# Patient Record
Sex: Male | Born: 1965 | Race: White | Hispanic: No | Marital: Single | Smoking: Former smoker
Health system: Southern US, Community
[De-identification: ages and names within clinical notes are randomized; demographics above are authoritative.]

## PROBLEM LIST (undated history)

## (undated) DIAGNOSIS — R918 Other nonspecific abnormal finding of lung field: Secondary | ICD-10-CM

## (undated) DIAGNOSIS — N529 Male erectile dysfunction, unspecified: Secondary | ICD-10-CM

## (undated) DIAGNOSIS — I712 Thoracic aortic aneurysm, without rupture: Secondary | ICD-10-CM

## (undated) DIAGNOSIS — Z720 Tobacco use: Secondary | ICD-10-CM

## (undated) DIAGNOSIS — R0789 Other chest pain: Secondary | ICD-10-CM

## (undated) DIAGNOSIS — Z973 Presence of spectacles and contact lenses: Secondary | ICD-10-CM

## (undated) DIAGNOSIS — R739 Hyperglycemia, unspecified: Secondary | ICD-10-CM

## (undated) DIAGNOSIS — I7121 Aneurysm of the ascending aorta, without rupture: Secondary | ICD-10-CM

## (undated) DIAGNOSIS — J45909 Unspecified asthma, uncomplicated: Secondary | ICD-10-CM

## (undated) DIAGNOSIS — E7800 Pure hypercholesterolemia, unspecified: Secondary | ICD-10-CM

## (undated) DIAGNOSIS — F419 Anxiety disorder, unspecified: Secondary | ICD-10-CM

## (undated) DIAGNOSIS — Z2821 Immunization not carried out because of patient refusal: Secondary | ICD-10-CM

## (undated) DIAGNOSIS — I10 Essential (primary) hypertension: Secondary | ICD-10-CM

## (undated) DIAGNOSIS — K219 Gastro-esophageal reflux disease without esophagitis: Secondary | ICD-10-CM

## (undated) DIAGNOSIS — E669 Obesity, unspecified: Secondary | ICD-10-CM

## (undated) HISTORY — DX: Hyperglycemia, unspecified: R73.9

## (undated) HISTORY — PX: EYE SURGERY: SHX253

## (undated) HISTORY — DX: Pure hypercholesterolemia, unspecified: E78.00

## (undated) HISTORY — PX: WISDOM TOOTH EXTRACTION: SHX21

## (undated) HISTORY — DX: Thoracic aortic aneurysm, without rupture: I71.2

## (undated) HISTORY — DX: Aneurysm of the ascending aorta, without rupture: I71.21

## (undated) HISTORY — DX: Male erectile dysfunction, unspecified: N52.9

## (undated) HISTORY — DX: Essential (primary) hypertension: I10

## (undated) HISTORY — DX: Unspecified asthma, uncomplicated: J45.909

## (undated) HISTORY — DX: Immunization not carried out because of patient refusal: Z28.21

## (undated) HISTORY — DX: Other chest pain: R07.89

## (undated) HISTORY — DX: Presence of spectacles and contact lenses: Z97.3

## (undated) HISTORY — DX: Obesity, unspecified: E66.9

## (undated) HISTORY — DX: Anxiety disorder, unspecified: F41.9

## (undated) HISTORY — DX: Tobacco use: Z72.0

## (undated) HISTORY — DX: Other nonspecific abnormal finding of lung field: R91.8

---

## 2006-02-09 LAB — HM COLONOSCOPY: HM Colonoscopy: 2

## 2007-11-24 HISTORY — PX: COLONOSCOPY: SHX174

## 2007-11-24 HISTORY — PX: ESOPHAGOGASTRODUODENOSCOPY: SHX1529

## 2008-08-08 ENCOUNTER — Encounter: Admission: RE | Admit: 2008-08-08 | Discharge: 2008-08-08 | Payer: Self-pay | Admitting: Internal Medicine

## 2008-08-08 IMAGING — US US ABDOMEN COMPLETE
1 series · 14 of 25 positions shown · non-contrast
Comparison: none

CLINICAL DATA: LUQ PAIN

ABDOMEN ULTRASOUND
TECHNIQUE: Complete abdominal ultrasound examination was performed
including evaluation of the liver, gallbladder, bile ducts,
pancreas, kidneys, spleen, IVC, and abdominal aorta.

[Series 1: us abdomen complete · 0.29mm/px · 89 acquisitions, 14 frames shown]
[im 1/89]
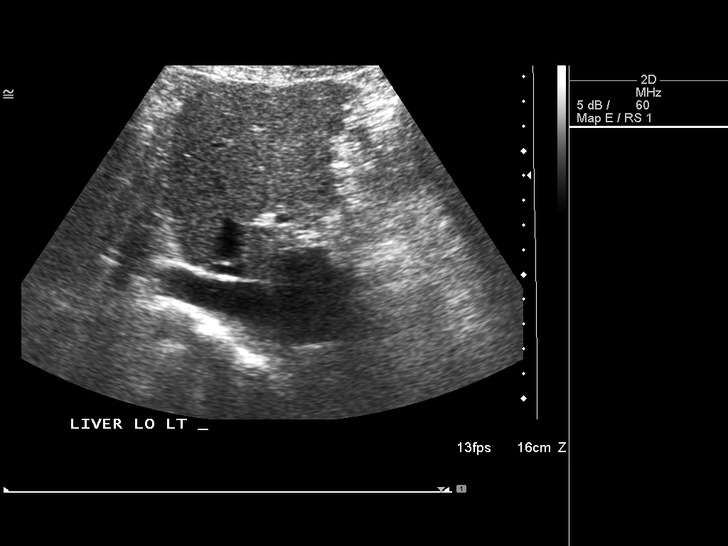
[im 8/89]
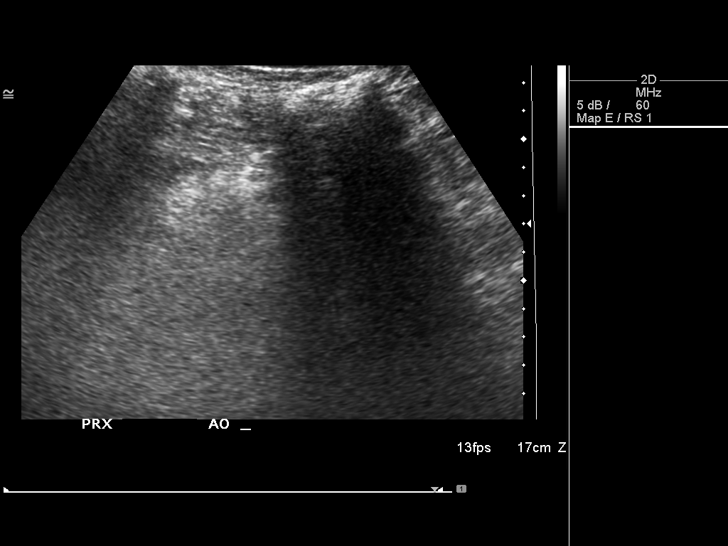
[im 15/89]
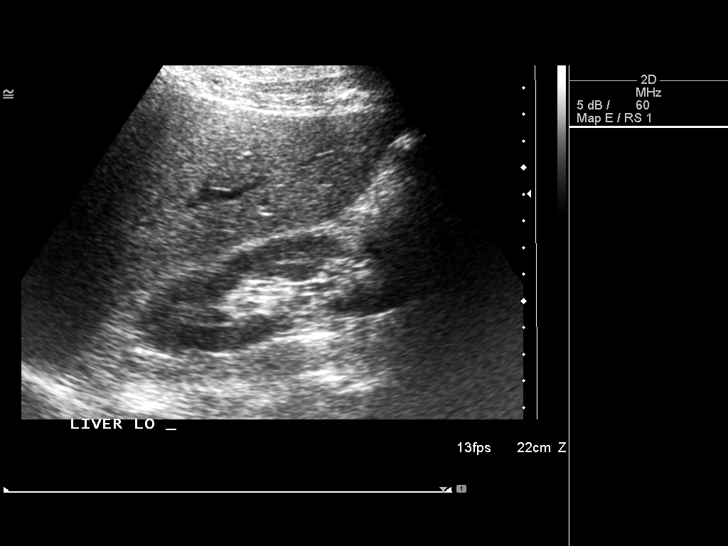
[im 23/89]
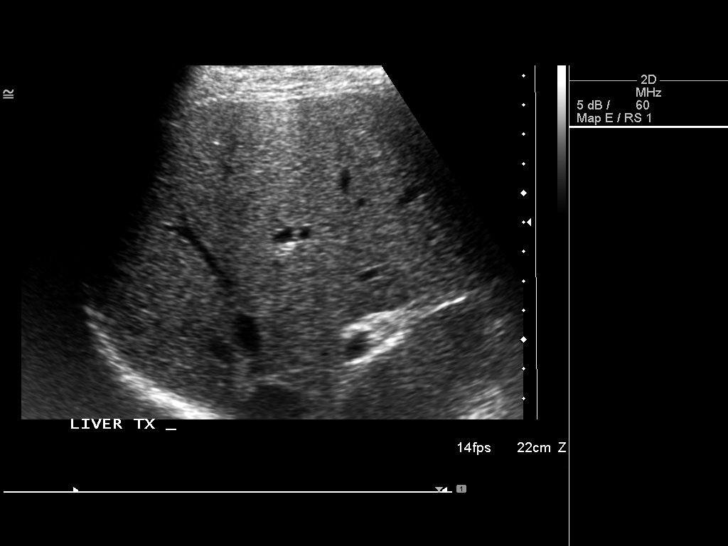
[im 30/89]
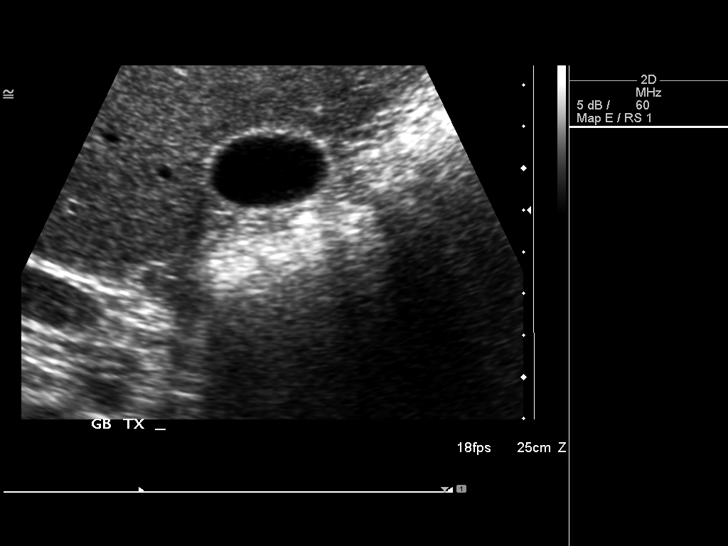
[im 34/89]
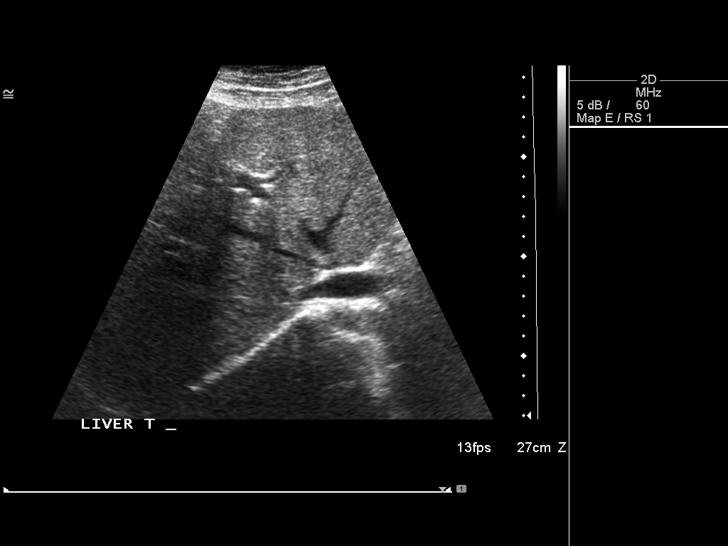
[im 41/89]
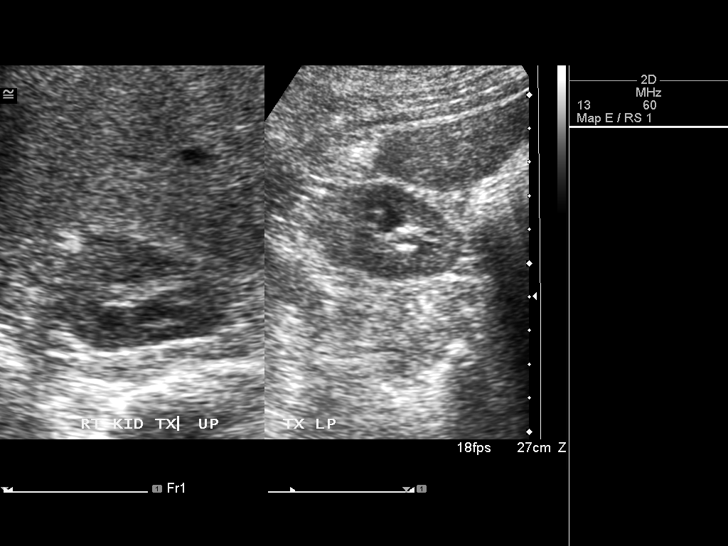
[im 48/89]
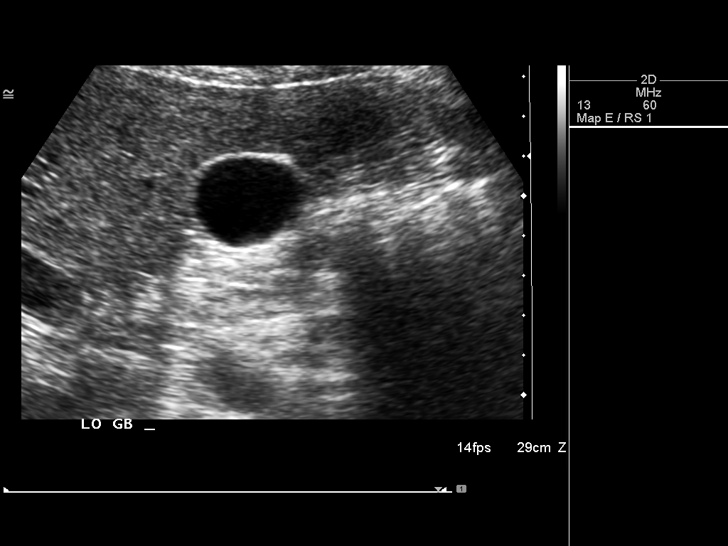
[im 56/89]
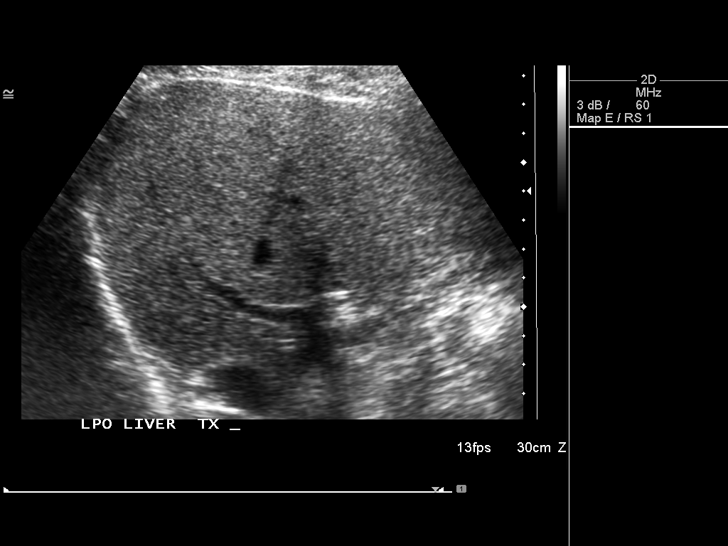
[im 59/89]
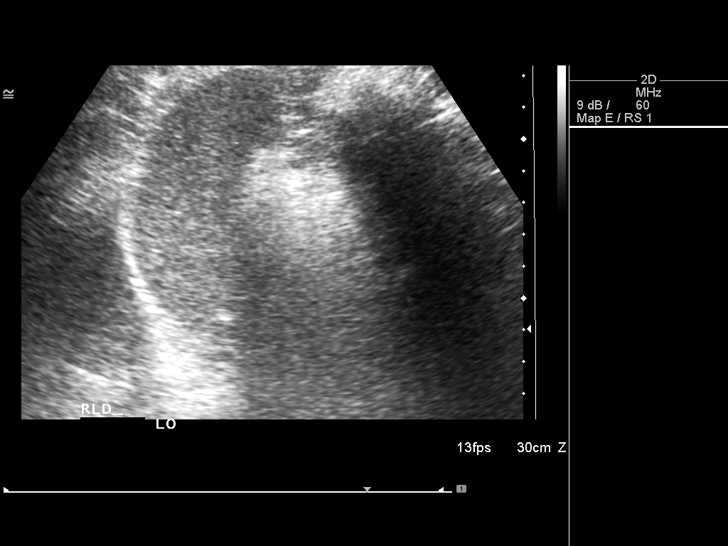
[im 67/89]
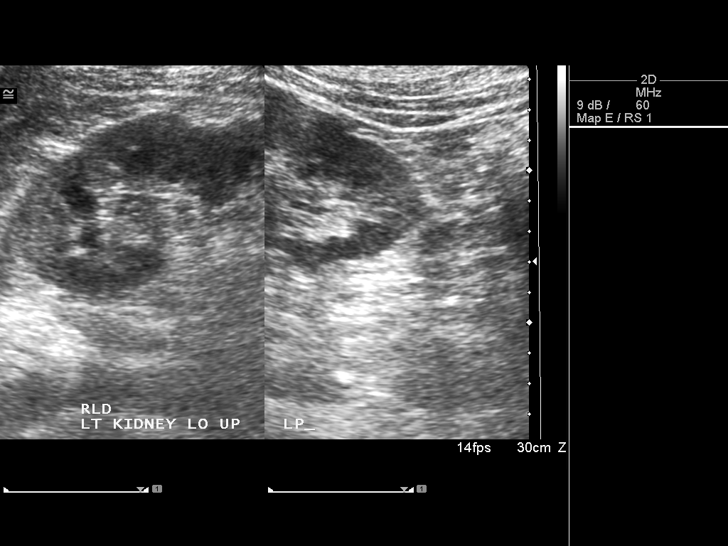
[im 74/89]
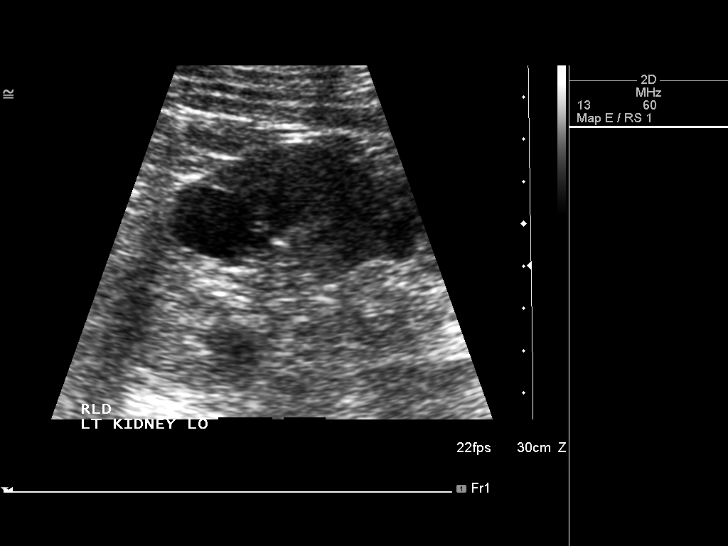
[im 81/89]
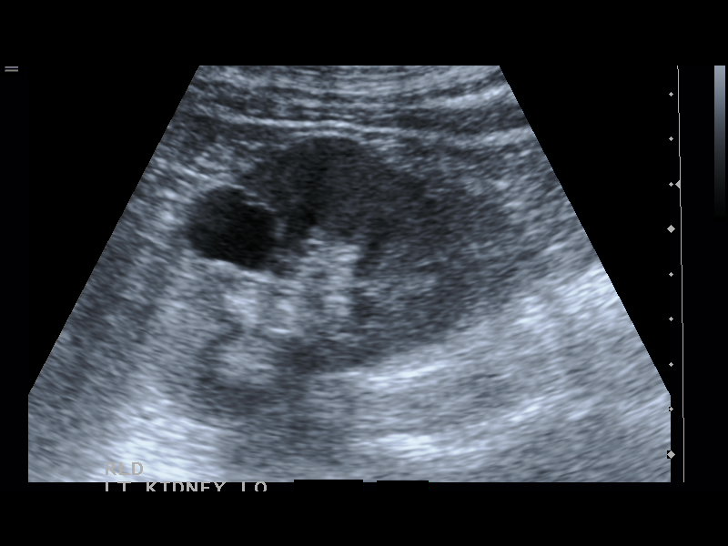
[im 89/89]
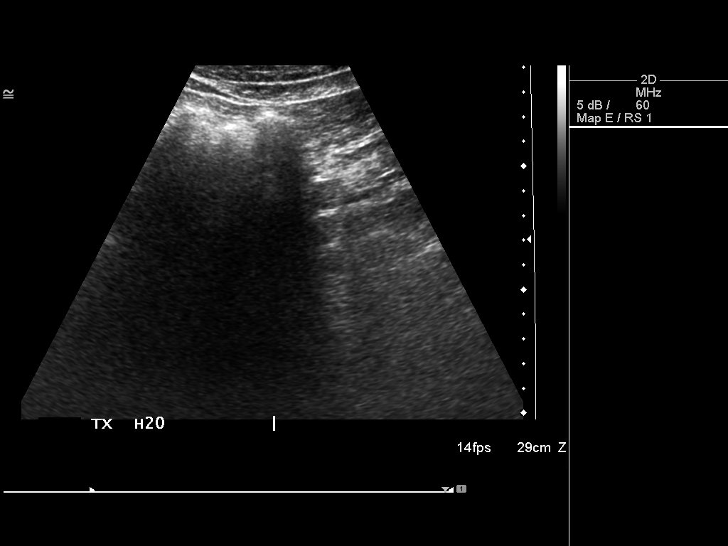

[14 of 25 positions shown; findings below may reference images not displayed]

FINDINGS: Gallbladder:  No gallstones.  No gallbladder wall thickening or
pericholecystic fluid. Negative sonographic Murphy's sign per the
ultrasound technologist.

Common bile duct: Normal in caliber.

Liver:  Normal size and echotexture.  No focal parenchymal
abnormalities.

Inferior vena cava:  Patent.

Pancreas:  Visualized portions unremarkable.

Spleen:  Normal size and echotexture without focal parenchymal
abnormalities.

Right kidney:  No hydronephrosis.   Normal parenchymal echotexture
without focal abnormalities.

Left kidney:  No hydronephrosis. Normal parenchymal echotexture
with a 16 x 19 x 29 mm cyst in its upper pole.

Abdominal aorta:  Visualized portions normal in caliber,
unremarkable..
IMPRESSION: 1.  Left renal cyst.
2.  Otherwise normal abdominal ultrasound.

## 2010-01-30 ENCOUNTER — Ambulatory Visit: Payer: Self-pay | Admitting: Family Medicine

## 2010-01-30 DIAGNOSIS — R112 Nausea with vomiting, unspecified: Secondary | ICD-10-CM

## 2010-01-30 DIAGNOSIS — R197 Diarrhea, unspecified: Secondary | ICD-10-CM | POA: Insufficient documentation

## 2010-04-08 ENCOUNTER — Ambulatory Visit: Payer: Self-pay | Admitting: Family Medicine

## 2010-04-08 DIAGNOSIS — R238 Other skin changes: Secondary | ICD-10-CM

## 2010-12-23 NOTE — Assessment & Plan Note (Signed)
Summary: Bug bite - L calf x 1 dy rm 3   Vital Signs:  Patient Profile:   45 Years Old Male CC:      Bug bite x 1 dy Height:     71 inches Weight:      193 pounds O2 Sat:      100 % O2 treatment:    Room Air Temp:     98.0 degrees F oral Pulse rate:   79 / minute Pulse rhythm:   regular Resp:     16 per minute BP sitting:   149 / 93  (right arm) Cuff size:   regular  Vitals Entered By: Areta Haber CMA (Apr 08, 2010 8:46 AM)                  Current Allergies: No known allergies History of Present Illness Chief Complaint: Bug bite x 1 dy History of Present Illness: Subjective:  Patient noticed an area of bruising on his left mid-calf yesterday without any known preceding trauma.  The area has been very mildly tender but without itching, warmth or swelling. No pain when walking.  On close inspection he noticed two tiny red spots near the center that were suggestive of an insect bite.  However, he recalls no bites, and no tick exposure.  He feels completely well.  He does take low dose aspirin.  He believes that his tetanus immunization is current.  Current Problems: ECCHYMOSES (ICD-782.9) NAUSEA WITH VOMITING (ICD-787.01) DIARRHEA, ACUTE (ICD-787.91)   Current Meds PROTONIX 40 MG SOLR (PANTOPRAZOLE SODIUM) 1 tab by mouth once daily ASPIR-LOW 81 MG TBEC (ASPIRIN)   REVIEW OF SYSTEMS Constitutional Symptoms      Denies fever, chills, night sweats, weight loss, weight gain, and fatigue.  Eyes       Denies change in vision, eye pain, eye discharge, glasses, contact lenses, and eye surgery. Ear/Nose/Throat/Mouth       Denies hearing loss/aids, change in hearing, ear pain, ear discharge, dizziness, frequent runny nose, frequent nose bleeds, sinus problems, sore throat, hoarseness, and tooth pain or bleeding.  Respiratory       Denies dry cough, productive cough, wheezing, shortness of breath, asthma, bronchitis, and emphysema/COPD.  Cardiovascular       Denies murmurs,  chest pain, and tires easily with exhertion.    Gastrointestinal       Denies stomach pain, nausea/vomiting, diarrhea, constipation, blood in bowel movements, and indigestion. Genitourniary       Denies painful urination, kidney stones, and loss of urinary control. Neurological       Denies paralysis, seizures, and fainting/blackouts. Musculoskeletal       Denies muscle pain, joint pain, joint stiffness, decreased range of motion, redness, swelling, muscle weakness, and gout.  Skin       Complains of bruising.      Denies unusual mles/lumps or sores and hair/skin or nail changes.  Psych       Denies mood changes, temper/anger issues, anxiety/stress, speech problems, depression, and sleep problems. Other Comments: Bug Bite L Calf x 1 dy.   Past History:  Past Medical History: Last updated: 01/30/2010 Unremarkable  Past Surgical History: Last updated: 01/30/2010 Denies surgical history  Social History: Last updated: 01/30/2010 Single Current Smoker -  Alcohol use-no Drug use-no Regular exercise-no  Risk Factors: Exercise: no (01/30/2010)  Risk Factors: Smoking Status: current (01/30/2010)   Objective:  Appearance:  Patient appears healthy, stated age, and in no acute distress  Neck:  Supple.  No adenopathy is present.   Lungs:  Clear to auscultation.  Breath sounds are equal.  Heart:  Regular rate and rhythm without murmurs, rubs, or gallops.  Abdomen:  Nontender without masses or hepatosplenomegaly.  Bowel sounds are present.  No CVA or flank tenderness.  Extremities:  No edema.  Pedal pulses are full and equal.  Left mid-calf medially reveals an area of very faint ecchymosis about 10cm dia.  Near the center of the ecchymosis are two tiny (1- 2mm) erythematous macules about 3mm apart suggestive of a recent insect bite.  Immediately around these two spots are mild tenderness and swelling, but no erythema or warmth.  No drainage.  No generalized calf tenderness or  swelling.  Homan's test negative. Assessment New Problems: ECCHYMOSES (ICD-782.9)  SUSPECT MINIOR INSECT BITE WITH LOCAL TOXIN EFFECT.   ECCHYMOSIS AND SWELLING INTENSIFIED BY PATIENT'S ASPIRIN USE.  NO EVIDENCE OF CELLULITIS  Plan New Orders: Est. Patient Level III [56213] Planning Comments:   Reassurance.  May apply Bacitracin to small lesions until healed.  May apply heating pad two or three times daily. Return for follow-up if develops systemic symptoms (fever, headache, arthralgias, malaise, etc) or signs of local infection:  redness, heat, increased pain, etc.   The patient and/or caregiver has been counseled thoroughly with regard to medications prescribed including dosage, schedule, interactions, rationale for use, and possible side effects and they verbalize understanding.  Diagnoses and expected course of recovery discussed and will return if not improved as expected or if the condition worsens. Patient and/or caregiver verbalized understanding.

## 2010-12-23 NOTE — Letter (Signed)
Summary: Out of Work  [facility]  [address]   [place], [place] [address]   Phone: [phone]  Fax: [phone]    January 30, 2010   Employee:  Isahia Pillard    To Whom It May Concern:   For Medical reasons, please excuse the above named employee from work for the following dates:  Start:   30 Jan 2010  End:   01 Feb 2010  If you need additional information, please feel free to contact our office.         Sincerely,    Marvis Moeller DO

## 2010-12-23 NOTE — Assessment & Plan Note (Signed)
Summary: VOMITING/KH   Vital Signs:  Patient Profile:   45 Years Old Male CC:      Vomitin, naseau, dizzy x today Height:     71 inches Weight:      188 pounds O2 Sat:      98 % O2 treatment:    Room Air Temp:     97.1 degrees F oral Pulse rate:   83 / minute Pulse (ortho):   87 / minute Pulse rhythm:   regular Resp:     18 per minute BP sitting:   112 / 67  (right arm) BP standing:   104 / 62 Cuff size:   regular  Vitals Entered By: Areta Haber CMA (January 30, 2010 3:25 PM)                 Serial Vital Signs/Assessments:  Time      Position  BP       Pulse  Resp  Temp     By           Lying RA  94/55    88                    Areta Haber CMA           Sitting   112/72   80                    Areta Haber CMA           Standing  104/62   87                    [address] CMA    Current Allergies: No known allergies History of Present Illness Chief Complaint: Vomitin, naseau, dizzy x today History of Present Illness: ONSET 7 AM N/V. NO FEVER. NO BLOOD IN STOOL OR EMISIS. WAS EXPOSED TO SIMILAR SYMPTOMS YESTERDAY BY CO WORKERS.   Current Problems: NAUSEA WITH VOMITING (ICD-787.01) DIARRHEA, ACUTE (ICD-787.91)   Current Meds ZOFRAN 4 MG TABS (ONDANSETRON HCL) 1 TAB Q 6-8 HRS as needed N/V  REVIEW OF SYSTEMS Constitutional Symptoms      Denies fever, chills, night sweats, weight loss, weight gain, and fatigue.  Eyes       Denies change in vision, eye pain, eye discharge, glasses, contact lenses, and eye surgery. Ear/Nose/Throat/Mouth       Complains of dizziness.      Denies hearing loss/aids, change in hearing, ear pain, ear discharge, frequent runny nose, frequent nose bleeds, sinus problems, sore throat, hoarseness, and tooth pain or bleeding.      Comments: x today Respiratory       Denies dry cough, productive cough, wheezing, shortness of breath, asthma, bronchitis, and emphysema/COPD.  Cardiovascular       Denies murmurs, chest pain,  and tires easily with exhertion.    Gastrointestinal       Complains of nausea/vomiting and diarrhea.      Denies stomach pain, constipation, blood in bowel movements, and indigestion. Genitourniary       Denies painful urination, kidney stones, and loss of urinary control. Neurological       Denies paralysis, seizures, and fainting/blackouts. Musculoskeletal       Denies muscle pain, joint pain, joint stiffness, decreased range of motion, redness, swelling, muscle weakness, and gout.  Skin       Denies bruising, unusual mles/lumps or sores, and hair/skin or nail changes.  Psych  Denies mood changes, temper/anger issues, anxiety/stress, speech problems, depression, and sleep problems.  Past History:  Past Medical History: Unremarkable  Past Surgical History: Denies surgical history  Social History: Single Current Smoker -  Alcohol use-no Drug use-no Regular exercise-no Smoking Status:  current Drug Use:  no Does Patient Exercise:  no Physical Exam General appearance: well developed, well nourished, no acute distress Oral/Pharynx: tongue normal, posterior pharynx without erythema or exudate Neck: neck supple,  trachea midline, no masses Chest/Lungs: no rales, wheezes, or rhonchi bilateral, breath sounds equal without effort Heart: regular rate and  rhythm, no murmur Abdomen: soft, non-tender without obvious organomegaly BS HYPERACTIVE Extremities: normal extremities Skin: no obvious rashes or lesions ORTHO VS NEG Assessment New Problems: NAUSEA WITH VOMITING (ICD-787.01) DIARRHEA, ACUTE (ICD-787.91)   Plan New Medications/Changes: ZOFRAN 4 MG TABS (ONDANSETRON HCL) 1 TAB Q 6-8 HRS as needed N/V  ##8 x 0, 01/30/2010, Marvis Moeller DO  New Orders: New Patient Level III [99203] Zofran 1mg . injection [J2405] Admin of Therapeutic Inj  intramuscular or subcutaneous [96372]   Prescriptions: ZOFRAN 4 MG TABS (ONDANSETRON HCL) 1 TAB Q 6-8 HRS as needed N/V  ##8  x 0   Entered and Authorized by:   Marvis Moeller DO   Signed by:   Marvis Moeller DO on 01/30/2010   Method used:   Print then Give to Patient   RxID:   [id number]   Patient Instructions: 1)  CLEAR LIQUIDS . AVOID CAFFEINE AND MILK PRODUCTS. DIET AS DISCUSSED. REST. CONTINUE FLUIDS AND DIET UNTIL FORM TO YOUR STOOL.   Medication Administration  Injection # 1:    Medication: Zofran 1mg . injection    Diagnosis: NAUSEA WITH VOMITING (ICD-787.01)    Route: IM    Site: RUOQ gluteus    Exp Date: 06/24/2011    Lot #: [id number]    Mfr: Hospira    Comments: Administered 4mg     Patient tolerated injection without complications    Given by: Areta Haber CMA (January 30, 2010 5:41 PM)  Orders Added: 1)  New Patient Level III [99203] 2)  Zofran 1mg . injection [J2405] 3)  Admin of Therapeutic Inj  intramuscular or subcutaneous [14782]

## 2011-10-22 DIAGNOSIS — H5010 Unspecified exotropia: Secondary | ICD-10-CM | POA: Insufficient documentation

## 2011-10-22 DIAGNOSIS — H52209 Unspecified astigmatism, unspecified eye: Secondary | ICD-10-CM | POA: Insufficient documentation

## 2011-10-22 DIAGNOSIS — H02409 Unspecified ptosis of unspecified eyelid: Secondary | ICD-10-CM | POA: Insufficient documentation

## 2011-11-09 ENCOUNTER — Emergency Department (INDEPENDENT_AMBULATORY_CARE_PROVIDER_SITE_OTHER)
Admission: EM | Admit: 2011-11-09 | Discharge: 2011-11-09 | Disposition: A | Discharge status: Home / Self Care | Payer: BC Managed Care – PPO | Source: Home / Self Care | Attending: Emergency Medicine | Admitting: Emergency Medicine

## 2011-11-09 ENCOUNTER — Encounter: Payer: Self-pay | Admitting: *Deleted

## 2011-11-09 DIAGNOSIS — J0100 Acute maxillary sinusitis, unspecified: Secondary | ICD-10-CM

## 2011-11-09 HISTORY — DX: Gastro-esophageal reflux disease without esophagitis: K21.9

## 2011-11-09 MED ORDER — AMOXICILLIN 500 MG PO CAPS: 500.0000 mg | ORAL_CAPSULE | Freq: Three times a day (TID) | ORAL | Status: AC

## 2011-11-09 MED ORDER — FLUTICASONE PROPIONATE 50 MCG/ACT NA SUSP: NASAL | Status: DC

## 2011-11-09 NOTE — ED Provider Notes (Signed)
History     CSN: [id number] Arrival date & time: 11/09/2011 12:49 PM   First MD Initiated Contact with Patient 11/09/11 1305      Chief Complaint  Patient presents with  . Nasal Congestion  . Headache    (Consider location/radiation/quality/duration/timing/severity/associated sxs/prior treatment) HPI Comments: Cody Tapia is a 45 y.o. male who complains of onset of cold symptoms for 7 days.  They are using otc meds which helps a little bit. Pt c/o nasal congestion, headache, and fatigue x 1wk. Denies fever. He has taken Mucinex and vits.   No sore throat Rare cough No pleuritic pain No wheezing Positive, severe nasal congestion Positive post-nasal drainage, yellow rhinorrhea, rarely blood-tinged Past sinus pain/pressure No chest congestion No itchy/red eyes Positive for ear fullness bilaterally without actual ear pain. No hemoptysis No SOB No chills/sweats Positive low grade fever No nausea No vomiting No abdominal pain No diarrhea No skin rashes No fatigue No myalgias Positive mild sinus headache   Patient is a 45 y.o. male presenting with headaches. The history is provided by the patient.  Headache The primary symptoms include headaches (mild).    Past Medical History  Diagnosis Date  . GERD (gastroesophageal reflux disease)     Past Surgical History  Procedure Date  . Eye surgery   . Wisdom tooth extraction     Family History  Problem Relation Age of Onset  . Heart failure Father     History  Substance Use Topics  . Smoking status: Current Everyday Smoker  . Smokeless tobacco: Not on file  . Alcohol Use: No      Review of Systems  Neurological: Positive for headaches (mild).  All other systems reviewed and are negative.    Allergies  Review of patient's allergies indicates no known allergies.  Home Medications   Current Outpatient Rx  Name Route Sig Dispense Refill  . PANTOPRAZOLE SODIUM 40 MG PO TBEC Oral Take 40 mg by mouth daily.         BP 143/87  Pulse 79  Temp(Src) 98.3 F (36.8 C) (Oral)  Resp 16  Ht 5\' 11"  (1.803 m)  Wt 206 lb (93.441 kg)  BMI 28.73 kg/m2  SpO2 97%  Physical Exam  Nursing note and vitals reviewed. Constitutional: He is oriented to person, place, and time. He appears well-developed and well-nourished. No distress.  HENT:  Head: Normocephalic and atraumatic.  Right Ear: Tympanic membrane, external ear and ear canal normal.  Left Ear: Tympanic membrane, external ear and ear canal normal.  Nose: Mucosal edema and rhinorrhea present. Right sinus exhibits maxillary sinus tenderness. Left sinus exhibits maxillary sinus tenderness.  Mouth/Throat: Oropharynx is clear and moist. No oral lesions. No oropharyngeal exudate.  Eyes: Right eye exhibits no discharge. Left eye exhibits no discharge. No scleral icterus.  Neck: Neck supple.  Cardiovascular: Normal rate, regular rhythm and normal heart sounds.   Pulmonary/Chest: Effort normal and breath sounds normal. He has no wheezes. He has no rales.  Lymphadenopathy:    He has no cervical adenopathy.  Neurological: He is alert and oriented to person, place, and time.  Skin: Skin is warm and dry.    ED Course  Procedures (including critical care time)        MDM  Acute maxillary sinusitis. Amoxicillin, Flonase prescribed. Other symptomatic care discussed, recheck when necessary. Followup with PCP 7-10 days if no better, sooner if worse.        Lonell Face, MD 11/09/11 1324

## 2011-11-09 NOTE — ED Notes (Signed)
Pt c/o nasal congestion, headache, and fatigue x 1wk. Denies fever. He has taken Mucinex and vits.

## 2011-12-17 ENCOUNTER — Ambulatory Visit: Payer: BC Managed Care – PPO | Admitting: Family Medicine

## 2012-01-26 ENCOUNTER — Ambulatory Visit: Payer: BC Managed Care – PPO | Admitting: Family Medicine

## 2012-02-29 ENCOUNTER — Ambulatory Visit (INDEPENDENT_AMBULATORY_CARE_PROVIDER_SITE_OTHER): Payer: BC Managed Care – PPO | Admitting: Family Medicine

## 2012-02-29 ENCOUNTER — Encounter: Payer: Self-pay | Admitting: Family Medicine

## 2012-02-29 VITALS — BP 141/89 | HR 75 | Temp 98.2°F | Ht 71.0 in | Wt 200.0 lb

## 2012-02-29 DIAGNOSIS — M79605 Pain in left leg: Secondary | ICD-10-CM

## 2012-02-29 DIAGNOSIS — M79609 Pain in unspecified limb: Secondary | ICD-10-CM

## 2012-02-29 NOTE — Patient Instructions (Signed)
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 In about 10% of people the sciatic nerve runs through the piriformis muscle - others it's very close to this so when it spasms you can get pain going down past your knee. Try to avoid painful activities when possible (usually hills, stairs). If pain is worse than a 3 out of 10, stop activity. Tennis ball to massage area when sitting Pick 2 stretches where you feel the pull in the area of pain - do 3 of these and hold for 30 seconds at least once a day Tylenol and/or aleve as needed for pain. Consider physical therapy.  Call me in 2-3 weeks if your home program isn't helping sufficiently and we can add this. The other possibility is lumbar radiculopathy (an irritated nerve in your low back) from a bulging disc. Follow up with me in 5-6 weeks otherwise.

## 2012-03-01 ENCOUNTER — Encounter: Payer: Self-pay | Admitting: Family Medicine

## 2012-03-01 DIAGNOSIS — M79605 Pain in left leg: Secondary | ICD-10-CM | POA: Insufficient documentation

## 2012-03-01 NOTE — Assessment & Plan Note (Signed)
likely 2/2 piriformis syndrome with sciatica, less likely lumbar radiculopathy.  Start with home stretching and exercise program.  Tennis ball massage, tylenol/aleve as needed.  He would like to try home program first before PT - consider adding this if not improving over next few weeks as expected.  Lumbar spine workup a consideration if not improving with HEP and PT.  See instructions for further.

## 2012-03-01 NOTE — Progress Notes (Signed)
  Subjective:    Patient ID: Cody Tapia, male    DOB: Nov 16, 1966, 46 y.o.   MRN: [id number]  PCP: Dr. Allie Bossier  HPI 46 yo M here for left leg pain.  Patient denies known injury. States over past 3 weeks he's had slowly worsening left posterior leg pain starting in buttock and going down posterior leg past knee. No back pain with this. Has prior h/o back issues but this feels differently. No numbness, tingling, bowel/bladder dysfunction. Worse in AM and eases up during day. No night pain. Has tried chiropractic care, acupuncture. Does elliptical and weightlifting at gym.  Past Medical History  Diagnosis Date  . GERD (gastroesophageal reflux disease)     Current Outpatient Prescriptions on File Prior to Visit  Medication Sig Dispense Refill  . fluticasone (FLONASE) 50 MCG/ACT nasal spray 1 or 2 sprays each nostril twice a day  16 g  0  . pantoprazole (PROTONIX) 40 MG tablet Take 40 mg by mouth daily.          Past Surgical History  Procedure Date  . Eye surgery   . Wisdom tooth extraction     No Known Allergies  History   Social History  . Marital Status: Single    Spouse Name: N/A    Number of Children: N/A  . Years of Education: N/A   Occupational History  . Not on file.   Social History Main Topics  . Smoking status: Former Games developer  . Smokeless tobacco: Not on file   Comment: Quit 1 month ago  . Alcohol Use: No  . Drug Use: No  . Sexually Active: Not on file   Other Topics Concern  . Not on file   Social History Narrative  . No narrative on file    Family History  Problem Relation Age of Onset  . Heart failure Father   . Sudden death Neg Hx   . Heart attack Neg Hx   . Hyperlipidemia Neg Hx   . Diabetes Neg Hx   . Hypertension Neg Hx     BP 141/89  Pulse 75  Temp(Src) 98.2 F (36.8 C) (Oral)  Ht 5\' 11"  (1.803 m)  Wt 200 lb (90.719 kg)  BMI 27.89 kg/m2 Review of Systems See HPI above.    Objective:   Physical Exam Gen:  NAD  Back: No gross deformity, scoliosis. TTP at piriformis.  No midline or bony TTP. FROM with mild pain on extension within left buttock, post leg. Strength LEs 5/5 all muscle groups.  2+ MSRs in patellar, left achilles tendons, 1+ right achilles. Negative SLRs. Sensation intact to light touch bilaterally. Negative logroll bilateral hips Pain left buttock with right fabers and with left piriformis stretches.   No leg length inequality.    Assessment & Plan:  1. Left leg pain - likely 2/2 piriformis syndrome with sciatica, less likely lumbar radiculopathy.  Start with home stretching and exercise program.  Tennis ball massage, tylenol/aleve as needed.  He would like to try home program first before PT - consider adding this if not improving over next few weeks as expected.  Lumbar spine workup a consideration if not improving with HEP and PT.  See instructions for further.

## 2012-04-25 ENCOUNTER — Institutional Professional Consult (permissible substitution): Payer: BC Managed Care – PPO | Admitting: Medical

## 2012-05-03 ENCOUNTER — Encounter: Payer: Self-pay | Admitting: Medical

## 2012-05-03 ENCOUNTER — Ambulatory Visit (INDEPENDENT_AMBULATORY_CARE_PROVIDER_SITE_OTHER): Payer: BC Managed Care – PPO | Admitting: Medical

## 2012-05-03 VITALS — BP 130/90 | HR 80 | Temp 98.1°F | Resp 16 | Ht 71.0 in | Wt 208.0 lb

## 2012-05-03 DIAGNOSIS — N529 Male erectile dysfunction, unspecified: Secondary | ICD-10-CM

## 2012-05-03 DIAGNOSIS — F17200 Nicotine dependence, unspecified, uncomplicated: Secondary | ICD-10-CM

## 2012-05-03 DIAGNOSIS — K219 Gastro-esophageal reflux disease without esophagitis: Secondary | ICD-10-CM | POA: Insufficient documentation

## 2012-05-03 DIAGNOSIS — E663 Overweight: Secondary | ICD-10-CM | POA: Insufficient documentation

## 2012-05-03 MED ORDER — NICOTINE 10 MG IN INHA: 2.0000 | RESPIRATORY_TRACT | Status: AC | PRN

## 2012-05-03 MED ORDER — BUPROPION HCL ER (XL) 150 MG PO TB24: 150.0000 mg | ORAL_TABLET | Freq: Every day | ORAL | Status: DC

## 2012-05-03 NOTE — Progress Notes (Signed)
Subjective:   HPI  Cody Tapia is a 46 y.o. male who presents as a new patient today with multiple concerns.  Was seeing provider in [place], [place] prior.    He wants to establish care.  He wants help with smoking cessation.  He has been using Chantix for a few weeks, has taken prior in the past without improvement.  Wants to pursue other options.  He has used Nicotrol inhaler prior.   Has also tried nicotine gum, not sure about other options.  Doesn't want to go cold [place].  He has heard about Wellbutrin and doesn't want to use this.    He reports weight gain, 10-15lb in the winter, not able to get this off yet.  Has used Phentermine in the past.   Needs a boost to help with weight loss.  He exercises.  He avoid soda and sweet tea.  He eats lots of vegetables and fruits.  No weight loss in 68mo.  Frustrated that he can't lose weight.   He does note hx/o anxiety in the past.  Doesn't want to take medication that will make him numb.  Father has had to have cardiac stents placed recently.  He has high stress on the job which relates to his anxiety.  He reports that his cialis helps his anxiety.  Has been on Paxil and Prozac in the post.   He attributes a lot of his prior anxiety to bad relationship, but after his divorce 10 years ago, things got better.    Hx/o ED, uses Cialis, gets #8 at a time through insurer, this lasts about a month.    Uses Protonix for GERD, has had this as long as he can remember.  Has had prior EGD and Colonoscopy 5-6 years ago normal.  Had this done for LUQ pain.  Mother has same issue, on Nexium.   No other aggravating or relieving factors.    No other c/o.  The following portions of the patient's history were reviewed and updated as appropriate: allergies, current medications, past family history, past medical history, past social history, past surgical history and problem list.  Past Medical History  Diagnosis Date  . GERD (gastroesophageal reflux disease)   .  Tobacco use   . ED (erectile dysfunction)   . Anxiety     prior therapy, in remission    No Known Allergies   Review of Systems ROS reviewed and was negative other than noted in HPI or above.    Objective:   Physical Exam  General appearance: alert, no distress, WD/WN Oral cavity: MMM, no lesions Neck: supple, no lymphadenopathy, no thyromegaly, no masses Heart: RRR, normal S1, S2, no murmurs Lungs: CTA bilaterally, no wheezes, rhonchi, or rales Abdomen: +bs, soft, non tender, non distended, no masses, no hepatomegaly, no splenomegaly Pulses: 2+ symmetric, upper and lower extremities, normal cap refill   Assessment and Plan :     Encounter Diagnoses  Name Primary?  . Tobacco use disorder Yes  . Overweight   . GERD (gastroesophageal reflux disease)   . ED (erectile dysfunction)    Tobacco use disorder - discussed his concerns, discussed options for treatment.   He has not done well on Chantix but has done ok on Nicotrol prior.  Will begin combination of Wellbutrin, Nicotrol inhaler, he will purse counseling, and handout given including info on 1-800-QUIT-NOW.  Recheck 67mo.  Overweight - long discussion about strategies to help with weight loss including diet, calorie tracker with Iphone App, exercise  recommendations, consider personal trainer, but advised against phentermine or similar drugs.    GERD - controlled on Protonix and diet.  ED - does well with Cialis, no c/o.   RTC 25mo.

## 2012-05-03 NOTE — Patient Instructions (Signed)
YOU CAN QUIT SMOKING!  Talk to your medical provider about using medicines to help you quit. These include nicotine replacement gum, lozenges, or skin patches.  Consider calling 1-800-QUIT-NOW, a toll free 24/7 hotline with free counseling to help you quit.  If you are ready to quit smoking or are thinking about it, congratulations! You have chosen to help yourself be healthier and live longer! There are lots of different ways to quit smoking. Nicotine gum, nicotine patches, a nicotine inhaler, or nicotine nasal spray can help with physical craving. Hypnosis, support groups, and medicines help break the habit of smoking. TIPS TO GET OFF AND STAY OFF CIGARETTES  Learn to predict your moods. Do not let a bad situation be your excuse to have a cigarette. Some situations in your life might tempt you to have a cigarette.   Ask friends and co-workers not to smoke around you.   Make your home smoke-free.   Never have "just one" cigarette. It leads to wanting another and another. Remind yourself of your decision to quit.   On a card, make a list of your reasons for not smoking. Read it at least the same number of times a day as you have a cigarette. Tell yourself everyday, "I do not want to smoke. I choose not to smoke."   Ask someone at home or work to help you with your plan to quit smoking.   Have something planned after you eat or have a cup of coffee. Take a walk or get other exercise to perk you up. This will help to keep you from overeating.   Try a relaxation exercise to calm you down and decrease your stress. Remember, you may be tense and nervous the first two weeks after you quit. This will pass.   Find new activities to keep your hands busy. Play with a pen, coin, or rubber band. Doodle or draw things on paper.   Brush your teeth right after eating. This will help cut down the craving for the taste of tobacco after meals. You can try mouthwash too.   Try gum, breath mints, or diet  candy to keep something in your mouth.  IF YOU SMOKE AND WANT TO QUIT:  Do not stock up on cigarettes. Never buy a carton. Wait until one pack is finished before you buy another.   Never carry cigarettes with you at work or at home.   Keep cigarettes as far away from you as possible. Leave them with someone else.   Never carry matches or a lighter with you.   Ask yourself, "Do I need this cigarette or is this just a reflex?"   Bet with someone that you can quit. Put cigarette money in a piggy bank every morning. If you smoke, you give up the money. If you do not smoke, by the end of the week, you keep the money.   Keep trying. It takes 21 days to change a habit!  Document Released: 09/05/2009 Document Revised: 07/22/2011 Document Reviewed: 09/05/2009 [employer] Patient Information 2012 [employer].   

## 2012-08-16 ENCOUNTER — Ambulatory Visit (INDEPENDENT_AMBULATORY_CARE_PROVIDER_SITE_OTHER): Payer: BC Managed Care – PPO | Admitting: Medical

## 2012-08-16 ENCOUNTER — Encounter: Payer: Self-pay | Admitting: Medical

## 2012-08-16 VITALS — BP 122/80 | HR 68 | Temp 98.2°F | Resp 16 | Wt 212.0 lb

## 2012-08-16 DIAGNOSIS — F17200 Nicotine dependence, unspecified, uncomplicated: Secondary | ICD-10-CM

## 2012-08-16 DIAGNOSIS — J40 Bronchitis, not specified as acute or chronic: Secondary | ICD-10-CM

## 2012-08-16 DIAGNOSIS — N529 Male erectile dysfunction, unspecified: Secondary | ICD-10-CM

## 2012-08-16 DIAGNOSIS — R062 Wheezing: Secondary | ICD-10-CM

## 2012-08-16 DIAGNOSIS — K219 Gastro-esophageal reflux disease without esophagitis: Secondary | ICD-10-CM

## 2012-08-16 MED ORDER — PANTOPRAZOLE SODIUM 40 MG PO TBEC: 40.0000 mg | DELAYED_RELEASE_TABLET | Freq: Every day | ORAL | Status: DC

## 2012-08-16 MED ORDER — AMOXICILLIN 875 MG PO TABS: 875.0000 mg | ORAL_TABLET | Freq: Two times a day (BID) | ORAL | Status: DC

## 2012-08-16 MED ORDER — TADALAFIL 20 MG PO TABS: 20.0000 mg | ORAL_TABLET | Freq: Every day | ORAL | Status: DC | PRN

## 2012-08-16 MED ORDER — ALBUTEROL SULFATE HFA 108 (90 BASE) MCG/ACT IN AERS: 2.0000 | INHALATION_SPRAY | Freq: Four times a day (QID) | RESPIRATORY_TRACT | Status: DC | PRN

## 2012-08-16 NOTE — Progress Notes (Signed)
Subjective:  Cody Tapia is a 46 y.o. male who presents for evaluation cough.  About a month ago had 2 wk of sinus pressure, cough, phlegm, felt fatigue and yucky.  Eventually got over this, felt good 1 wk, then girlfriend got sick and now he has illness again.  Started out like a cold, with sneezing, tired, congestion.  Now it has settled back in chest, coughing up phlegm, feels drainage.  Feels a little better today than last 2 days, but still has congestion and cough lingering.  Some head congestion, but worse in chest.  Using some mucinex with some relief.  No other aggravating or relieving factors.    He saw me as a new patient recently, needs refills on cialis and protonix.  He tried to quit tobacco with beginning nicotrol inhaler and Wellbutrin, but couldn't tolerate Wellbutrin.  He is still trying to cut down.  No other c/o.  Past Medical History  Diagnosis Date  . GERD (gastroesophageal reflux disease)   . Tobacco use   . ED (erectile dysfunction)   . Anxiety     prior therapy, in remission   The following portions of the patient's history were reviewed and updated as appropriate: allergies, current medications, past family history, past medical history, past social history, past surgical history and problem list.   Objective:   Filed Vitals:   08/16/12 1335  BP: 122/80  Pulse: 68  Temp: 98.2 F (36.8 C)  Resp: 16    General appearance: Alert, WD/WN, no distress, ill appearing                             Skin: warm, no rash, no diaphoresis                           Head: no sinus tenderness                            Eyes: conjunctiva normal, corneas clear, PERRLA                            Ears: pearly TMs, external ear canals normal                          Nose: septum midline, turbinates swollen, with erythema and clear discharge             Mouth/throat: MMM, tongue normal, mild pharyngeal erythema                           Neck: supple, no adenopathy, no  thyromegaly, nontender                          Heart: RRR, normal S1, S2, no murmurs                         Lungs: +bronchial breath sounds, +scattered rhonchi, +few wheezes, no rales                Extremities: no edema, nontender     Assessment and Plan:   Encounter Diagnoses  Name Primary?  . Bronchitis Yes  . ED (erectile dysfunction)   . GERD (gastroesophageal reflux disease)   .  Tobacco use disorder    Bronchitis - Prescription given today for amoxicillin, and albuterol inhaler as below.  Discussed diagnosis and treatment of bronchitis.  Suggested symptomatic OTC remedies for cough and congestion.  Call/return in 2-3 days if symptoms are worse or not improving.    ED - refills on Cialis, discussed proper use, possible adverse effects  GERD - does well on Protonix, refills today  Tobacco use disorder - c/t efforts to stop and can c/t Nicotrol inhaler

## 2013-02-19 ENCOUNTER — Other Ambulatory Visit: Payer: Self-pay | Admitting: Medical

## 2013-04-20 ENCOUNTER — Other Ambulatory Visit: Payer: Self-pay | Admitting: Medical

## 2013-04-20 NOTE — Telephone Encounter (Signed)
Is this ok?

## 2013-04-21 NOTE — Telephone Encounter (Signed)
I called and I left a message for the patient. CLS

## 2013-04-21 NOTE — Telephone Encounter (Signed)
I sent refill on Cialis.  Lets set him up for physical and fasting labs

## 2013-05-22 ENCOUNTER — Encounter: Payer: Self-pay | Admitting: Medical

## 2013-05-23 DIAGNOSIS — Z2821 Immunization not carried out because of patient refusal: Secondary | ICD-10-CM

## 2013-05-23 HISTORY — DX: Immunization not carried out because of patient refusal: Z28.21

## 2013-06-21 ENCOUNTER — Ambulatory Visit (INDEPENDENT_AMBULATORY_CARE_PROVIDER_SITE_OTHER): Payer: BC Managed Care – PPO | Admitting: Medical

## 2013-06-21 ENCOUNTER — Encounter: Payer: Self-pay | Admitting: Medical

## 2013-06-21 VITALS — BP 130/90 | HR 72 | Temp 98.1°F | Resp 16 | Ht 70.5 in | Wt 210.0 lb

## 2013-06-21 DIAGNOSIS — N529 Male erectile dysfunction, unspecified: Secondary | ICD-10-CM

## 2013-06-21 DIAGNOSIS — Z23 Encounter for immunization: Secondary | ICD-10-CM

## 2013-06-21 DIAGNOSIS — R5381 Other malaise: Secondary | ICD-10-CM

## 2013-06-21 DIAGNOSIS — R5383 Other fatigue: Secondary | ICD-10-CM

## 2013-06-21 DIAGNOSIS — Z0000 Encounter for general adult medical examination without abnormal findings: Secondary | ICD-10-CM

## 2013-06-21 DIAGNOSIS — F17200 Nicotine dependence, unspecified, uncomplicated: Secondary | ICD-10-CM

## 2013-06-21 LAB — COMPREHENSIVE METABOLIC PANEL
ALT: 23 U/L (ref 0–53)
AST: 24 U/L (ref 0–37)
Albumin: 4.6 g/dL (ref 3.5–5.2)
Alkaline Phosphatase: 83 U/L (ref 39–117)
Calcium: 9.8 mg/dL (ref 8.4–10.5)
Chloride: 102 mEq/L (ref 96–112)
Potassium: 4.5 mEq/L (ref 3.5–5.3)
Sodium: 137 mEq/L (ref 135–145)
Total Protein: 7.9 g/dL (ref 6.0–8.3)

## 2013-06-21 LAB — CBC WITH DIFFERENTIAL/PLATELET
Basophils Absolute: 0 10*3/uL (ref 0.0–0.1)
Basophils Relative: 0 % (ref 0–1)
Eosinophils Absolute: 0.2 10*3/uL (ref 0.0–0.7)
Hemoglobin: 16.3 g/dL (ref 13.0–17.0)
MCH: 31.2 pg (ref 26.0–34.0)
MCHC: 34.5 g/dL (ref 30.0–36.0)
Monocytes Relative: 12 % (ref 3–12)
Neutrophils Relative %: 63 % (ref 43–77)
RDW: 14.1 % (ref 11.5–15.5)

## 2013-06-21 LAB — TSH: TSH: 1.539 u[IU]/mL (ref 0.350–4.500)

## 2013-06-21 LAB — LIPID PANEL
HDL: 52 mg/dL (ref >39)
LDL Cholesterol: 147 mg/dL — ABNORMAL HIGH (ref 0–99)
Total CHOL/HDL Ratio: 4.3 Ratio

## 2013-06-21 LAB — POCT URINALYSIS DIPSTICK
Bilirubin, UA: NEGATIVE
Glucose, UA: NEGATIVE
Leukocytes, UA: NEGATIVE
Nitrite, UA: NEGATIVE
Urobilinogen, UA: NEGATIVE

## 2013-06-21 MED ORDER — TADALAFIL 20 MG PO TABS: 20.0000 mg | ORAL_TABLET | Freq: Every day | ORAL | Status: DC | PRN

## 2013-06-21 MED ORDER — NICOTINE 21 MG/24HR TD PT24: 1.0000 | MEDICATED_PATCH | TRANSDERMAL | Status: DC

## 2013-06-21 MED ORDER — PANTOPRAZOLE SODIUM 40 MG PO TBEC: 40.0000 mg | DELAYED_RELEASE_TABLET | Freq: Every day | ORAL | Status: DC

## 2013-06-21 NOTE — Progress Notes (Signed)
Subjective:   HPI  Cody Tapia is a 47 y.o. male who presents for a complete physical.   Preventative care: Last ophthalmology visit: last eye within a year cause he is having trouble reading close up Last dental visit: every 3 months Last colonoscopy: 5 years ago Last prostate exam: last physical   Prior vaccinations: TD or Tdap: not that he is aware of never had one Influenza: never had a flu shot Pneumococcal: never Shingles/Zostavax never:  Advanced directive: Health care power of attorney: no Living will: regular will  Concerns: Can't seem to quit smoking . Didn't do well with chantix or Wellbutrin.  Wants advice.    Fatigue - job is stressful.  Can't seem to lose weight, has less energy and get up than he feels he should have at this age.  He does smoke .  Still has problems with erections.   cialis does help.  Wants TST checked  Reviewed their medical, surgical, family, social, medication, and allergy history and updated chart as appropriate.   Past Medical History  Diagnosis Date  . GERD (gastroesophageal reflux disease)   . Tobacco use   . ED (erectile dysfunction)   . Anxiety     prior therapy, in remission  . Wears glasses     Past Surgical History  Procedure Laterality Date  . Eye surgery    . Wisdom tooth extraction    . Colonoscopy  2009    [facility] for abdominal pain  . Esophagogastroduodenoscopy  2009    [facility], abdominal pain    Family History  Problem Relation Age of Onset  . Heart failure Father   . Heart disease Father 31    stent, CAD  . Sudden death Neg Hx   . Heart attack Neg Hx   . Hyperlipidemia Neg Hx   . Diabetes Neg Hx   . Hypertension Neg Hx   . Cancer Neg Hx   . Stroke Neg Hx     History   Social History  . Marital Status: Single    Spouse Name: N/A    Number of Children: N/A  . Years of Education: N/A   Occupational History  . Not on file.   Social History Main Topics  . Smoking status:  Current Every Day Smoker -- 1.00 packs/day for 20 years  . Smokeless tobacco: Not on file     Comment: Quit 1 month ago  . Alcohol Use: 1.2 oz/week    2 Cans of beer per week  . Drug Use: No  . Sexually Active: Not on file   Other Topics Concern  . Not on file   Social History Narrative   Girlfriend, 15yo daughter.   Exercise - cardio, elliptical, power walking, free weights 3 days per week.  Mortgage lending x 20 years.      Current Outpatient Prescriptions on File Prior to Visit  Medication Sig Dispense Refill  . CIALIS 20 MG tablet TAKE 1 TABLET BY MOUTH EVERY DAY AS NEEDED  6 tablet  1  . pantoprazole (PROTONIX) 40 MG tablet TAKE 1 TABLET BY MOUTH EVERY DAY  30 tablet  5  . albuterol (PROVENTIL HFA;VENTOLIN HFA) 108 (90 BASE) MCG/ACT inhaler Inhale 2 puffs into the lungs every 6 (six) hours as needed for wheezing.  1 Inhaler  0  . amoxicillin (AMOXIL) 875 MG tablet Take 1 tablet (875 mg total) by mouth 2 (two) times daily.  20 tablet  0   No current facility-administered medications  on file prior to visit.    Allergies  Allergen Reactions  . Wellbutrin (Bupropion)     Sleepwalking, intolerance     Review of Systems Constitutional: -fever, -chills, -sweats, -unexpected weight change, -decreased appetite, -fatigue Allergy: -sneezing, -itching, -congestion Dermatology: -changing moles, --rash, -lumps ENT: -runny nose, -ear pain, -sore throat, -hoarseness, -sinus pain, -teeth pain, - ringing in ears, -hearing loss, -nosebleeds Cardiology: -chest pain, -palpitations, -swelling, -difficulty breathing when lying flat, -waking up short of breath Respiratory: -cough, -shortness of breath, -difficulty breathing with exercise or exertion, -wheezing, -coughing up blood Gastroenterology: -abdominal pain, -nausea, -vomiting, -diarrhea, -constipation, -blood in stool, -changes in bowel movement, -difficulty swallowing or eating Hematology: -bleeding, -bruising  Musculoskeletal: -joint  aches, -muscle aches, -joint swelling, -back pain, -neck pain, -cramping, -changes in gait Ophthalmology: + vision changes, eye redness, itching, discharge Urology: -burning with urination, -difficulty urinating, -blood in urine, -urinary frequency, -urgency, -incontinence Neurology: -headache, -weakness, -tingling, -numbness, -memory loss, -falls, -dizziness Psychology: -depressed mood, -agitation, -sleep problems     Objective:   Physical Exam  Vitals and nurse notes reviewed  General appearance: alert, no distress, WD/WN, white male Skin: scattered benign appearing macules, freckles throughout, no worrisome lesions HEENT: normocephalic, conjunctiva/corneas normal, sclerae anicteric, PERRLA, EOMi, nares patent, no discharge or erythema, pharynx normal Oral cavity: MMM, tongue normal, teeth in good repair Neck: supple, no lymphadenopathy, no thyromegaly, no masses, normal ROM, no bruits  Chest: non tender, normal shape and expansion Heart: RRR, normal S1, S2, no murmurs Lungs: CTA bilaterally, no wheezes, rhonchi, or rales Abdomen: +bs, soft, non tender, non distended, no masses, no hepatomegaly, no splenomegaly, no bruits Back: non tender, normal ROM, no scoliosis Musculoskeletal: upper extremities non tender, no obvious deformity, normal ROM throughout, lower extremities non tender, no obvious deformity, normal ROM throughout Extremities: no edema, no cyanosis, no clubbing Pulses: 2+ symmetric, upper and lower extremities, normal cap refill Neurological: alert, oriented x 3, CN2-12 intact, strength normal upper extremities and lower extremities, sensation normal throughout, DTRs 2+ throughout, no cerebellar signs, gait normal Psychiatric: normal affect, behavior normal, pleasant  GU: normal male external genitalia, circumcised, nontender, no masses, no hernia, no lymphadenopathy Rectal: deferred   Adult ECG Report  Indication: ED, fatigue  Rate: 58bpm  Rhythm: sinus bradycardia   QRS Axis: 54 degrees  PR Interval:  QRS Duration: 94ms  QTc:  Conduction Disturbances: none  Other Abnormalities: none  Patient's cardiac risk factors are: male gender and smoking/ tobacco exposure.  EKG comparison: none to compare  Narrative Interpretation: sinus bradycardia, otherwise normal EKG    Assessment and Plan :      Encounter Diagnoses  Name Primary?  . Routine general medical examination at a health care facility Yes  . Tobacco use disorder   . Other malaise and fatigue   . Erectile dysfunction   . Need for Tdap vaccination     Physical exam - discussed healthy lifestyle, diet, exercise, preventative care, vaccinations, and addressed their concerns.   Tobacco use - advised hotline for counseling, begin Nicoderm patches, begin weaning down cigarettes per day, counseled, gave handout. fatigue - labs today ED - does well on Cialis, c/t same medication tdap vaccine, VIS and counseling given.  Declines pneumococcal vaccine today. Follow-up pending labs

## 2013-08-10 ENCOUNTER — Ambulatory Visit (INDEPENDENT_AMBULATORY_CARE_PROVIDER_SITE_OTHER): Payer: BC Managed Care – PPO | Admitting: Family Medicine

## 2013-08-10 ENCOUNTER — Encounter: Payer: Self-pay | Admitting: Family Medicine

## 2013-08-10 VITALS — BP 118/80 | Wt 202.0 lb

## 2013-08-10 DIAGNOSIS — B029 Zoster without complications: Secondary | ICD-10-CM

## 2013-08-10 NOTE — Progress Notes (Signed)
  Subjective:    Patient ID: Cody Tapia, male    DOB: 03-Sep-1966, 47 y.o.   MRN: [id number]  HPI Approximately 2 weeks ago he woke up with what he describes as a stiffness to the right side of his neck. After that he did no some swelling and redness in the right posterior neck and submandibular area. These lesions have slowly gone away but he is still having occasional paroxysms of pain in that area.   Review of Systems     Objective:   Physical Exam Alert and in no distress. Several erythematous healing lesions are noted in the right posterior neck area. Exam of the scalp shows no lesions.       Assessment & Plan:  Shingles rash the signs and symptoms are most consistent with shingles. I explained this to him and explained that at this time there is no intervention that is necessary.

## 2013-09-04 ENCOUNTER — Other Ambulatory Visit: Payer: Self-pay | Admitting: Medical

## 2013-09-12 ENCOUNTER — Ambulatory Visit (INDEPENDENT_AMBULATORY_CARE_PROVIDER_SITE_OTHER): Payer: BC Managed Care – PPO | Admitting: Medical

## 2013-09-12 ENCOUNTER — Encounter: Payer: Self-pay | Admitting: Medical

## 2013-09-12 VITALS — BP 130/88 | HR 76 | Temp 98.1°F | Resp 16 | Wt 207.0 lb

## 2013-09-12 DIAGNOSIS — F17200 Nicotine dependence, unspecified, uncomplicated: Secondary | ICD-10-CM

## 2013-09-12 DIAGNOSIS — M542 Cervicalgia: Secondary | ICD-10-CM

## 2013-09-12 DIAGNOSIS — M79602 Pain in left arm: Secondary | ICD-10-CM

## 2013-09-12 DIAGNOSIS — M79609 Pain in unspecified limb: Secondary | ICD-10-CM

## 2013-09-12 MED ORDER — DICLOFENAC SODIUM 75 MG PO TBEC: 75.0000 mg | DELAYED_RELEASE_TABLET | Freq: Two times a day (BID) | ORAL | Status: DC

## 2013-09-12 NOTE — Progress Notes (Signed)
Subjective: Here for complaint of one week of left upper arm aching pain x1 week. He prefaces this by saying all of the 2014 he has had trouble with his left arm.  He is frustrated.  He saw 3 different orthopedist, had 3 different steroid injections into his tendon for tendinitis/tennis elbow.  Despite steroid shots, physical therapy, rest, ice, things really never got better.  Eventually however, his elbow area did improve.  That's why he decided to come in after just one week of left arm pain.  He denies injury, trauma, fall, no prior injury. Currently has a constant ache in his left upper arm, briefly this morning had generalized numbness and tingling sensation in his left hand but this resolved. He denies fever, weakness, bruising, redness, shortness of breath, chest pain, swelling. He does get occasional mild neck pain that at times may radiate to the left arm. No other aggravating or relieving factors. No other complaint   Objective: Filed Vitals:   09/12/13 1137  BP: 130/88  Pulse: 76  Temp: 98.1 F (36.7 C)  Resp: 16    General appearance: alert, no distress, WD/WN Skin: no ecchymosis, erythema, warmth or swelling Neck: supple, no lymphadenopathy, no thyromegaly, no masses, nontender, slightly decreased range of motion in general Msk: Left shoulder and arm nontender to palpation, normal range of motion, negative special tests, no deformity.  Right UE unremarkable Back: Upper back nontender Heart: RRR, normal S1, S2, no murmurs Lungs: CTA bilaterally, no wheezes, rhonchi, or rales Pulses: 2+ symmetric, upper and lower extremities, normal cap refill Ext: No swelling, no deformity Neuro: Normal upper extremity strength, sensation and DTRs  Assessment: Encounter Diagnoses  Name Primary?  . Arm pain, diffuse, left Yes  . Neck pain   . Tobacco use disorder     Plan: Discuss case with Dr. Susann Givens supervising physician.  We discussed his symptoms, concerns, exam findings.  Consider  differential, but etiology likely inflammation vs arthritis.  We will send him for neck and shoulder x-Brogen. For now begin diclofenac anti-inflammatory, relative rest, no heavy lifting particularly overhead. Can use ice to the shoulder.  Follow up pending x-rays, 2 week recheck, sooner prn.

## 2013-09-25 ENCOUNTER — Ambulatory Visit
Admission: RE | Admit: 2013-09-25 | Discharge: 2013-09-25 | Disposition: A | Discharge status: Home / Self Care | Payer: BC Managed Care – PPO | Source: Ambulatory Visit | Attending: Medical | Admitting: Medical

## 2013-09-25 DIAGNOSIS — M542 Cervicalgia: Secondary | ICD-10-CM

## 2013-09-25 DIAGNOSIS — M79602 Pain in left arm: Secondary | ICD-10-CM

## 2013-09-25 IMAGING — CR DG SHOULDER 2+V*L*
3 series · 3 of 3 positions shown · non-contrast
Comparison: None.

CLINICAL DATA: Left arm pain.

EXAM:
LEFT SHOULDER - 2+ VIEW

[view not recorded (1 of 3)]
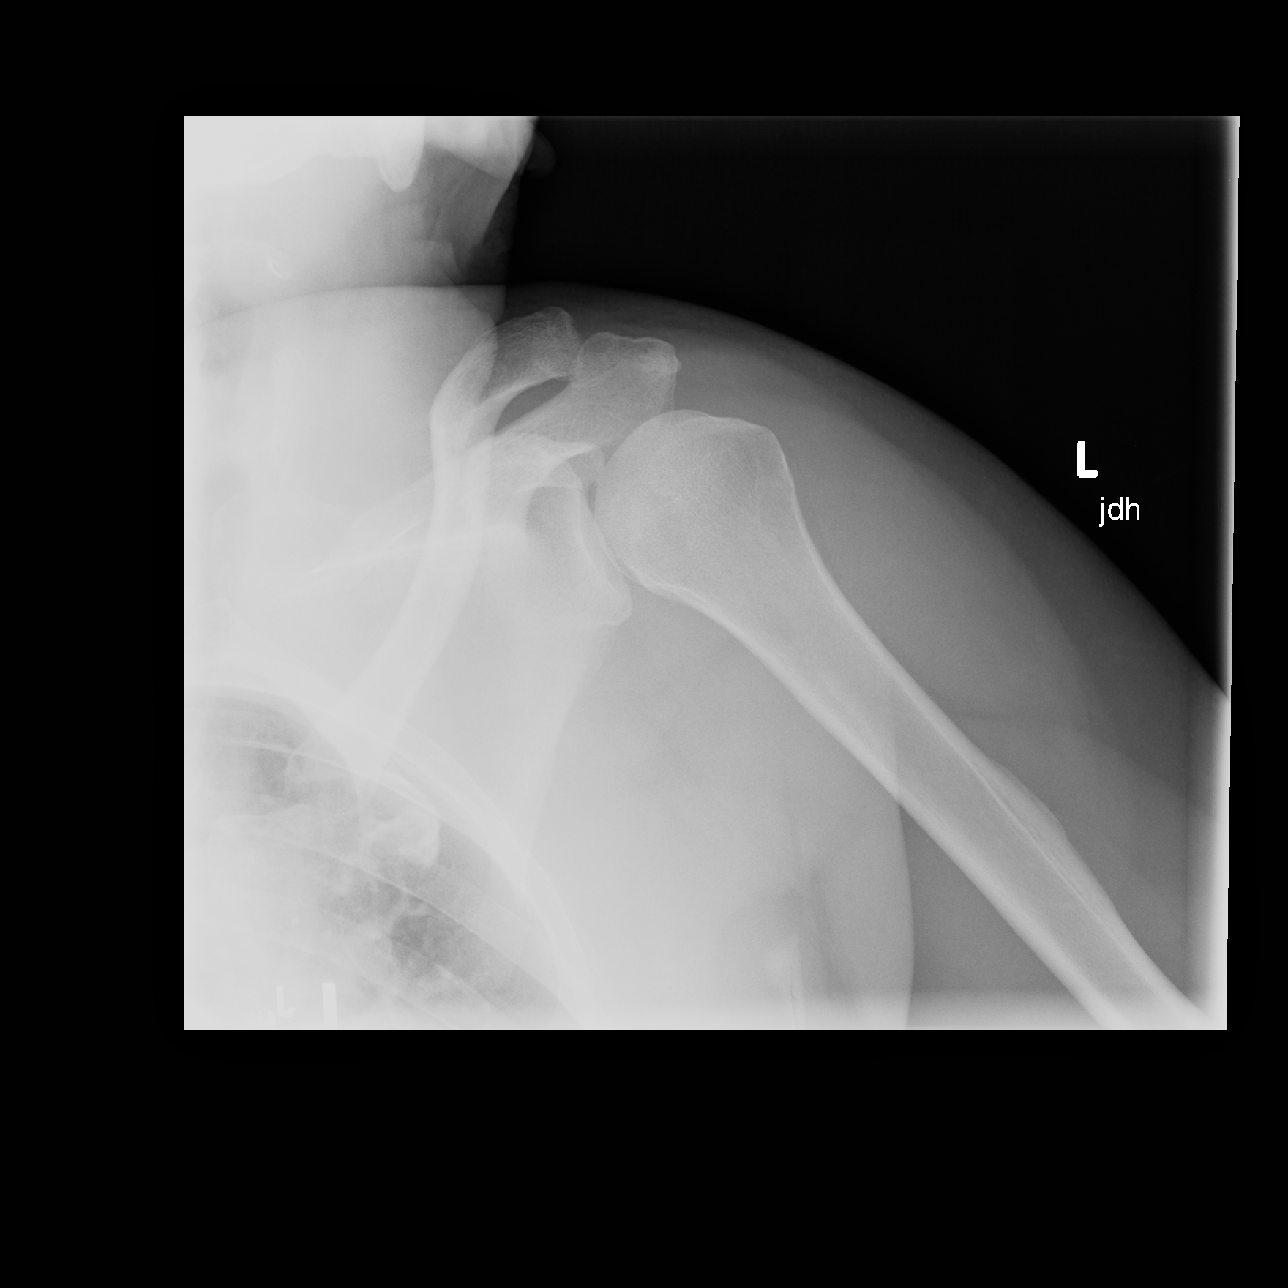

[view not recorded (2 of 3)]
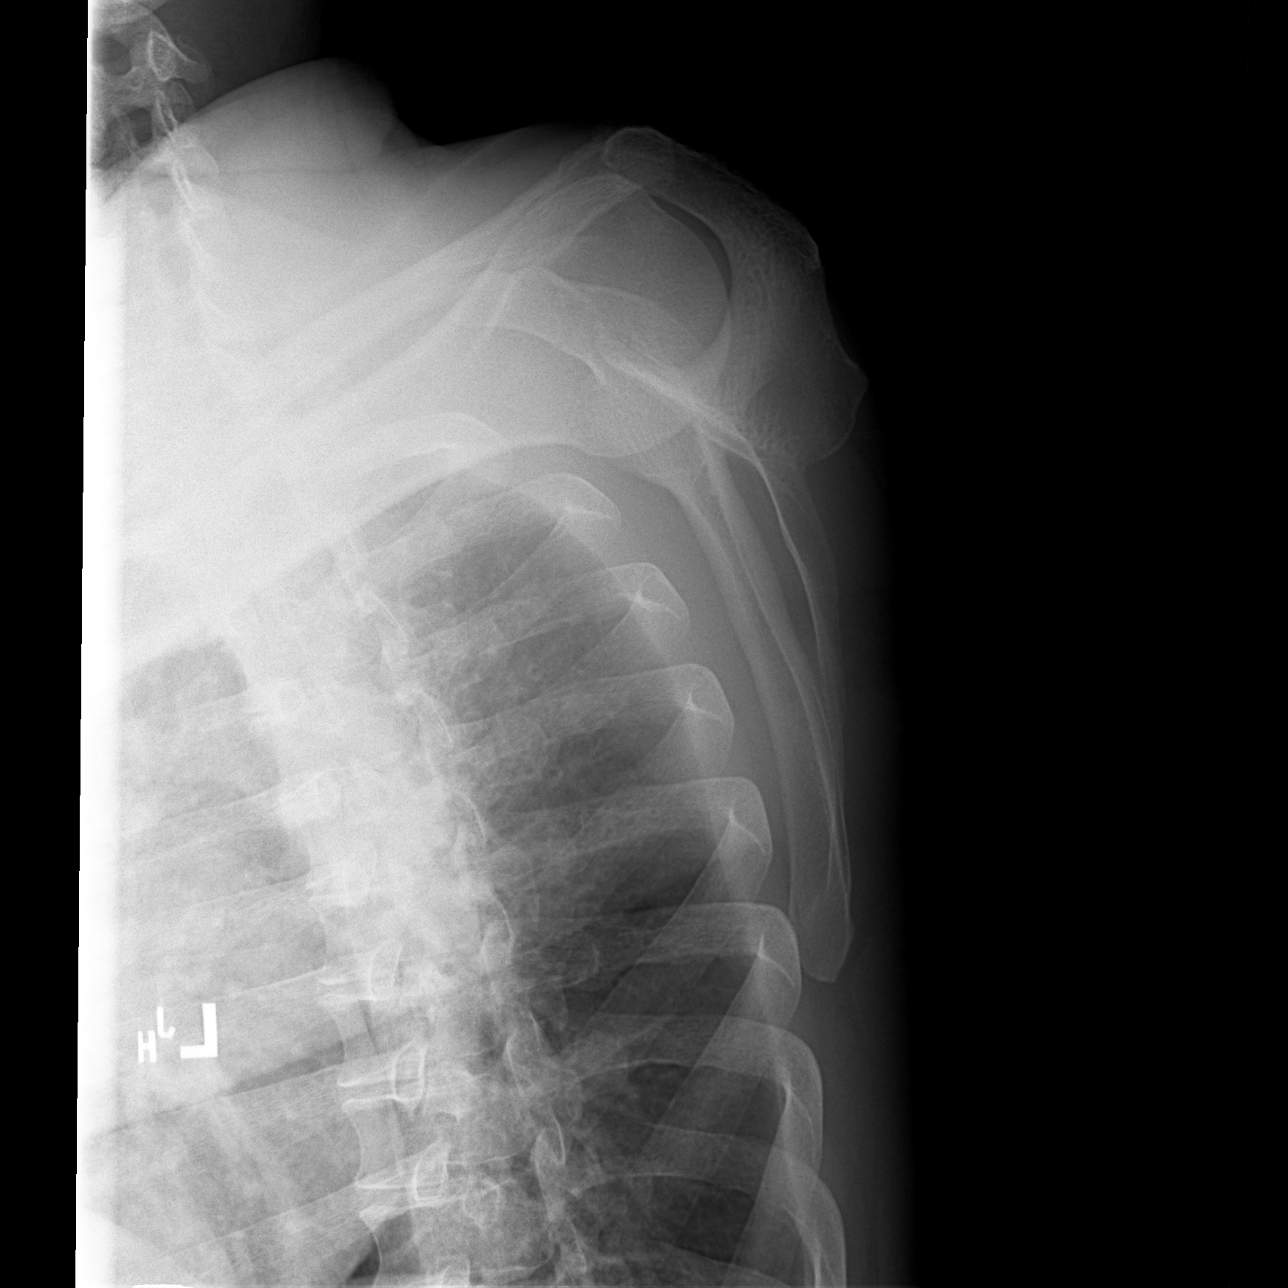

[view not recorded (3 of 3)]
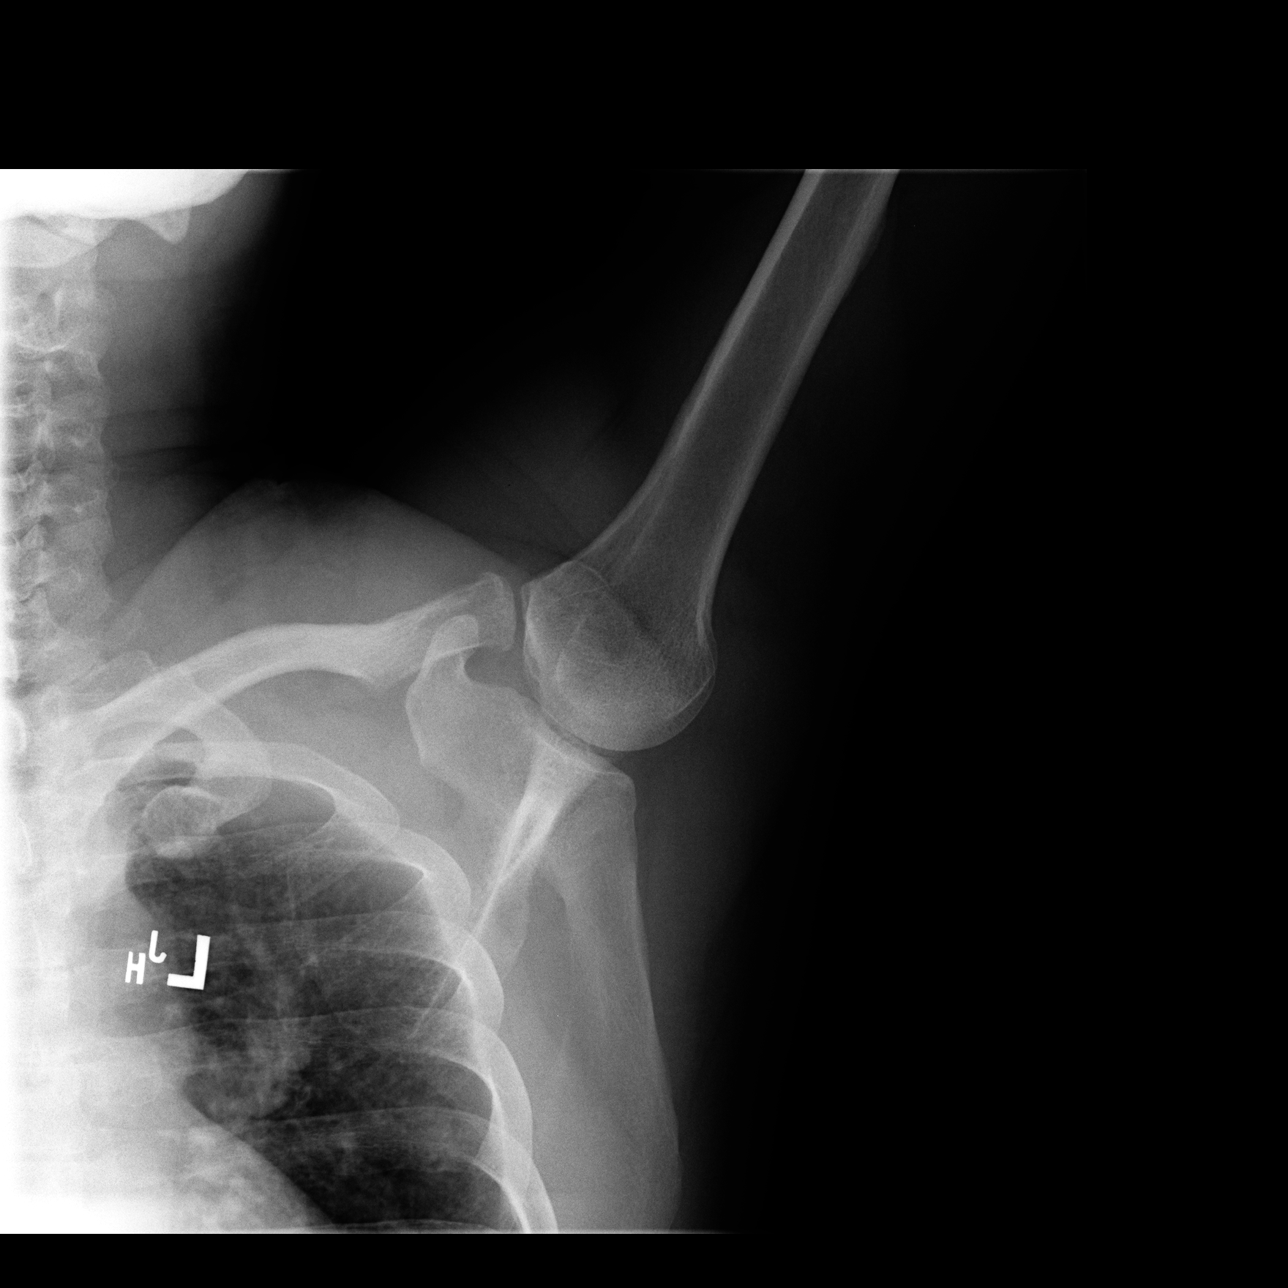

[3 of 3 positions shown; findings below may reference images not displayed]

FINDINGS: There is no evidence of fracture or dislocation. There is no
evidence of arthropathy or other focal bone abnormality. Soft
tissues are unremarkable.
IMPRESSION: Normal exam.

## 2013-09-25 IMAGING — CR DG CERVICAL SPINE COMPLETE 4+V
6 series · 6 of 6 positions shown · non-contrast
Comparison: None.

CLINICAL DATA: Neck/left arm pain

EXAM:
CERVICAL SPINE  4+ VIEWS

[view not recorded (1 of 6)]
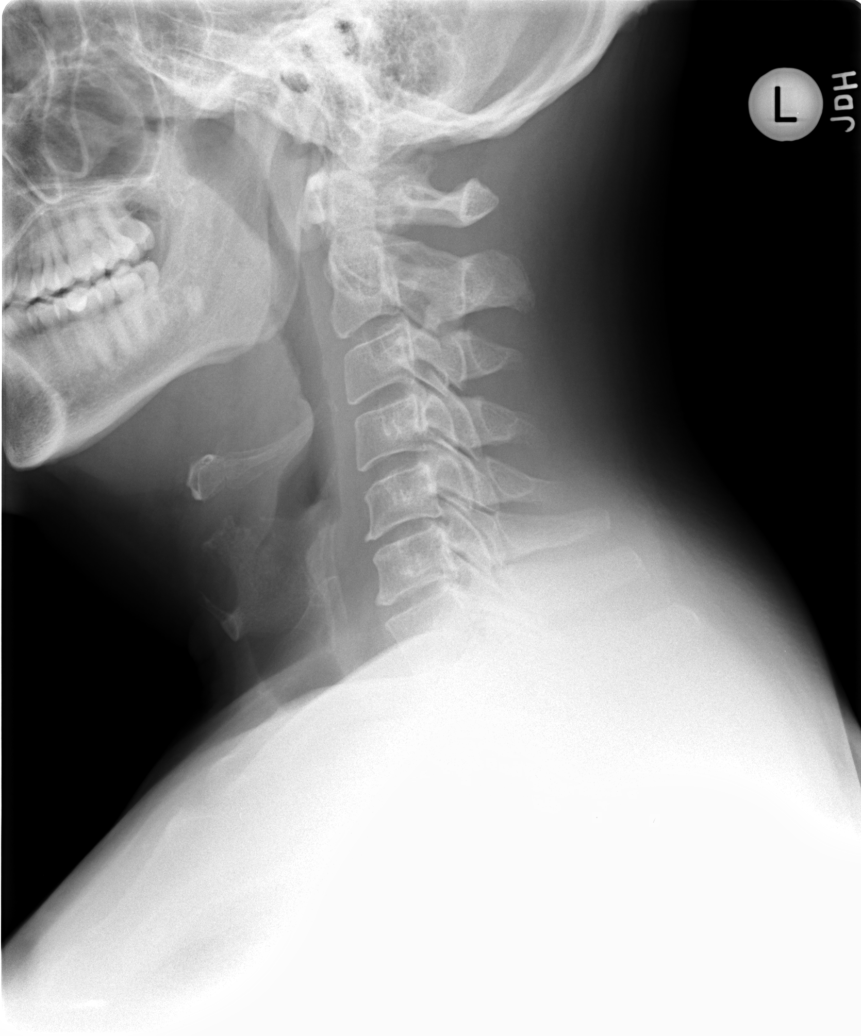

[view not recorded (2 of 6)]
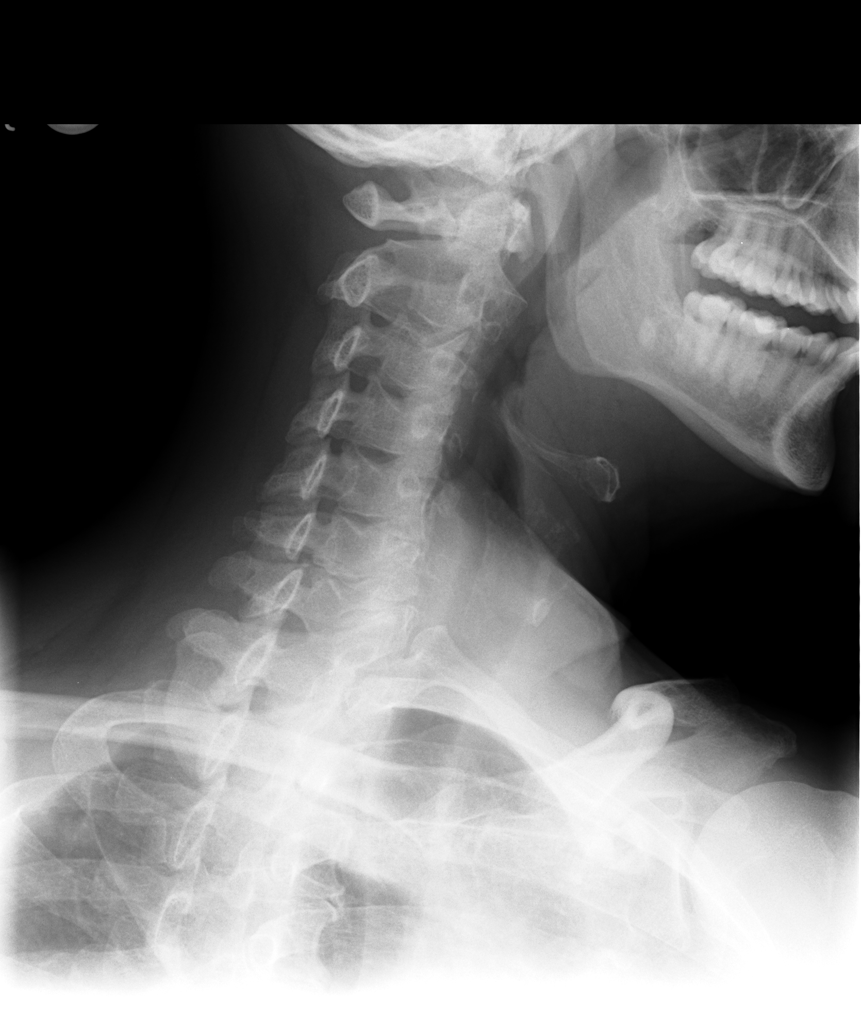

[view not recorded (3 of 6)]
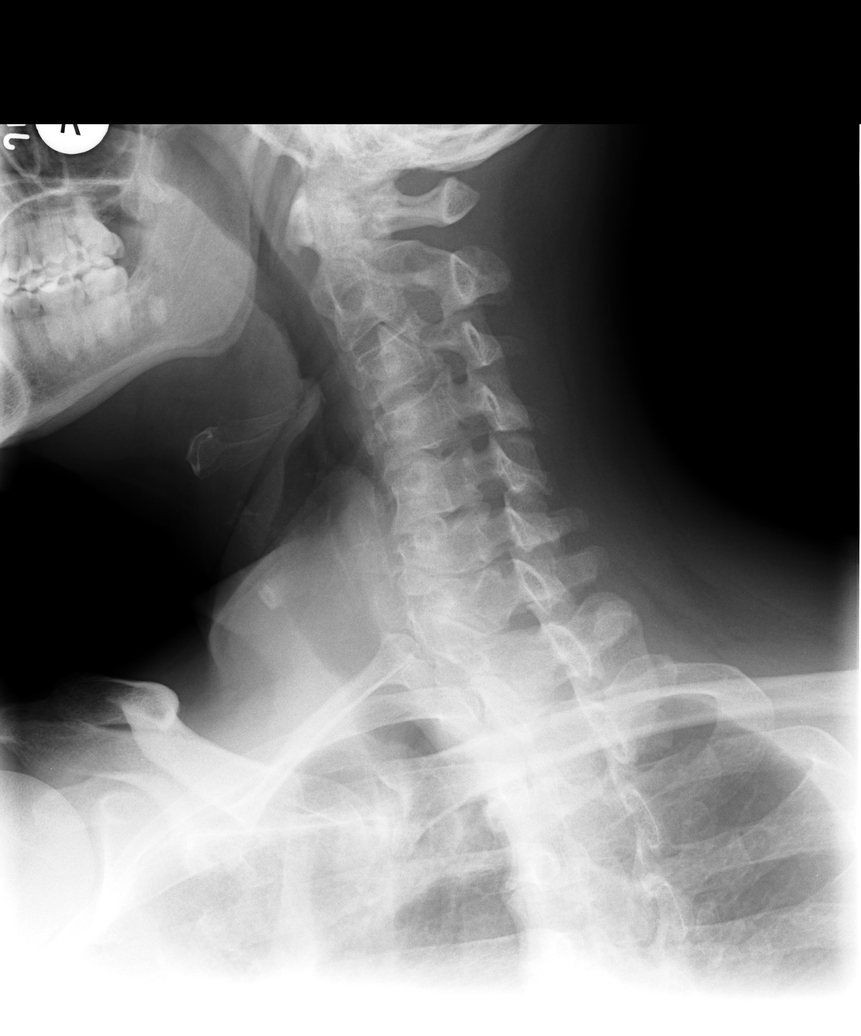

[view not recorded (4 of 6)]
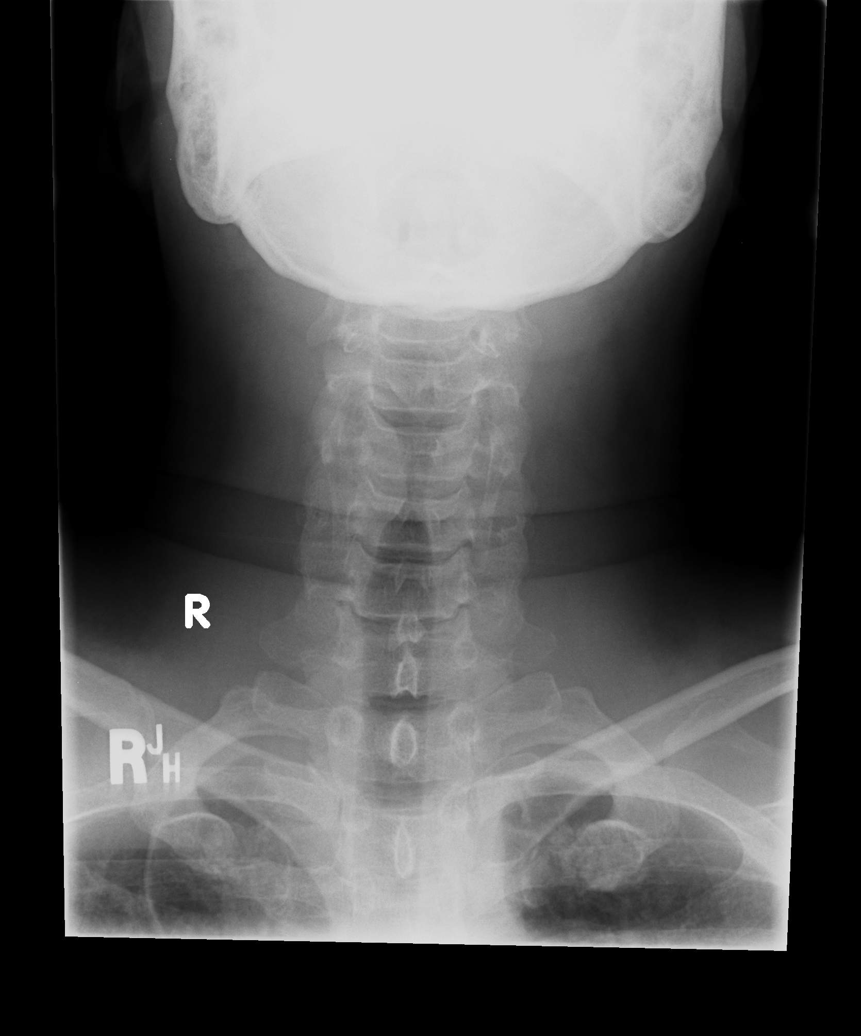

[view not recorded (5 of 6)]
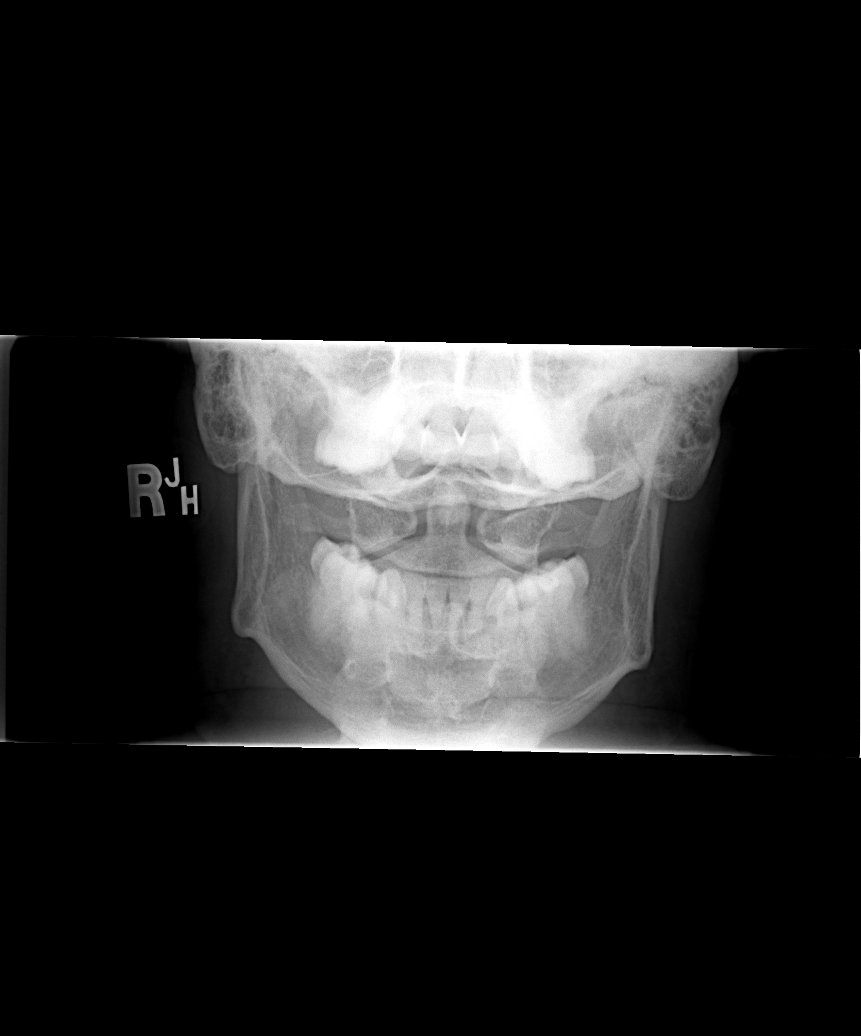

[view not recorded (6 of 6)]
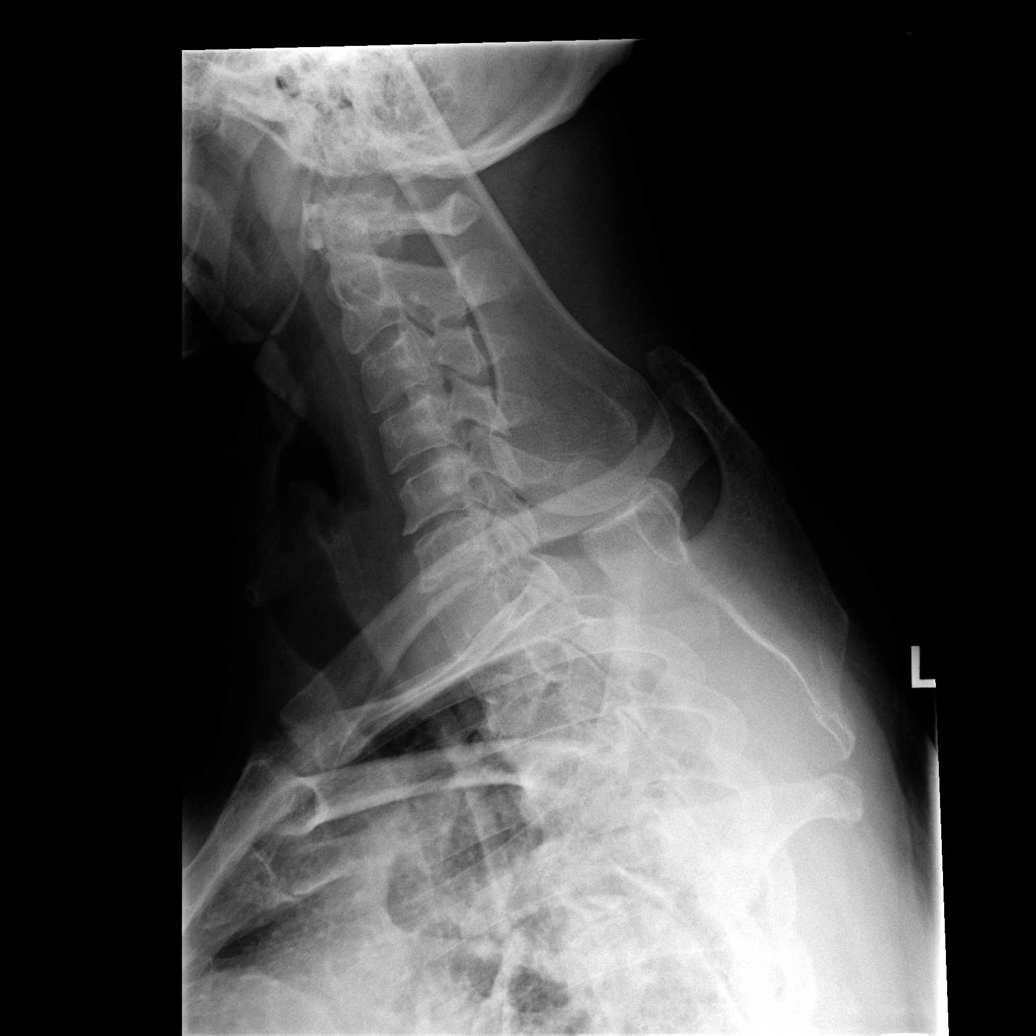

[6 of 6 positions shown; findings below may reference images not displayed]

FINDINGS: Cervical spine is visualized to the bottom of C7 on the lateral
view.

Straightening of the cervical spine.

No evidence of fracture or dislocation. Vertebral body heights are
maintained. Dens appears intact. Lateral masses of C1 are symmetric.

Mild degenerative changes at C5-6 and C6-7.

Mild narrowing of the right C3-4, C4-5, and C6-7 neural foramen.
Mild narrowing of the left C3-4 neural foramen.

Visualized lung apices are clear.
IMPRESSION: No fracture or dislocation is seen.

Mild multilevel degenerative changes, as described above.

## 2013-09-27 ENCOUNTER — Other Ambulatory Visit: Payer: Self-pay | Admitting: Medical

## 2013-09-27 MED ORDER — CYCLOBENZAPRINE HCL 10 MG PO TABS: ORAL_TABLET | ORAL | Status: DC

## 2013-09-27 MED ORDER — TRAMADOL HCL 50 MG PO TABS: 50.0000 mg | ORAL_TABLET | Freq: Four times a day (QID) | ORAL | Status: DC | PRN

## 2013-09-27 MED ORDER — METHYLPREDNISOLONE (PAK) 4 MG PO TABS: ORAL_TABLET | ORAL | Status: DC

## 2013-09-27 NOTE — Progress Notes (Signed)
I called the Tramadol to the pharmacy. CLS

## 2013-12-22 ENCOUNTER — Other Ambulatory Visit: Payer: Self-pay | Admitting: Medical

## 2013-12-22 ENCOUNTER — Telehealth: Payer: Self-pay | Admitting: Medical

## 2013-12-22 NOTE — Telephone Encounter (Signed)
Is this okay to refill? 

## 2013-12-22 NOTE — Telephone Encounter (Signed)
done

## 2013-12-22 NOTE — Telephone Encounter (Signed)
Pt needs a refill on cialis sent to [facility].

## 2014-02-27 ENCOUNTER — Ambulatory Visit (INDEPENDENT_AMBULATORY_CARE_PROVIDER_SITE_OTHER): Payer: BC Managed Care – PPO | Admitting: Medical

## 2014-02-27 ENCOUNTER — Telehealth: Payer: Self-pay | Admitting: Internal Medicine

## 2014-02-27 ENCOUNTER — Encounter: Payer: Self-pay | Admitting: Medical

## 2014-02-27 VITALS — BP 118/80 | HR 86 | Temp 98.1°F | Resp 14 | Wt 201.0 lb

## 2014-02-27 DIAGNOSIS — N529 Male erectile dysfunction, unspecified: Secondary | ICD-10-CM

## 2014-02-27 DIAGNOSIS — F17200 Nicotine dependence, unspecified, uncomplicated: Secondary | ICD-10-CM

## 2014-02-27 DIAGNOSIS — E663 Overweight: Secondary | ICD-10-CM

## 2014-02-27 DIAGNOSIS — K219 Gastro-esophageal reflux disease without esophagitis: Secondary | ICD-10-CM

## 2014-02-27 MED ORDER — PHENTERMINE HCL 37.5 MG PO TABS: 37.5000 mg | ORAL_TABLET | Freq: Every day | ORAL | Status: DC

## 2014-02-27 MED ORDER — PANTOPRAZOLE SODIUM 40 MG PO TBEC: 40.0000 mg | DELAYED_RELEASE_TABLET | Freq: Every day | ORAL | Status: DC

## 2014-02-27 MED ORDER — TADALAFIL 20 MG PO TABS: 20.0000 mg | ORAL_TABLET | Freq: Every day | ORAL | Status: DC | PRN

## 2014-02-27 NOTE — Patient Instructions (Addendum)
  Thank you for giving me the opportunity to serve you today.    Your diagnosis today includes: Encounter Diagnoses  Name Primary?  . GERD (gastroesophageal reflux disease) Yes  . Erectile dysfunction   . Tobacco use disorder   . Overweight (BMI 25.0-29.9)      Specific recommendations today include:  Continue current medications  Limit acidic and spicy food, big portions  Continue routine exercise and healthy diet for weight loss efforts  Begin trial of phentermine once daily in the morning as an appetite suppressant and for weight loss  We will request your prior EGD and colonoscopy to review  Continue efforts at smoking cessation including 1 800 quit now hotline  Return pending records review, 2 mo on Phentermine.

## 2014-02-27 NOTE — Telephone Encounter (Signed)
Faxed medical record release to [facility] @ [phone]

## 2014-02-27 NOTE — Progress Notes (Signed)
   Subjective:   Cody Tapia is a 48 y.o. male presenting on 02/27/2014 with [place]  Exercising regularly.   Still smoking though.  Hasn't done well with other efforts at tobacco cessation.  Has lost some weight with improvement on exercise and diet, 10lb loss since last visit.  Eating healthy.   Can't seem to lose additional weight.   Considering working with a Clinical research associate.  Has never used weight loss medication.  GERD- takes protonix every day, and if he doesn't take this, has horrible heartburn and reflux.   Last EGD 2009, doesn't recall abnormality.   No f/u planned.  This was done due to LUQ pain which resolved.   ED - does well on Cialis.   No hx/o BPH.   Had major headache with Viagra.  No other trials of other ED medications.    No new problems.   No other complaint.  Review of Systems ROS as in subjective      Objective:     Filed Vitals:   02/27/14 0856  BP: 118/80  Pulse: 86  Temp: 98.1 F (36.7 C)  Resp: 14    General appearance: alert, no distress, WD/WN Oral cavity: MMM, no lesions Neck: supple, no lymphadenopathy, no thyromegaly, no masses Heart: RRR, normal S1, S2, no murmurs Lungs: CTA bilaterally, no wheezes, rhonchi, or rales Abdomen: +bs, soft, non tender, non distended, no masses, no hepatomegaly, no splenomegaly Pulses: 2+ symmetric, upper and lower extremities, normal cap refill      Assessment: Encounter Diagnoses  Name Primary?  . GERD (gastroesophageal reflux disease) Yes  . Erectile dysfunction   . Tobacco use disorder   . Overweight (BMI 25.0-29.9)      Plan: Discussed his medications, discussed efforts at weight loss.  Advised he consider working with a trainer, beginning Phentermine to help.  Discussed risks/benefits of phentermine.  recheck 1-2 mo on this.  Consider repeat EGD, testing for H Pylori going forward.  We will request and review prior endocsocpy reports.   Specific recommendations today include:  Continue  current medications  Limit acidic and spicy food, big portions  Continue routine exercise and healthy diet for weight loss efforts  Begin trial of phentermine once daily in the morning as an appetite suppressant and for weight loss  We will request your prior EGD and colonoscopy to review  Continue efforts at smoking cessation including 1 800 quit now hotline  RJ was seen today for medication check.  Diagnoses and associated orders for this visit:  GERD (gastroesophageal reflux disease)  Erectile dysfunction  Tobacco use disorder  Overweight (BMI 25.0-29.9)  Other Orders - phentermine (ADIPEX-P) 37.5 MG tablet; Take 1 tablet (37.5 mg total) by mouth daily before breakfast. - tadalafil (CIALIS) 20 MG tablet; Take 1 tablet (20 mg total) by mouth daily as needed for erectile dysfunction. - pantoprazole (PROTONIX) 40 MG tablet; Take 1 tablet (40 mg total) by mouth daily.    Return pending records review, 2 mo on Phentermine.

## 2014-03-01 ENCOUNTER — Telehealth: Payer: Self-pay | Admitting: Internal Medicine

## 2014-03-01 NOTE — Telephone Encounter (Signed)
Medical records from [facility] recieved

## 2014-03-05 ENCOUNTER — Encounter: Payer: Self-pay | Admitting: Internal Medicine

## 2014-03-08 ENCOUNTER — Encounter: Payer: Self-pay | Admitting: Medical

## 2014-04-30 ENCOUNTER — Ambulatory Visit: Payer: BC Managed Care – PPO | Admitting: Medical

## 2014-05-14 ENCOUNTER — Ambulatory Visit: Payer: BC Managed Care – PPO | Admitting: Medical

## 2014-09-21 ENCOUNTER — Other Ambulatory Visit: Payer: Self-pay | Admitting: Medical

## 2014-10-22 ENCOUNTER — Encounter: Payer: Self-pay | Admitting: Medical

## 2014-10-22 ENCOUNTER — Ambulatory Visit (INDEPENDENT_AMBULATORY_CARE_PROVIDER_SITE_OTHER): Payer: BC Managed Care – PPO | Admitting: Medical

## 2014-10-22 ENCOUNTER — Ambulatory Visit
Admission: RE | Admit: 2014-10-22 | Discharge: 2014-10-22 | Disposition: A | Discharge status: Home / Self Care | Payer: BC Managed Care – PPO | Source: Ambulatory Visit | Attending: Medical | Admitting: Medical

## 2014-10-22 VITALS — BP 132/80 | HR 80 | Temp 97.9°F | Resp 16 | Wt 221.0 lb

## 2014-10-22 DIAGNOSIS — Z720 Tobacco use: Secondary | ICD-10-CM

## 2014-10-22 DIAGNOSIS — R0602 Shortness of breath: Secondary | ICD-10-CM

## 2014-10-22 DIAGNOSIS — N50819 Testicular pain, unspecified: Secondary | ICD-10-CM

## 2014-10-22 DIAGNOSIS — E669 Obesity, unspecified: Secondary | ICD-10-CM | POA: Insufficient documentation

## 2014-10-22 DIAGNOSIS — N509 Disorder of male genital organs, unspecified: Secondary | ICD-10-CM

## 2014-10-22 DIAGNOSIS — R5381 Other malaise: Secondary | ICD-10-CM

## 2014-10-22 DIAGNOSIS — N5319 Other ejaculatory dysfunction: Secondary | ICD-10-CM

## 2014-10-22 DIAGNOSIS — F430 Acute stress reaction: Secondary | ICD-10-CM

## 2014-10-22 DIAGNOSIS — R5383 Other fatigue: Secondary | ICD-10-CM

## 2014-10-22 DIAGNOSIS — F17210 Nicotine dependence, cigarettes, uncomplicated: Secondary | ICD-10-CM | POA: Insufficient documentation

## 2014-10-22 DIAGNOSIS — N508 Other specified disorders of male genital organs: Secondary | ICD-10-CM

## 2014-10-22 LAB — POCT URINALYSIS DIPSTICK
BILIRUBIN UA: NEGATIVE
Blood, UA: NEGATIVE
GLUCOSE UA: NEGATIVE
KETONES UA: NEGATIVE
LEUKOCYTES UA: NEGATIVE
NITRITE UA: NEGATIVE
PH UA: 6.5
Protein, UA: NEGATIVE
Spec Grav, UA: 1.015
Urobilinogen, UA: NEGATIVE

## 2014-10-22 IMAGING — CR DG CHEST 2V
2 series · 2 of 2 positions shown · non-contrast
Comparison: None.

CLINICAL DATA: Shortness of breath.

EXAM:
CHEST  2 VIEW

[view not recorded (1 of 2)]
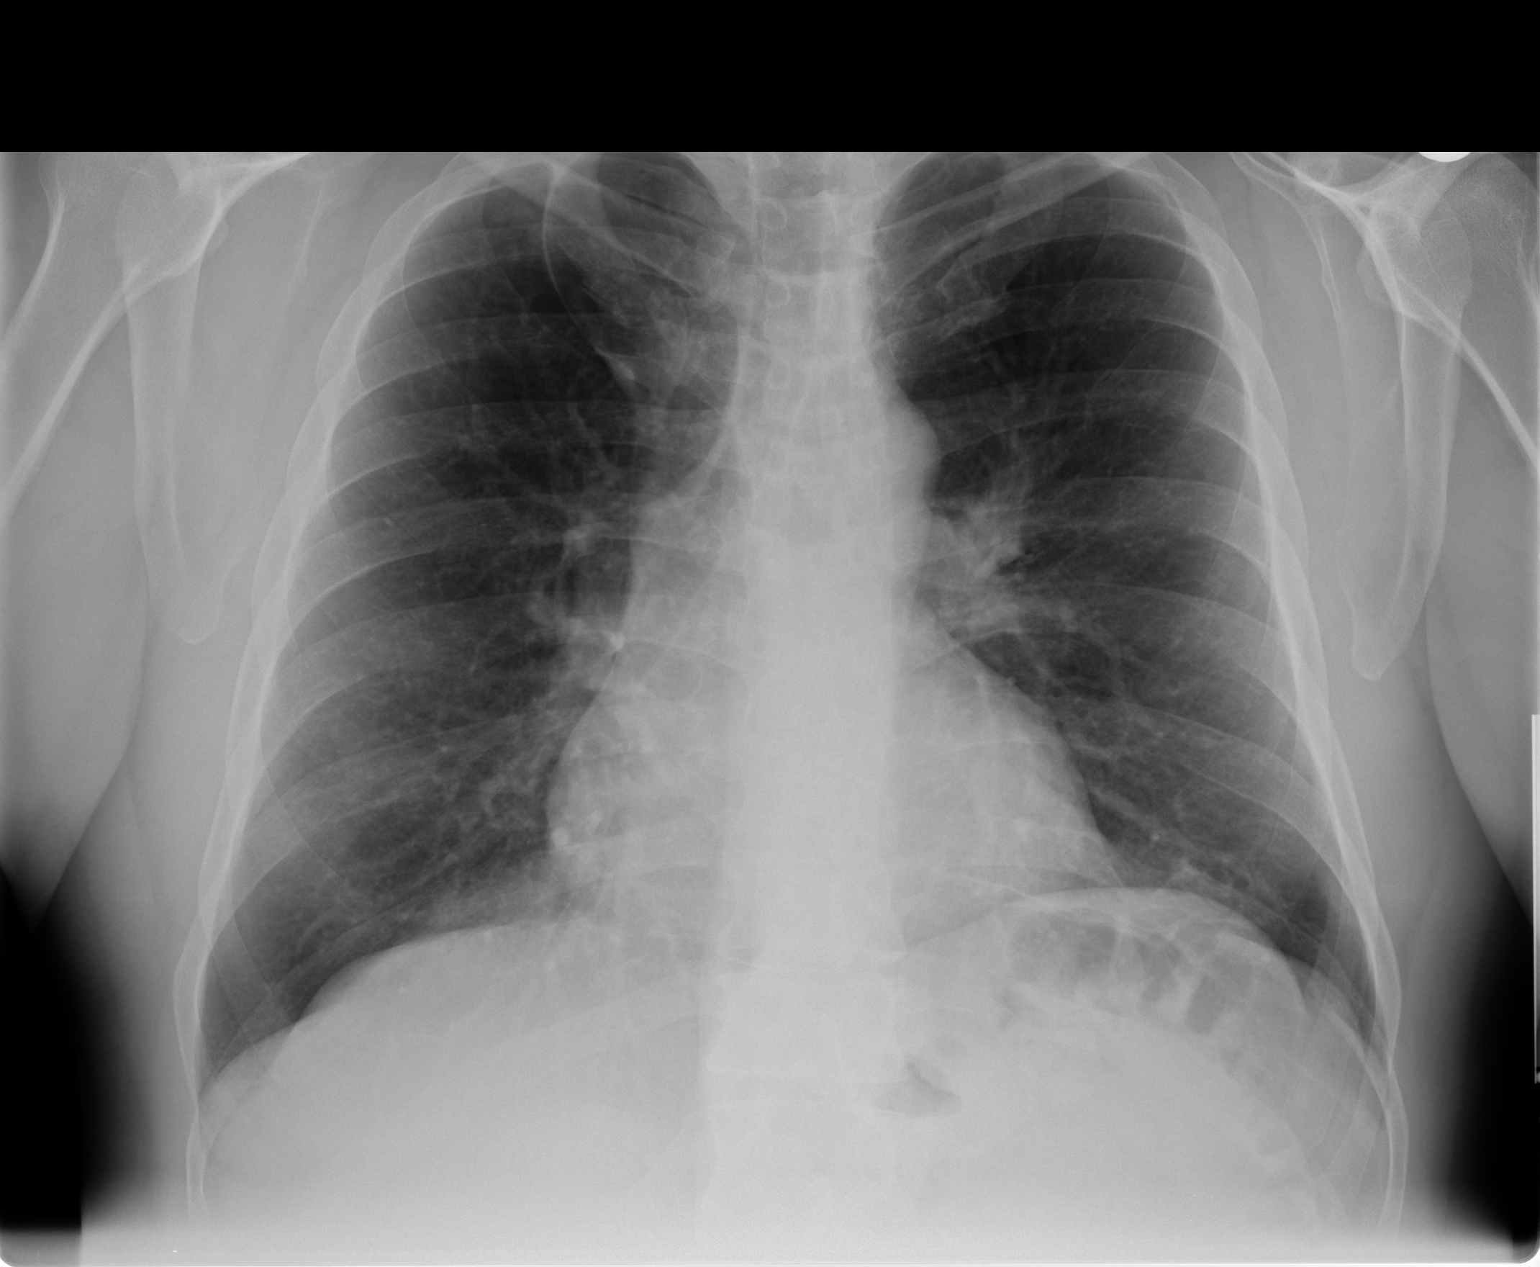

[view not recorded (2 of 2)]
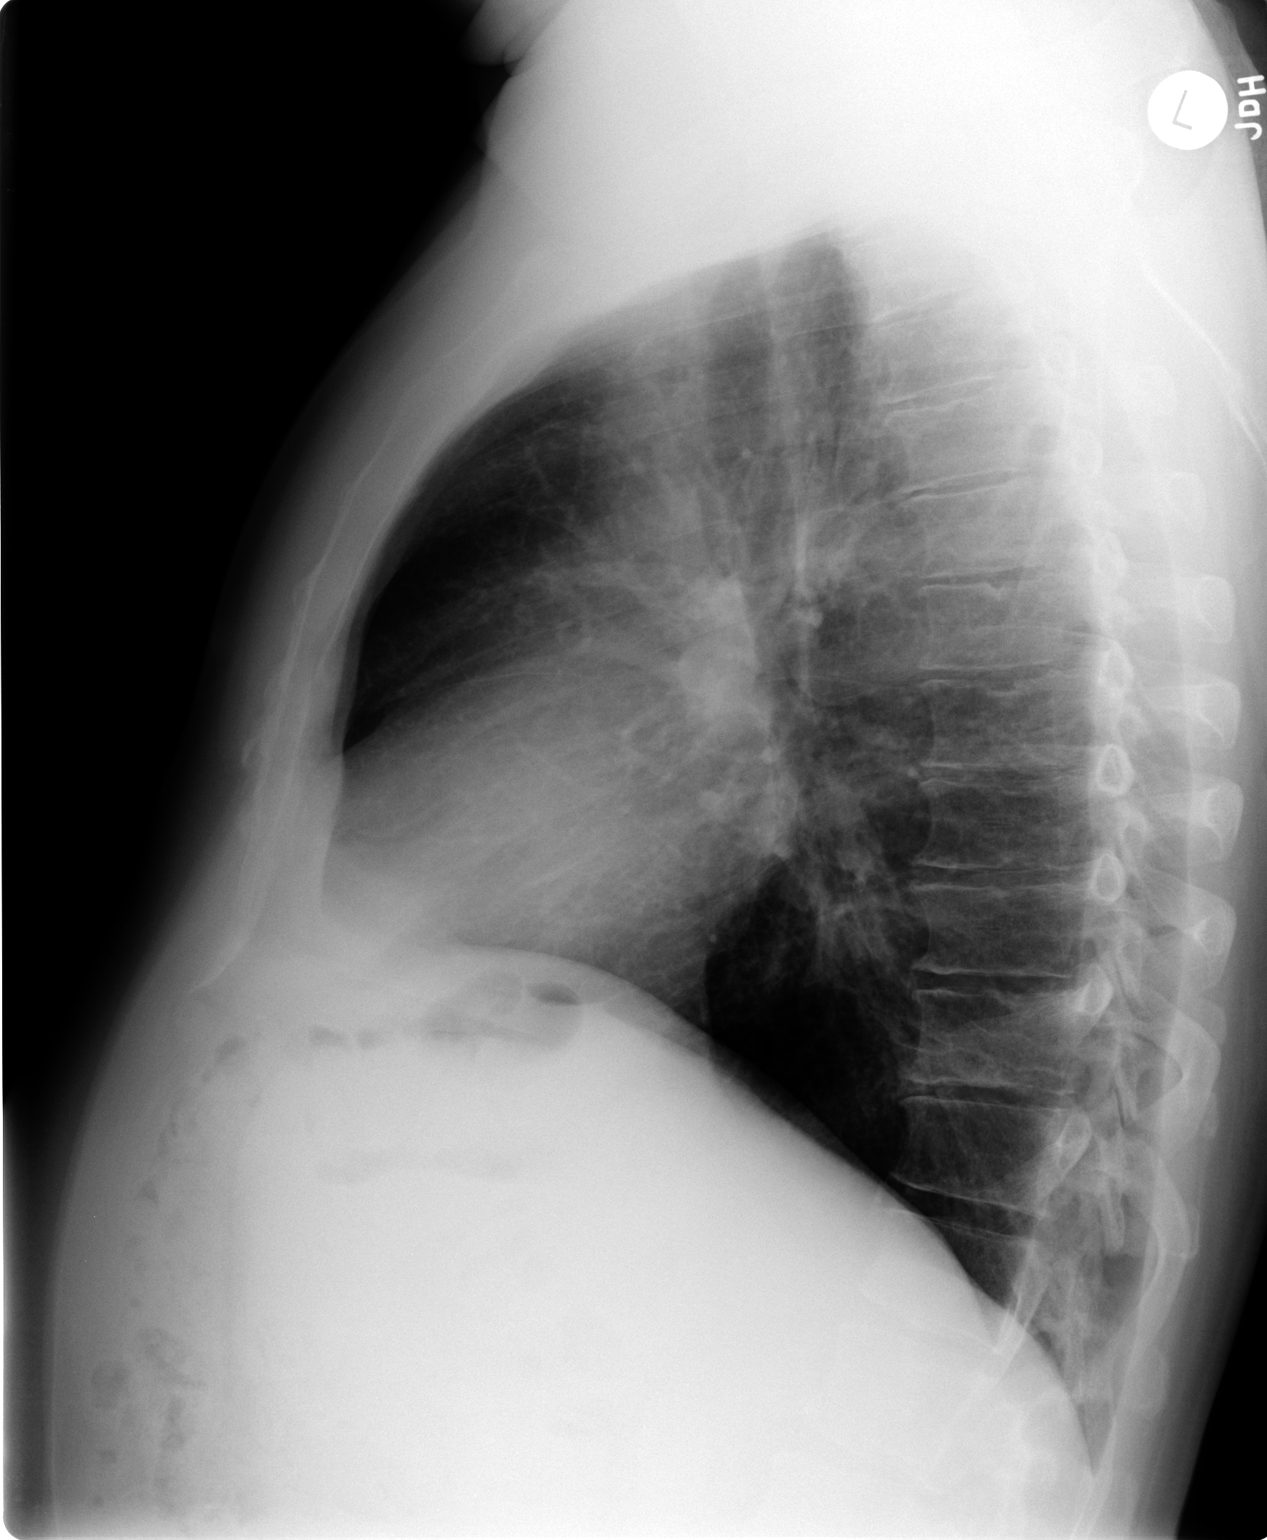

[2 of 2 positions shown; findings below may reference images not displayed]

FINDINGS: Mediastinum hilar structures are normal. Mild basilar subsegmental
atelectasis versus infiltrate. No pleural effusion or pneumothorax.
Heart size, vascularity normal. No acute osseous abnormality.
IMPRESSION: Mild bibasilar subsegmental atelectasis and/or infiltrates.

## 2014-10-22 NOTE — Progress Notes (Signed)
Subjective: Main concern today is testicular pain.   Feels like he has a strain of his testicle.  2 weeks ago noticed blood in semen with masturbating.   Researched on google.   Since then normal ejaculation.  Only happened once, but the last few days has had no pain, but has strain feeling in pelvis, dull feeling in abdomen.   Went hiking over the weekend, and felt this pull into abdomen.   Barely feels anything today.  Girlfriend made him come in to get checked out.  Few days along with this had low back pain. No recent injury or trauma.  No urinary problems or GI problems otherwise.  No prior similar, no blood in urine, no rectal pain, no concern for STD.   No hx/o prostatitis, no testicular lump or swelling.   No recent injury or trauma.   Still smoking.  Very concerned about this, frustrated he is unable to quit.   Clearly can't run a mile due SOB from smoking, but no change since last year.  At times chest pressure, anxiety.  Addicted to nicotine.  Addicted to routine/habit.  Has tried chantix, Wellbutrin. Has tried chantix by itself.   Has tried vaping.  Quit for 2 years when daughter was born.  Has been smoking since age 48yo.  Has probably a 30 pack year history.  Was exposed at early age to second hand smoke.   In general worried about his health.  Worries givne his inability to quit tobacco, mother's hx/o COPD, dad's hx/o heart disease.  In general doesn't feel good, feels like crap.   Is heavier than he would like, decreased exercise capacity.  Has been using Phentermine but not consistently.  He is ready to do better at weight loss attempts.  Needs to lose 25" lbs.   Past Medical History  Diagnosis Date  . GERD (gastroesophageal reflux disease)   . Tobacco use   . ED (erectile dysfunction)   . Anxiety     prior therapy, in remission  . Wears glasses   . 23-polyvalent pneumococcal polysaccharide vaccine declined 7/14   Family History  Problem Relation Age of Onset  . Heart failure  Father   . Heart disease Father 45    stent, CAD  . Sudden death Neg Hx   . Heart attack Neg Hx   . Hyperlipidemia Neg Hx   . Diabetes Neg Hx   . Hypertension Neg Hx   . Cancer Neg Hx   . Stroke Neg Hx   . COPD Mother     Objective: BP 132/80 mmHg  Pulse 80  Temp(Src) 97.9 F (36.6 C) (Oral)  Resp 16  Wt 221 lb (100.245 kg)  General appearence: alert, no distress, WD/WN Oral cavity: MMM, no lesions Neck: supple, no lymphadenopathy, no thyromegaly, no masses Heart: RRR, normal S1, S2, no murmurs Lungs: CTA bilaterally, no wheezes, rhonchi, or rales Abdomen: +bs, soft, non tender, non distended, no masses, no hepatomegaly, no splenomegaly Pulses: 2+ symmetric, upper and lower extremities, normal cap refill Back: nontender GU: normal male genitalia, circumcised, round nontender mass within spermatic cord on the right, likely spermatocele, similar round mobile lesion above the right testicular, mild tenderness of left testicle, no other obvious mass, no other abnormality, no hernia, no lymphadenopathy DRE: anus normal tone, prostate WNL   Assessment: Encounter Diagnoses  Name Primary?  . Testicular discomfort Yes  . Abnormal ejaculation   . Tobacco abuse disorder   . SOB (shortness of breath)   .  Malaise and fatigue   . Obesity   . Acute stress reaction    Plan: Testicular discomfort/abnormal ejaculation -discussed possible diagnoses, declines STD testing, no concern for STD.  Possible prostatitis, no obvious swelling or mass of either testicle also slightly tender of left testicle on exam.   Pending labs, consider Cipro for possible orchitis vs prostatitis.  Tobacco use disorder- failed numerous prior strategies including patches, Wellbutrin, Chantix.  Strongly advised he begin counseling, look into hypnosis or other alternative medicine therapies, he has requested prayer with this small group at church regarding addiction, restart nicotine patches.  Shortness of  breath-reviewed prior EKG and labs from his physical in July 2014, consider repeat physical at this time, we'll go ahead and refer for chest x-Farhaan and PFTs  Malaise-pending labs and further evaluation  Obesity-continue phentermine, exercise and diet measures  Acute stress reaction-discussed his concerns, evaluation, may need to consider other SSRI/benzos for symptoms going forward

## 2014-10-23 ENCOUNTER — Other Ambulatory Visit: Payer: Self-pay | Admitting: Family Medicine

## 2014-10-23 ENCOUNTER — Other Ambulatory Visit: Payer: Self-pay | Admitting: Medical

## 2014-10-23 DIAGNOSIS — N50819 Testicular pain, unspecified: Secondary | ICD-10-CM

## 2014-10-23 LAB — PSA: PSA: 0.28 ng/mL (ref <=4.00)

## 2014-10-23 MED ORDER — NICOTINE 21 MG/24HR TD PT24: 21.0000 mg | MEDICATED_PATCH | Freq: Every day | TRANSDERMAL | Status: DC

## 2014-10-23 MED ORDER — PHENTERMINE HCL 37.5 MG PO TABS: 37.5000 mg | ORAL_TABLET | Freq: Every day | ORAL | Status: DC

## 2014-10-23 MED ORDER — CIPROFLOXACIN HCL 500 MG PO TABS: 500.0000 mg | ORAL_TABLET | Freq: Two times a day (BID) | ORAL | Status: DC

## 2014-10-23 NOTE — Addendum Note (Signed)
Addended by: Christin Bach L on: 10/23/2014 11:12 AM   Modules accepted: Orders

## 2014-10-24 ENCOUNTER — Other Ambulatory Visit: Payer: Self-pay

## 2014-10-24 DIAGNOSIS — R0602 Shortness of breath: Secondary | ICD-10-CM

## 2014-10-24 LAB — GC/CHLAMYDIA PROBE AMP, URINE
CHLAMYDIA, SWAB/URINE, PCR: NEGATIVE
GC PROBE AMP, URINE: NEGATIVE

## 2014-11-05 ENCOUNTER — Other Ambulatory Visit: Payer: Self-pay | Admitting: Family Medicine

## 2014-11-05 ENCOUNTER — Other Ambulatory Visit: Payer: Self-pay | Admitting: Medical

## 2014-11-05 ENCOUNTER — Telehealth: Payer: Self-pay | Admitting: Internal Medicine

## 2014-11-05 ENCOUNTER — Ambulatory Visit: Payer: BC Managed Care – PPO | Admitting: Medical

## 2014-11-05 DIAGNOSIS — N50819 Testicular pain, unspecified: Secondary | ICD-10-CM

## 2014-11-05 NOTE — Telephone Encounter (Signed)
Ultrasound scheduled on 11/09/14 @ 830 am [place]. [place], [place]

## 2014-11-05 NOTE — Telephone Encounter (Signed)
Pt states that he is calling back due to his testicular pain. He is still having some tenderness, aching. He is not any better but as not gotten worse. He has taken 12 days of the antibiotic and he should finish up on Wednesday. He wants to know what can you do for him.

## 2014-11-05 NOTE — Telephone Encounter (Signed)
Patient is aware of the ultrasound appointment I fax over referral to [facility] Waiting on his appointment

## 2014-11-05 NOTE — Telephone Encounter (Signed)
At this point either refer for testicular ultrasound or referral to Urology regarding testicular pain

## 2014-11-07 ENCOUNTER — Telehealth: Payer: Self-pay | Admitting: Family Medicine

## 2014-11-07 NOTE — Telephone Encounter (Signed)
Patient called wanting to know about his ultrasound and who would let him know about his results. I explain to him that Dorothea Ogle would get the results and we would then contact him and let him know.

## 2014-11-09 ENCOUNTER — Ambulatory Visit
Admission: RE | Admit: 2014-11-09 | Discharge: 2014-11-09 | Disposition: A | Discharge status: Home / Self Care | Payer: BC Managed Care – PPO | Source: Ambulatory Visit | Attending: Medical | Admitting: Medical

## 2014-11-09 ENCOUNTER — Other Ambulatory Visit: Payer: Self-pay | Admitting: Medical

## 2014-11-09 DIAGNOSIS — N50819 Testicular pain, unspecified: Secondary | ICD-10-CM

## 2014-11-09 IMAGING — US US SCROTUM
1 series · 14 of 25 positions shown · non-contrast
Comparison: None.

CLINICAL DATA: Bilateral testicular pain for 2 weeks with some
improvement after antibiotics.

EXAM:
SCROTAL ULTRASOUND
DOPPLER ULTRASOUND OF THE TESTICLES
TECHNIQUE: Complete ultrasound examination of the testicles, epididymis, and
other scrotal structures was performed. Color and spectral Doppler
ultrasound were also utilized to evaluate blood flow to the
testicles.

[Series 1: us scrotum · 0.11mm/px · 14 of 36 slices shown]
[im 1/36]
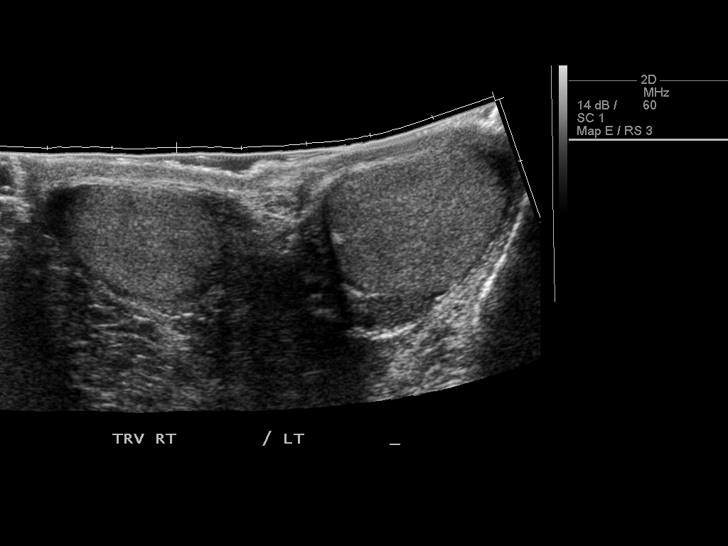
[im 3/36]
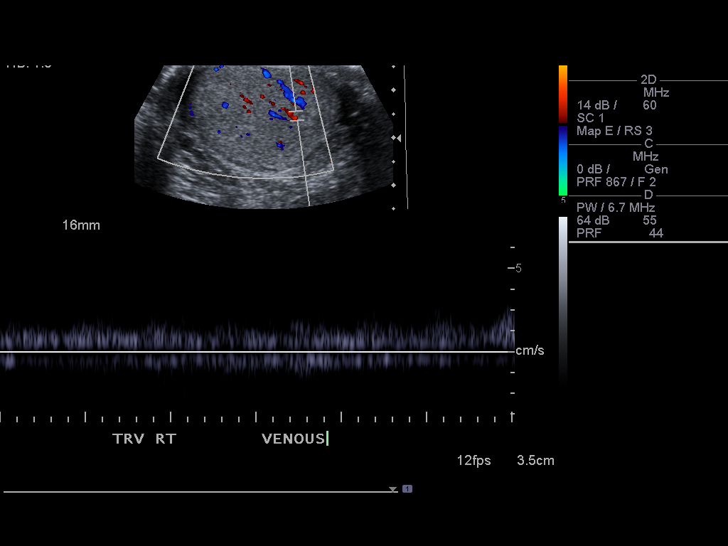
[im 6/36]
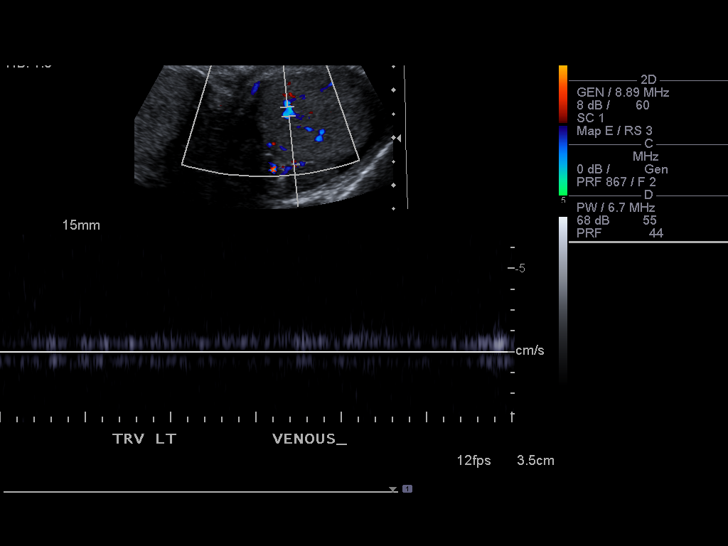
[im 9/36]
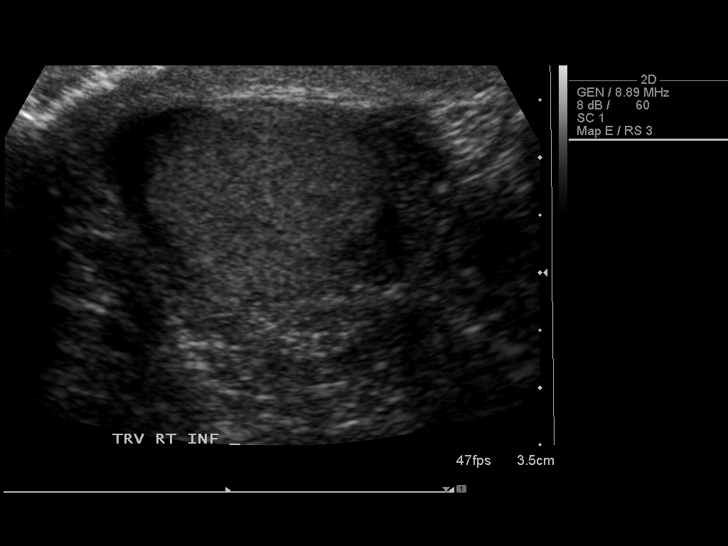
[im 12/36]
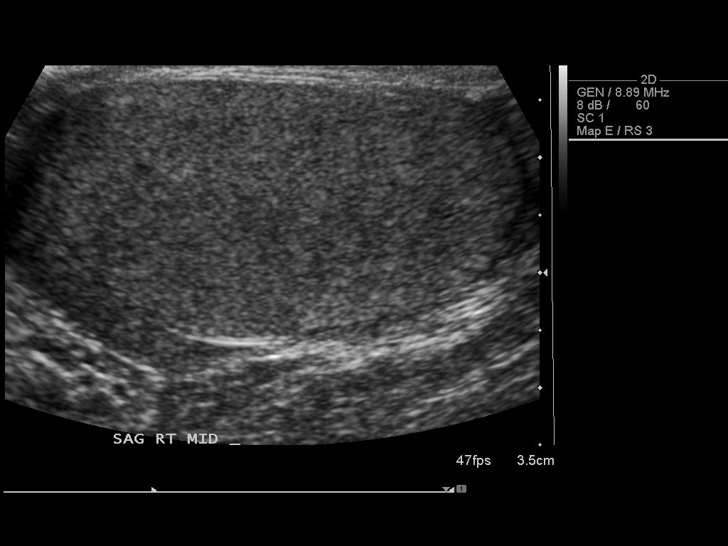
[im 14/36]
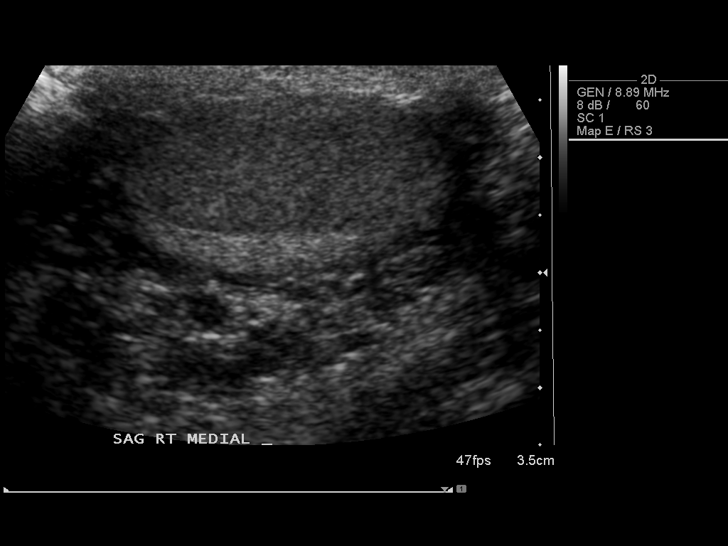
[im 17/36]
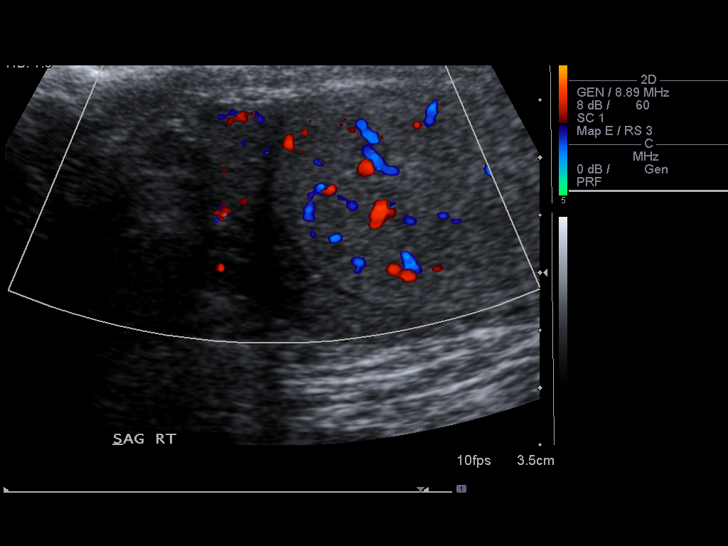
[im 19/36]
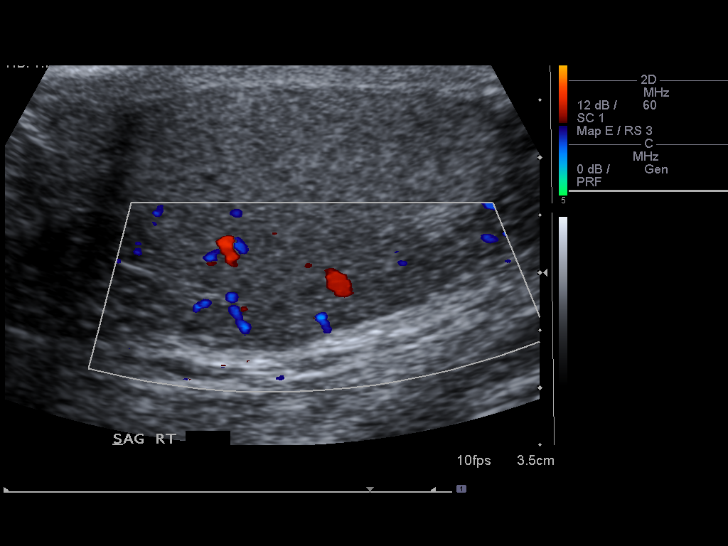
[im 22/36]
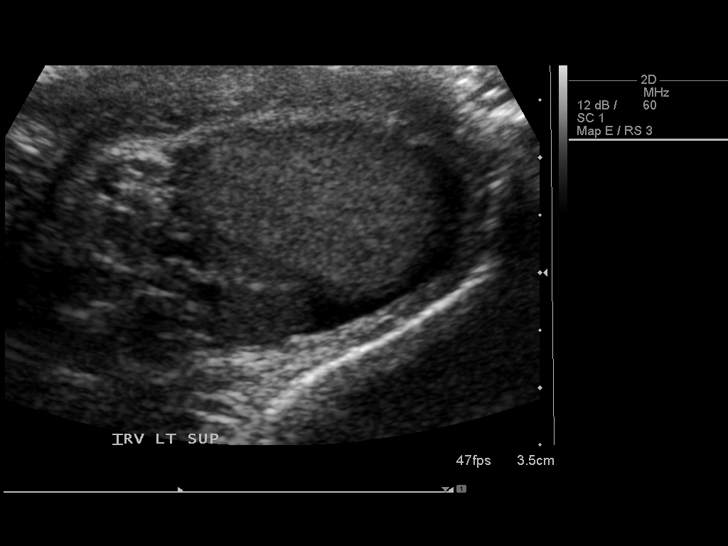
[im 24/36]
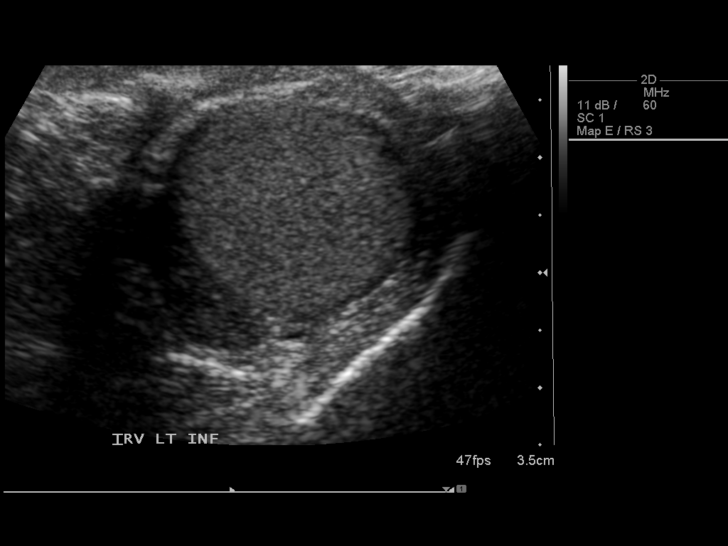
[im 27/36]
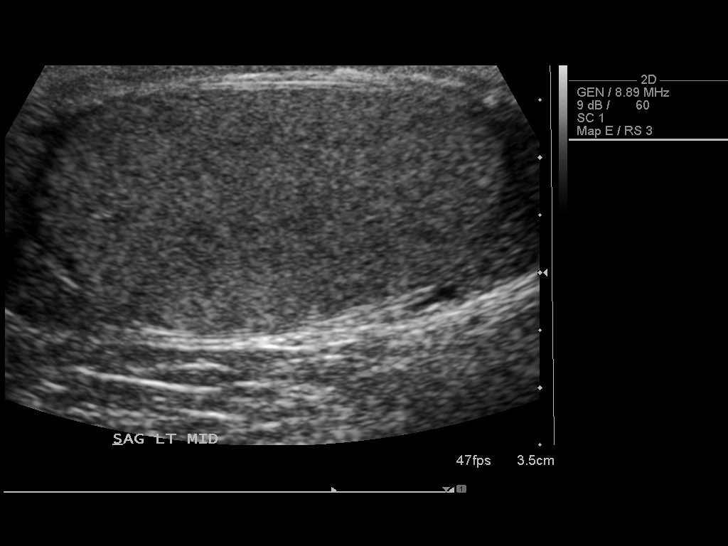
[im 30/36]
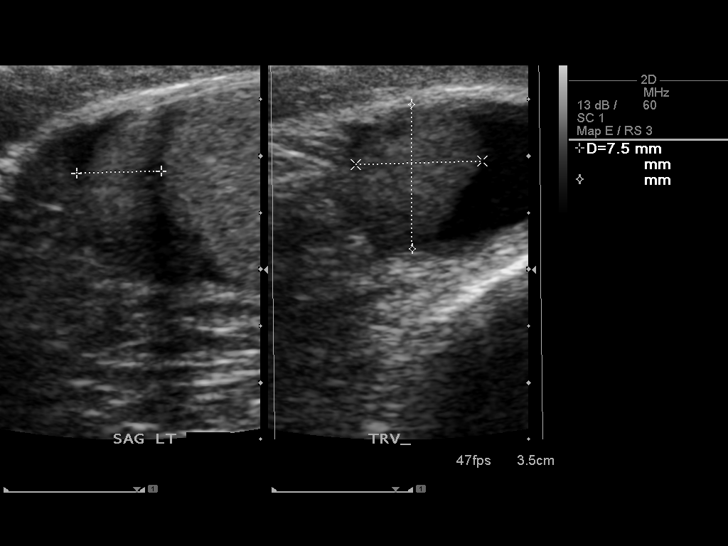
[im 33/36]
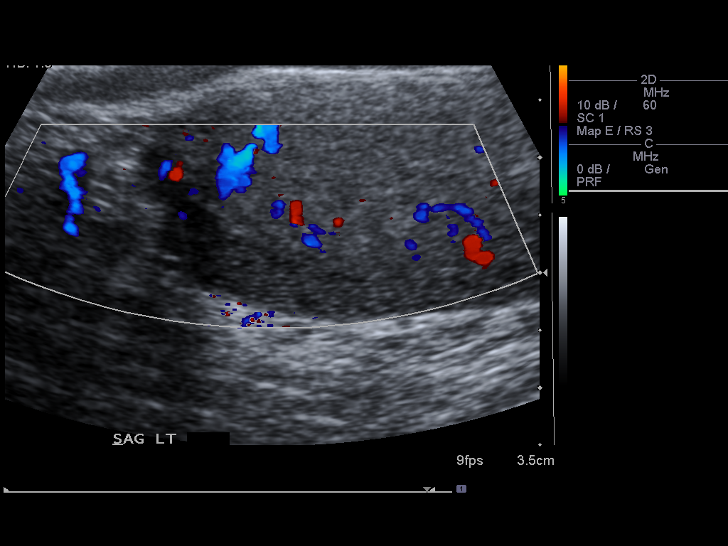
[im 36/36]
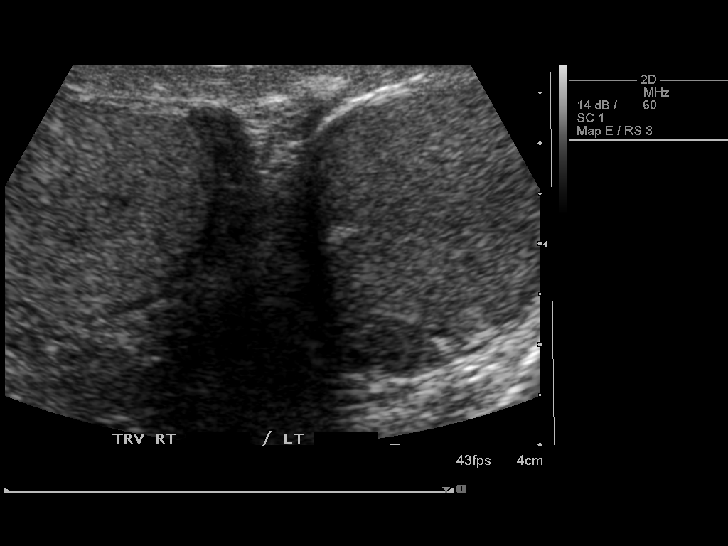

[14 of 25 positions shown; findings below may reference images not displayed]

FINDINGS: Right testicle

Measurements: 4.3 x 2.3 x 3.3 cm. Parenchymal echogenicity is
uniform. No mass or microlithiasis. Color Doppler flow is
identified.

Left testicle

Measurements: 4.1 x 2.2 x 2.9 cm. Parenchymal echogenicity is
uniform. No mass or microlithiasis. Color Doppler flow is
identified.

Right epididymis:  Normal in size and appearance.

Left epididymis:  Normal in size and appearance.

Hydrocele:  None visualized.

Varicocele:  None visualized.

Pulsed Doppler interrogation of both testes demonstrates low
resistance arterial and venous waveforms bilaterally.
IMPRESSION: Normal exam.

## 2014-11-09 MED ORDER — CIPROFLOXACIN HCL 500 MG PO TABS: 500.0000 mg | ORAL_TABLET | Freq: Two times a day (BID) | ORAL | Status: DC

## 2014-11-13 ENCOUNTER — Other Ambulatory Visit: Payer: Self-pay | Admitting: Family Medicine

## 2014-11-13 DIAGNOSIS — N50819 Testicular pain, unspecified: Secondary | ICD-10-CM

## 2014-12-25 ENCOUNTER — Telehealth: Payer: Self-pay | Admitting: Medical

## 2014-12-28 NOTE — Telephone Encounter (Signed)
P.A. Approved for Cialis for up to 8 tablets q month.  Pt informed and I faxed pharmacy

## 2015-01-24 ENCOUNTER — Ambulatory Visit (INDEPENDENT_AMBULATORY_CARE_PROVIDER_SITE_OTHER): Payer: BLUE CROSS/BLUE SHIELD | Admitting: Medical

## 2015-01-24 ENCOUNTER — Encounter: Payer: Self-pay | Admitting: Medical

## 2015-01-24 VITALS — BP 128/88 | HR 80 | Temp 98.7°F | Resp 15 | Wt 201.0 lb

## 2015-01-24 DIAGNOSIS — Z720 Tobacco use: Secondary | ICD-10-CM | POA: Diagnosis not present

## 2015-01-24 DIAGNOSIS — F430 Acute stress reaction: Secondary | ICD-10-CM

## 2015-01-24 DIAGNOSIS — N50819 Testicular pain, unspecified: Secondary | ICD-10-CM

## 2015-01-24 DIAGNOSIS — N509 Disorder of male genital organs, unspecified: Secondary | ICD-10-CM | POA: Diagnosis not present

## 2015-01-24 DIAGNOSIS — E663 Overweight: Secondary | ICD-10-CM

## 2015-01-24 DIAGNOSIS — F17200 Nicotine dependence, unspecified, uncomplicated: Secondary | ICD-10-CM

## 2015-01-24 MED ORDER — PHENTERMINE HCL 37.5 MG PO TABS: 37.5000 mg | ORAL_TABLET | Freq: Every day | ORAL | Status: DC

## 2015-01-24 NOTE — Progress Notes (Signed)
   Subjective:   Cody Tapia is a 49 y.o. male presenting on 01/24/2015 with Follow-up  Since last visit and taking phentermine with no side effects, seeing good improvement, has lost 20 pounds. Exercising regularly, eating healthy, has dropped pants size, feels much better.  Wants to continue this 1-2 more months.  Has a trip planned in [place] to the [place] in May.  Given the effort it takes to keep him strict on diet he has not pursued smoking cessation but plans to do this once he stops phentermine.  Saw urology at last visit and he continue on Cipro for over a month and between Cipro and anti-inflammatory finally the testicular discomfort resolved.  No other aggravating or relieving factors.    He called in a few weeks ago a concerns about his daughter, and her seeing gynecology recently back on birth control, and she is dealing with some stress and depression issues.  He has her seeing a counselor  No other complaint.  Past Medical History  Diagnosis Date  . GERD (gastroesophageal reflux disease)   . Tobacco use   . ED (erectile dysfunction)   . Anxiety     prior therapy, in remission  . Wears glasses   . 23-polyvalent pneumococcal polysaccharide vaccine declined 7/14     Review of Systems ROS as in subjective         Objective: BP 128/88 mmHg  Pulse 80  Temp(Src) 98.7 F (37.1 C) (Oral)  Resp 15  Wt 201 lb (91.173 kg)  General appearance: alert, no distress, WD/WN Neck: supple, no lymphadenopathy, no thyromegaly, no masses Heart: RRR, normal S1, S2, no murmurs Lungs: CTA bilaterally, no wheezes, rhonchi, or rales Pulses: 2+ symmetric, upper and lower extremities, normal cap refill Ext: no edema     Assessment: Encounter Diagnoses  Name Primary?  Marland Kitchen Overweight Yes  . Smoker   . Acute stress reaction   . Testicular discomfort      Plan: Edwin Dada has now moved into BMI under 30, continue phentermine, the goal would be to stay on  phentermine in another 1-2 months and transition off the medication or if needed switch to something like Belviq or Qsymia for longer-term management.  Continue healthy diet and exercise  Smoker-numerous failed prior attempts, frustration, but he plans to pursue this primarily once he comes off phentermine  Acute stress reaction-improved  Testicular discomfort-resolved, saw urology after last visit  Attilio was seen today for follow-up.  Diagnoses and all orders for this visit:  Overweight  Smoker  Acute stress reaction  Testicular discomfort  Other orders -     phentermine (ADIPEX-P) 37.5 MG tablet; Take 1 tablet (37.5 mg total) by mouth daily before breakfast.   Return in about 2 months (around 03/26/2015).

## 2015-03-07 ENCOUNTER — Other Ambulatory Visit: Payer: Self-pay | Admitting: Medical

## 2015-03-07 NOTE — Telephone Encounter (Signed)
Ok to rf? 

## 2015-03-22 ENCOUNTER — Other Ambulatory Visit: Payer: Self-pay | Admitting: Medical

## 2015-04-08 ENCOUNTER — Other Ambulatory Visit: Payer: Self-pay | Admitting: Family Medicine

## 2015-04-11 ENCOUNTER — Encounter: Payer: Self-pay | Admitting: Medical

## 2015-04-11 ENCOUNTER — Ambulatory Visit (INDEPENDENT_AMBULATORY_CARE_PROVIDER_SITE_OTHER): Payer: BLUE CROSS/BLUE SHIELD | Admitting: Medical

## 2015-04-11 VITALS — BP 134/80 | HR 64 | Temp 98.4°F | Resp 16 | Wt 202.0 lb

## 2015-04-11 DIAGNOSIS — Z79899 Other long term (current) drug therapy: Secondary | ICD-10-CM

## 2015-04-11 DIAGNOSIS — Z7189 Other specified counseling: Secondary | ICD-10-CM | POA: Diagnosis not present

## 2015-04-11 DIAGNOSIS — E663 Overweight: Secondary | ICD-10-CM | POA: Diagnosis not present

## 2015-04-11 MED ORDER — PHENTERMINE HCL 37.5 MG PO TABS: 37.5000 mg | ORAL_TABLET | Freq: Every day | ORAL | Status: DC

## 2015-04-11 NOTE — Progress Notes (Signed)
   Subjective:   Cody Tapia is a 49 y.o. male presenting on 04/11/2015 with follow up  Here for recheck on weight loss efforts.  Ran out of phentermine few days ago.  Exercise - does cardio and weights most weekday mornings.  Sometimes exercises on Saturday mornings, active in general. Elliptical 20-30 minutes about 5 days per week.  Starts medium, works up to higher intensity.  Heart rate is typically around 120.  Went on vacation in May.  Has strict diet, but cheated while on vacation for 5 days, but prior to vacation was doing fine.   Probably gained 5lb on vacation.  Water intake is good, drinks water primarily.  Goal - wants to be at 190lb.  Current waist size is 34 in.  Since last visit and taking phentermine with no side effects, seeing good improvement. No other aggravating or relieving factors.    No other complaint.  Past Medical History  Diagnosis Date  . GERD (gastroesophageal reflux disease)   . Tobacco use   . ED (erectile dysfunction)   . Anxiety     prior therapy, in remission  . Wears glasses   . 23-polyvalent pneumococcal polysaccharide vaccine declined 7/14     Review of Systems ROS as in subjective         Objective: BP 134/80 mmHg  Pulse 64  Temp(Src) 98.4 F (36.9 C) (Oral)  Resp 16  Wt 202 lb (91.627 kg)  Wt Readings from Last 3 Encounters:  04/11/15 202 lb (91.627 kg)  01/24/15 201 lb (91.173 kg)  10/22/14 221 lb (100.245 kg)    General appearance: alert, no distress, WD/WN Neck: supple, no lymphadenopathy, no thyromegaly, no masses Heart: RRR, normal S1, S2, no murmurs Lungs: CTA bilaterally, no wheezes, rhonchi, or rales Pulses: 2+ symmetric, upper and lower extremities, normal cap refill Ext: no edema     Assessment: Encounter Diagnoses  Name Primary?  Marland Kitchen Overweight Yes  . High risk medication use   . Counseling on health promotion and disease prevention      Plan: C/t phentermine for 1-2 more months.  Discussed exercise  strategies, target heart rate for exercise, diet and goals.  He will check on insurance coverage for weight loss maintenance medications.   F/u 1-2 mo for physical and fasting labs.  Cody Tapia was seen today for follow up.  Diagnoses and all orders for this visit:  Overweight  High risk medication use  Counseling on health promotion and disease prevention  Other orders -     phentermine (ADIPEX-P) 37.5 MG tablet; Take 1 tablet (37.5 mg total) by mouth daily before breakfast.  Return 1-43mo.

## 2015-04-11 NOTE — Patient Instructions (Signed)
Check insurance coverage for the following:  Qsymia  Contrave  Belviq  Saxenda  For fat burning/weight loss, try and make a target of 120 bpm when exercising Continue healthy diet Try longer period of time on the elliptical such as 50-60 minutes daily

## 2015-10-12 ENCOUNTER — Other Ambulatory Visit: Payer: Self-pay | Admitting: Medical

## 2015-11-07 ENCOUNTER — Encounter: Payer: Self-pay | Admitting: Medical

## 2015-11-07 ENCOUNTER — Ambulatory Visit (INDEPENDENT_AMBULATORY_CARE_PROVIDER_SITE_OTHER): Payer: BLUE CROSS/BLUE SHIELD | Admitting: Medical

## 2015-11-07 VITALS — BP 130/92 | HR 88 | Wt 211.0 lb

## 2015-11-07 DIAGNOSIS — K219 Gastro-esophageal reflux disease without esophagitis: Secondary | ICD-10-CM | POA: Insufficient documentation

## 2015-11-07 DIAGNOSIS — Z87438 Personal history of other diseases of male genital organs: Secondary | ICD-10-CM | POA: Diagnosis not present

## 2015-11-07 DIAGNOSIS — T753XXA Motion sickness, initial encounter: Secondary | ICD-10-CM | POA: Diagnosis not present

## 2015-11-07 DIAGNOSIS — Z720 Tobacco use: Secondary | ICD-10-CM | POA: Diagnosis not present

## 2015-11-07 DIAGNOSIS — E663 Overweight: Secondary | ICD-10-CM

## 2015-11-07 DIAGNOSIS — Z0000 Encounter for general adult medical examination without abnormal findings: Secondary | ICD-10-CM | POA: Diagnosis not present

## 2015-11-07 DIAGNOSIS — N529 Male erectile dysfunction, unspecified: Secondary | ICD-10-CM | POA: Insufficient documentation

## 2015-11-07 LAB — POCT URINALYSIS DIPSTICK
Bilirubin, UA: NEGATIVE
GLUCOSE UA: NEGATIVE
Ketones, UA: NEGATIVE
Leukocytes, UA: NEGATIVE
Nitrite, UA: NEGATIVE
Protein, UA: NEGATIVE
RBC UA: NEGATIVE
Spec Grav, UA: 1.01
Urobilinogen, UA: NEGATIVE
pH, UA: 6.5

## 2015-11-07 LAB — LIPID PANEL
Cholesterol: 189 mg/dL (ref 125–200)
HDL: 60 mg/dL (ref >=40)
LDL Cholesterol: 109 mg/dL (ref <130)
Total CHOL/HDL Ratio: 3.2 Ratio (ref <=5.0)
Triglycerides: 100 mg/dL (ref <150)
VLDL: 20 mg/dL (ref <30)

## 2015-11-07 LAB — CBC
HEMATOCRIT: 44.9 % (ref 39.0–52.0)
HEMOGLOBIN: 15.1 g/dL (ref 13.0–17.0)
MCH: 31.4 pg (ref 26.0–34.0)
MCHC: 33.6 g/dL (ref 30.0–36.0)
MCV: 93.3 fL (ref 78.0–100.0)
MPV: 9.6 fL (ref 8.6–12.4)
Platelets: 261 10*3/uL (ref 150–400)
RBC: 4.81 MIL/uL (ref 4.22–5.81)
RDW: 13.6 % (ref 11.5–15.5)
WBC: 8.4 10*3/uL (ref 4.0–10.5)

## 2015-11-07 LAB — COMPREHENSIVE METABOLIC PANEL
ALBUMIN: 4.3 g/dL (ref 3.6–5.1)
ALT: 25 U/L (ref 9–46)
AST: 31 U/L (ref 10–40)
Alkaline Phosphatase: 76 U/L (ref 40–115)
BUN: 15 mg/dL (ref 7–25)
CO2: 25 mmol/L (ref 20–31)
CREATININE: 0.99 mg/dL (ref 0.60–1.35)
Calcium: 9.3 mg/dL (ref 8.6–10.3)
Chloride: 103 mmol/L (ref 98–110)
Glucose, Bld: 84 mg/dL (ref 65–99)
Potassium: 4.1 mmol/L (ref 3.5–5.3)
SODIUM: 138 mmol/L (ref 135–146)
Total Bilirubin: 0.7 mg/dL (ref 0.2–1.2)
Total Protein: 7.2 g/dL (ref 6.1–8.1)

## 2015-11-07 MED ORDER — PANTOPRAZOLE SODIUM 40 MG PO TBEC: 40.0000 mg | DELAYED_RELEASE_TABLET | Freq: Every day | ORAL | Status: DC

## 2015-11-07 MED ORDER — SCOPOLAMINE 1 MG/3DAYS TD PT72: 1.0000 | MEDICATED_PATCH | TRANSDERMAL | Status: DC

## 2015-11-07 MED ORDER — NICOTINE 21 MG/24HR TD PT24: 21.0000 mg | MEDICATED_PATCH | Freq: Every day | TRANSDERMAL | Status: DC

## 2015-11-07 NOTE — Progress Notes (Signed)
Subjective:   HPI  Cody Tapia is a 49 y.o. male who presents for a complete physical.   Only concern today is refills and wants patch for motion sickness.  Is going on a cruise in about 1.5 weeks to the [place] with his family.   Been working with a Clinical research associate, exercising, feeling good.  Still smoking though.  Reviewed their medical, surgical, family, social, medication, and allergy history and updated chart as appropriate.   Past Medical History  Diagnosis Date  . GERD (gastroesophageal reflux disease)   . Tobacco use   . ED (erectile dysfunction)   . Anxiety     prior therapy, in remission  . Wears glasses   . 23-polyvalent pneumococcal polysaccharide vaccine declined 7/14    Past Surgical History  Procedure Laterality Date  . Eye surgery    . Wisdom tooth extraction    . Colonoscopy  2009    [facility] for abdominal pain  . Esophagogastroduodenoscopy  2009    [facility], abdominal pain    Family History  Problem Relation Age of Onset  . Heart failure Father   . Heart disease Father 28    stent, CAD  . Sudden death Neg Hx   . Heart attack Neg Hx   . Hyperlipidemia Neg Hx   . Diabetes Neg Hx   . Hypertension Neg Hx   . Cancer Neg Hx   . Stroke Neg Hx   . COPD Mother     Social History   Social History  . Marital Status: Single    Spouse Name: N/A  . Number of Children: N/A  . Years of Education: N/A   Occupational History  . Not on file.   Social History Main Topics  . Smoking status: Current Every Day Smoker -- 1.00 packs/day for 20 years  . Smokeless tobacco: Not on file     Comment: Quit 1 month ago  . Alcohol Use: 1.8 oz/week    0 Cans of beer, 3 Glasses of wine per week  . Drug Use: No  . Sexual Activity: Not on file   Other Topics Concern  . Not on file   Social History Narrative   Girlfriend, domestic partner, teenage daughter.   Exercise - cardio, elliptical, power walking, free weights 3 days per week.  Working out  with Physiological scientist.  Mortgage lending x 20 years.      Current Outpatient Prescriptions on File Prior to Visit  Medication Sig Dispense Refill  . CIALIS 20 MG tablet TAKE 1 TABLET (20 MG TOTAL) BY MOUTH DAILY AS NEEDED FOR ERECTILE DYSFUNCTION. 6 tablet 10  . Multiple Vitamin (MULTIVITAMIN) tablet Take 1 tablet by mouth daily.    . nicotine (NICODERM CQ - DOSED IN MG/24 HOURS) 21 mg/24hr patch Place 1 patch (21 mg total) onto the skin daily. (Patient not taking: Reported on 11/07/2015) 28 patch 1  . phentermine (ADIPEX-P) 37.5 MG tablet Take 1 tablet (37.5 mg total) by mouth daily before breakfast. (Patient not taking: Reported on 11/07/2015) 30 tablet 1   No current facility-administered medications on file prior to visit.    Allergies  Allergen Reactions  . Viagra [Sildenafil Citrate]     headache  . Wellbutrin [Bupropion]     Sleepwalking, intolerance     Review of Systems Constitutional: -fever, -chills, -sweats, -unexpected weight change, -decreased appetite, -fatigue Allergy: -sneezing, -itching, -congestion Dermatology: -changing moles, --rash, -lumps ENT: -runny nose, -ear pain, -sore throat, -hoarseness, -sinus pain, -teeth pain, -  ringing in ears, -hearing loss, -nosebleeds Cardiology: -chest pain, -palpitations, -swelling, -difficulty breathing when lying flat, -waking up short of breath Respiratory: -cough, -shortness of breath, -difficulty breathing with exercise or exertion, -wheezing, -coughing up blood Gastroenterology: -abdominal pain, -nausea, -vomiting, -diarrhea, -constipation, -blood in stool, -changes in bowel movement, -difficulty swallowing or eating Hematology: -bleeding, -bruising  Musculoskeletal: -joint aches, -muscle aches, -joint swelling, -back pain, -neck pain, -cramping, -changes in gait Ophthalmology: -vision changes, eye redness, itching, discharge Urology: -burning with urination, -difficulty urinating, -blood in urine, -urinary frequency,  -urgency, -incontinence Neurology: -headache, -weakness, -tingling, -numbness, -memory loss, -falls, -dizziness Psychology: -depressed mood, -agitation, -sleep problems     Objective:   Physical Exam  Vitals and nurse notes reviewed  General appearance: alert, no distress, WD/WN, white male Skin: brown somewhat irregular border right anterolateral upper arm 71mm diameter, flat, otherwise scattered benign appearing macules, freckles throughout, no worrisome lesions HEENT: normocephalic, conjunctiva/corneas normal, sclerae anicteric, PERRLA, EOMi, nares patent, no discharge or erythema, pharynx normal Oral cavity: MMM, tongue normal, teeth in good repair Neck: supple, no lymphadenopathy, no thyromegaly, no masses, normal ROM, no bruits  Chest: non tender, normal shape and expansion Heart: RRR, normal S1, S2, no murmurs Lungs: CTA bilaterally, no wheezes, rhonchi, or rales Abdomen: +bs, soft, non tender, non distended, no masses, no hepatomegaly, no splenomegaly, no bruits Back: non tender, normal ROM, no scoliosis Musculoskeletal: upper extremities non tender, no obvious deformity, normal ROM throughout, lower extremities non tender, no obvious deformity, normal ROM throughout Extremities: no edema, no cyanosis, no clubbing Pulses: 2+ symmetric, upper and lower extremities, normal cap refill Neurological: alert, oriented x 3, CN2-12 intact, strength normal upper extremities and lower extremities, sensation normal throughout, DTRs 2+ throughout, no cerebellar signs, gait normal Psychiatric: normal affect, behavior normal, pleasant  GU: normal male external genitalia, circumcised, nontender, no masses, no hernia, no lymphadenopathy Rectal: deferred, declines  Assessment and Plan :      Encounter Diagnoses  Name Primary?  . Routine general medical examination at a health care facility Yes  . Tobacco abuse disorder   . Overweight   . Motion sickness, initial encounter   .  Gastroesophageal reflux disease without esophagitis   . Erectile dysfunction, unspecified erectile dysfunction type     Physical exam - discussed healthy lifestyle, diet, exercise, preventative care, vaccinations, and addressed their concerns.   Tobacco use -advised cessation Routine labs today ED - does well on Cialis, c/t same medication Declines flu shot GERD - discussed risk/benefits of long term medication.  He wants to c/t Protonix.  Rx for scopolamine patch for prn use.   Discussed proper use, risks/benefits.  Follow-up pending labs

## 2015-11-08 LAB — HEMOGLOBIN A1C
Hgb A1c MFr Bld: 5.6 % (ref <5.7)
Mean Plasma Glucose: 114 mg/dL (ref <117)

## 2015-11-12 ENCOUNTER — Telehealth: Payer: Self-pay | Admitting: Medical

## 2015-11-12 DIAGNOSIS — T753XXA Motion sickness, initial encounter: Secondary | ICD-10-CM

## 2015-11-12 MED ORDER — SCOPOLAMINE 1 MG/3DAYS TD PT72: 1.0000 | MEDICATED_PATCH | TRANSDERMAL | Status: DC

## 2015-11-12 NOTE — Telephone Encounter (Signed)
Sent to [facility] near his work

## 2015-11-12 NOTE — Telephone Encounter (Signed)
Pt was told by pharmacy that Transderm patches for nausea are no longer available. Pt needs to get something for nausea before he leaves for his trip on Monday so he would like to pick this up by the end of this week

## 2015-11-12 NOTE — Telephone Encounter (Signed)
pls call.  This is unusual.  I am not aware of any back order on Scopolamine patches.  If his pharmacy doesn't have this, we may need to call it in somewhere else.  Let me know ASAP if problems.

## 2015-12-09 ENCOUNTER — Ambulatory Visit (INDEPENDENT_AMBULATORY_CARE_PROVIDER_SITE_OTHER): Payer: BLUE CROSS/BLUE SHIELD | Admitting: Family Medicine

## 2015-12-09 ENCOUNTER — Encounter: Payer: Self-pay | Admitting: Family Medicine

## 2015-12-09 VITALS — BP 116/68 | HR 64 | Temp 98.1°F | Resp 16 | Wt 211.0 lb

## 2015-12-09 DIAGNOSIS — J069 Acute upper respiratory infection, unspecified: Secondary | ICD-10-CM | POA: Diagnosis not present

## 2015-12-09 DIAGNOSIS — J0140 Acute pansinusitis, unspecified: Secondary | ICD-10-CM

## 2015-12-09 MED ORDER — AMOXICILLIN 875 MG PO TABS: 875.0000 mg | ORAL_TABLET | Freq: Two times a day (BID) | ORAL | Status: DC

## 2015-12-09 NOTE — Progress Notes (Signed)
Subjective:  Cody Tapia is a 50 y.o. male who presents for URI symptoms.  Symptoms include cough, chest congestion for past 12 days. His symptoms started with sneezing, runny nose, cough.  States he was feeling better and then got worse. Cough has improved but states he feels like his drainage, headache and sinus congestion is the worst.  Denies fever, chills, bodyaches, sore throat, ear pain.  Treatment to date: decongestants.  Denies sick contacts. Does smoke daily. No recent antibiotic use.  No other aggravating or relieving factors.  No other c/o.  ROS as in subjective.   Objective: Filed Vitals:   12/09/15 1004  BP: 116/68  Pulse: 64  Temp: 98.1 F (36.7 C)  Resp: 16    General appearance: Alert, WD/WN, no distress, mildly ill appearing                             Skin: warm, no rash                           Head: mild sinus tenderness                            Eyes: conjunctiva normal, corneas clear, PERRLA                            Ears: pearly TMs, external ear canals normal                          Nose: septum midline, turbinates swollen, with erythema and clear discharge             Mouth/throat: MMM, tongue normal, mild pharyngeal erythema                           Neck: supple, no adenopathy, no thyromegaly, nontender                          Heart: RRR, normal S1, S2, no murmurs                         Lungs: CTA bilaterally, no wheezes, rales, or rhonchi     Assessment:  Acute pansinusitis, recurrence not specified  Acute upper respiratory infection   Plan: Discussed diagnosis and treatment of sinusitis.  Amoxil prescription sent to pharmacy. Instructions to stay well hydrated. Suggested symptomatic OTC remedies. Nasal saline spray for congestion.  Tylenol or Ibuprofen OTC for fever and malaise.  Call/return after completing antibiotic if symptoms aren't fully resolved.

## 2015-12-09 NOTE — Patient Instructions (Signed)

## 2015-12-17 ENCOUNTER — Telehealth: Payer: Self-pay | Admitting: Medical

## 2015-12-17 NOTE — Telephone Encounter (Signed)
Please call and tell him thank you for the kind referral

## 2015-12-18 ENCOUNTER — Telehealth: Payer: Self-pay | Admitting: Family Medicine

## 2015-12-18 NOTE — Telephone Encounter (Signed)
Find out if he completed his antibiotic and if his symptoms improved. When did dizziness start? Is he lightheaded or having a spinning sensation? Make sure he is staying well hydrated and he can try sudafed for decongestant if he does ok with this medicine.

## 2015-12-18 NOTE — Telephone Encounter (Signed)
Pt was notified.  

## 2015-12-18 NOTE — Telephone Encounter (Signed)
Left message for pt

## 2015-12-18 NOTE — Telephone Encounter (Signed)
Pt called and states that he is getting dizzy at times, states that he says he remembers you saying that he had fluid behind his ear so he is not for sure if that is what is causing it, says it don't happen often,. Says he is still having some nasal congestion,. Says he is still taking the antibiotic, said please advise, said he has had this cold for about 3 weeks now, said the coughing has gotten better, pt can be reached at [phone]  And pt uses [id number] [address], [place]

## 2016-01-26 ENCOUNTER — Telehealth: Payer: Self-pay | Admitting: Medical

## 2016-02-13 NOTE — Telephone Encounter (Signed)
New P.A. Cialis completed.  No samples available.  Called pt & informed

## 2016-02-13 NOTE — Telephone Encounter (Signed)
Recv'd response back that was duplicate request and request was cancelled. Called [facility] T# [phone] said didn't know why it rejected but now unable to complete online because states no active coverage.  Verified that he does have active coverage and ID same.  She is faxing a new form for completion.

## 2016-02-14 NOTE — Telephone Encounter (Signed)
Ins company called about P.A. and stated they have pt as R J and had to verify was correct pt

## 2016-02-27 ENCOUNTER — Other Ambulatory Visit: Payer: Self-pay | Admitting: Medical

## 2016-02-27 ENCOUNTER — Telehealth: Payer: Self-pay | Admitting: Medical

## 2016-02-27 MED ORDER — TADALAFIL 20 MG PO TABS: ORAL_TABLET | ORAL | Status: DC

## 2016-02-27 NOTE — Telephone Encounter (Signed)
Recv'd approval for P.A. CIALIS 20mg  til 02/12/17 up to 8 tablets per month,  Called pt and he wants Rx changed to #8 per month.  Please change quantity if this is ok?

## 2016-03-02 NOTE — Telephone Encounter (Signed)
P.A. Approved til 02/12/17, see other telephone call

## 2016-04-22 ENCOUNTER — Encounter: Payer: Self-pay | Admitting: Medical

## 2016-04-22 ENCOUNTER — Ambulatory Visit (INDEPENDENT_AMBULATORY_CARE_PROVIDER_SITE_OTHER): Payer: BLUE CROSS/BLUE SHIELD | Admitting: Medical

## 2016-04-22 ENCOUNTER — Ambulatory Visit
Admission: RE | Admit: 2016-04-22 | Discharge: 2016-04-22 | Disposition: A | Discharge status: Home / Self Care | Payer: BLUE CROSS/BLUE SHIELD | Source: Ambulatory Visit | Attending: Medical | Admitting: Medical

## 2016-04-22 VITALS — BP 140/90 | HR 74 | Temp 97.6°F | Resp 18 | Wt 217.0 lb

## 2016-04-22 DIAGNOSIS — F17200 Nicotine dependence, unspecified, uncomplicated: Secondary | ICD-10-CM

## 2016-04-22 DIAGNOSIS — Z720 Tobacco use: Secondary | ICD-10-CM | POA: Diagnosis not present

## 2016-04-22 DIAGNOSIS — R079 Chest pain, unspecified: Secondary | ICD-10-CM | POA: Diagnosis not present

## 2016-04-22 DIAGNOSIS — R0789 Other chest pain: Secondary | ICD-10-CM

## 2016-04-22 DIAGNOSIS — R059 Cough, unspecified: Secondary | ICD-10-CM

## 2016-04-22 DIAGNOSIS — R05 Cough: Secondary | ICD-10-CM

## 2016-04-22 IMAGING — CR DG CHEST 2V
2 series · 2 of 2 positions shown · non-contrast
Comparison: [DATE]

CLINICAL DATA: Chest pain 2 weeks

EXAM:
CHEST  2 VIEW

[w chest pa]
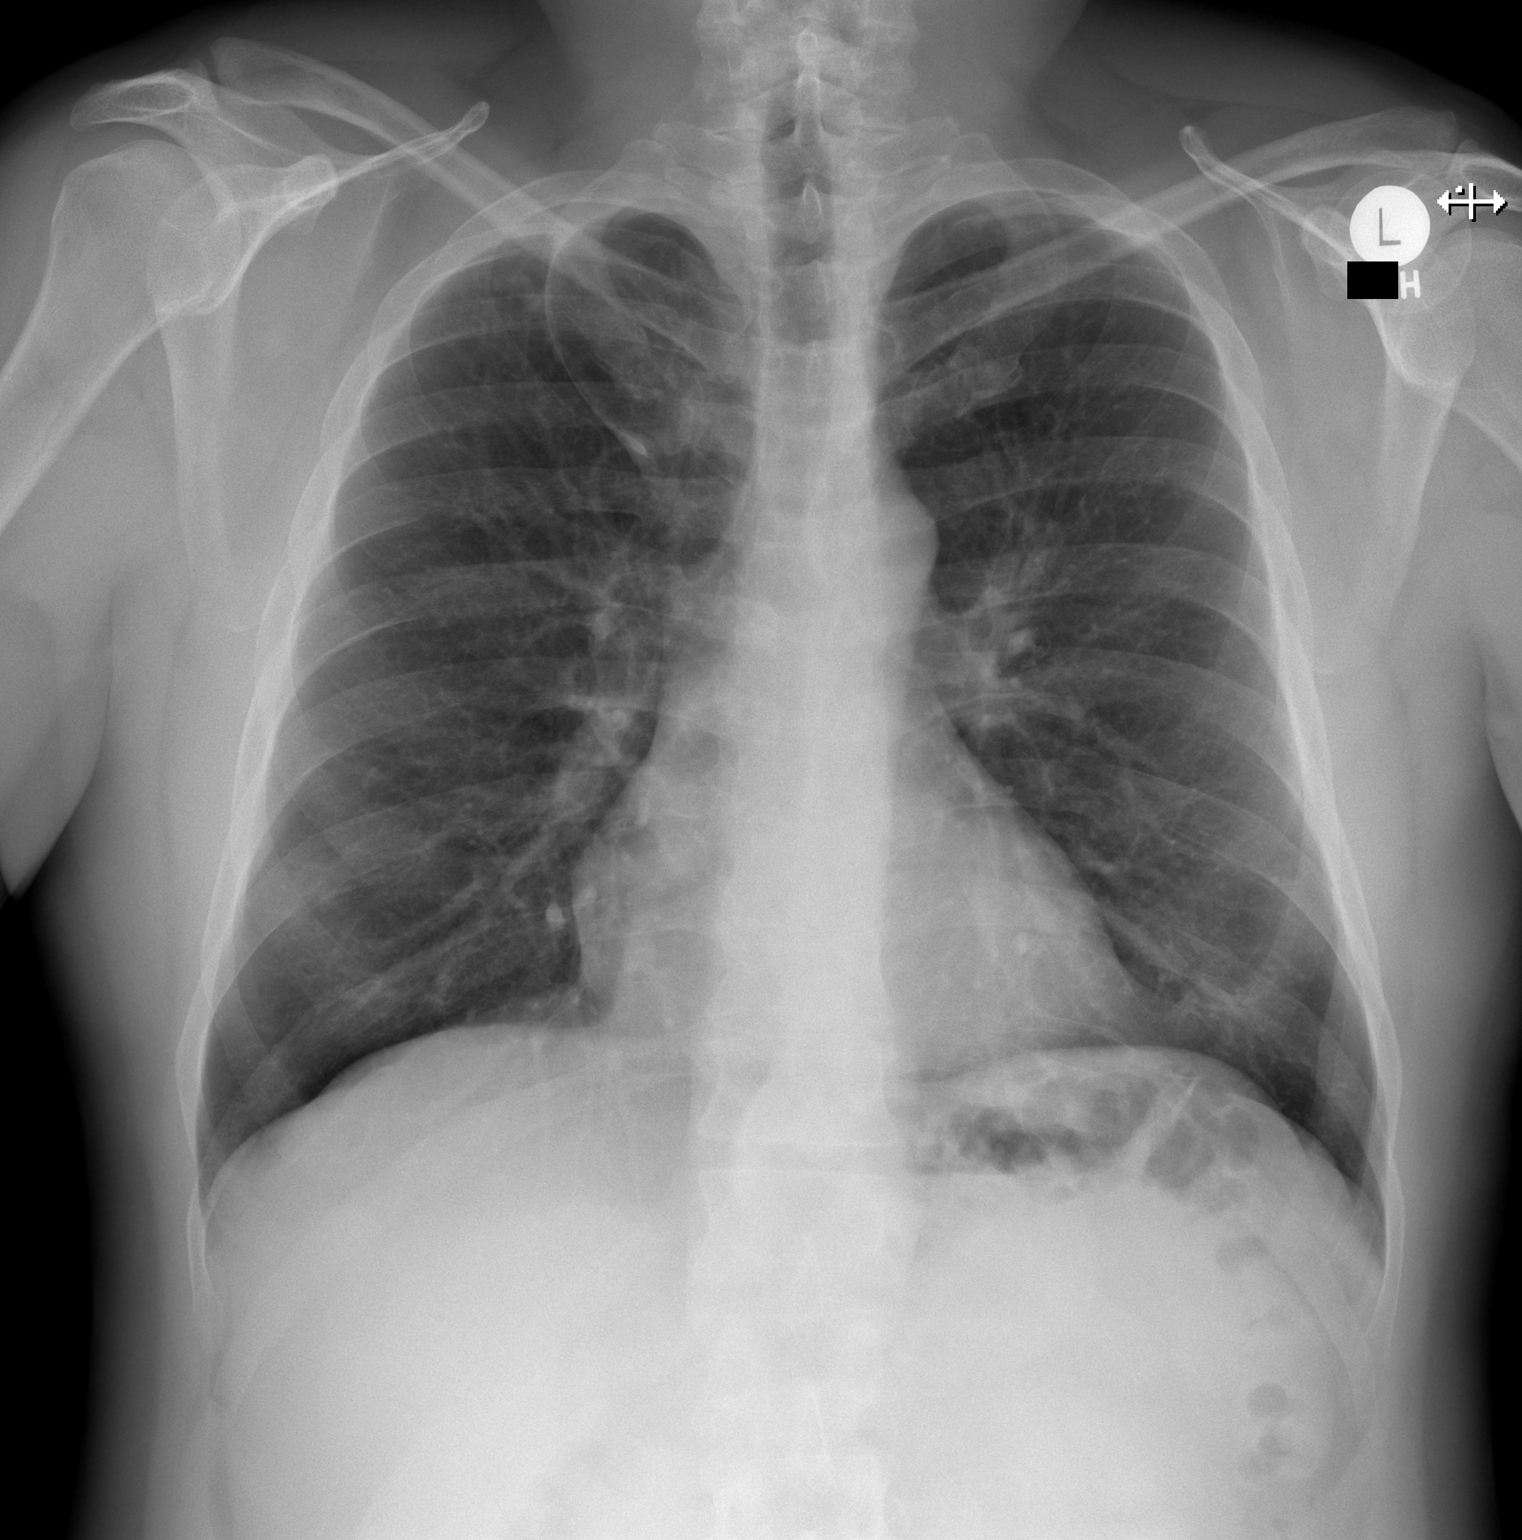

[w chest lat]
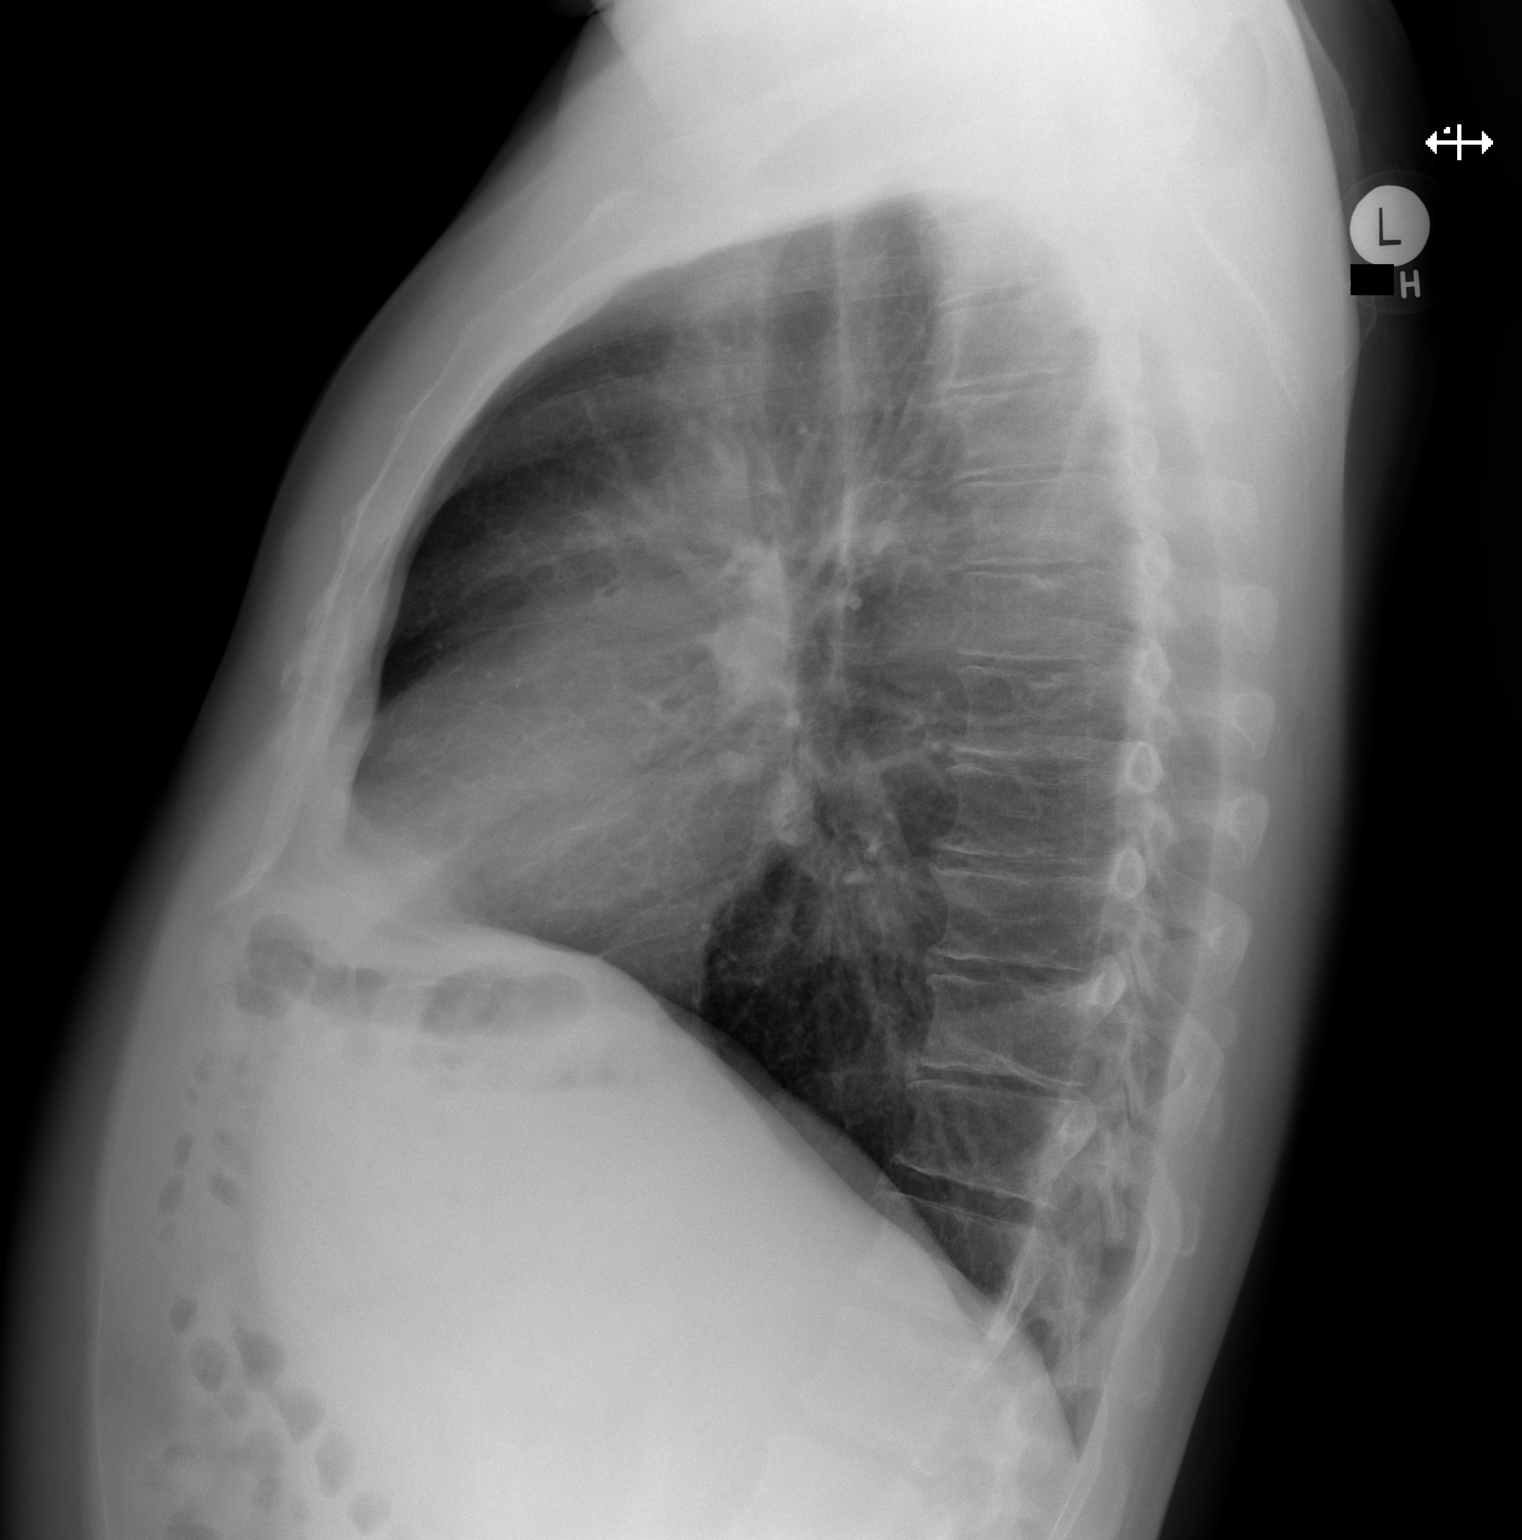

[2 of 2 positions shown; findings below may reference images not displayed]

FINDINGS: Normal heart size. Lungs clear. No pneumothorax. No pleural
effusion.
IMPRESSION: No active cardiopulmonary disease.

## 2016-04-22 NOTE — Addendum Note (Signed)
Addended by: Billie Lade on: 04/22/2016 12:35 PM   Modules accepted: Orders

## 2016-04-22 NOTE — Progress Notes (Signed)
Subjective Chief Complaint  Patient presents with  . Chest Pain    tightness. is fasting because he was told this would be a physical would like labs done today. been going on for a little while now. more tired than normal.    Here for chest tightness and pressure.   Has tried to cut back recently on smoking . Wonders if its heart or lung related.   throughout the day seems to be upper chest.   If exhales deeply, gets a loud wheeze.   Been having this for several weeks.  Denies cough, not sick feeling, no fever, no night sweats, no weight loss.  Chest tightness or pressure lasts continuously now.  Feels like a cross between anxiety and gas.  Doesn't think its related to anxiety currently.  But does note his 48 yo daughter has given him a lot of stress this year, and she is graduating high school in 2 weeks. Also not sure the Protonix is working that well.  Still been using personal trainer all year, and getting ready to change to different trainer.  Feels like he is handling the stress ok.  Still smokes, but been smoking about 30 years, on average 1 or less ppd.  No other aggravating or relieving factors. No other complaint.    Past Medical History  Diagnosis Date  . GERD (gastroesophageal reflux disease)   . Tobacco use   . ED (erectile dysfunction)   . Anxiety     prior therapy, in remission  . Wears glasses   . 23-polyvalent pneumococcal polysaccharide vaccine declined 7/14   Family History  Problem Relation Age of Onset  . Heart failure Father   . Heart disease Father 32    stent, CAD  . Sudden death Neg Hx   . Heart attack Neg Hx   . Hyperlipidemia Neg Hx   . Diabetes Neg Hx   . Hypertension Neg Hx   . Cancer Neg Hx   . Stroke Neg Hx   . COPD Mother      ROS as in subjective  Objective: BP 140/90 mmHg  Pulse 74  Temp(Src) 97.6 F (36.4 C) (Tympanic)  Resp 18  Wt 217 lb (98.431 kg)  BP Readings from Last 3 Encounters:  04/22/16 140/90  12/09/15 116/68  11/07/15  130/92   Wt Readings from Last 3 Encounters:  04/22/16 217 lb (98.431 kg)  12/09/15 211 lb (95.709 kg)  11/07/15 211 lb (95.709 kg)   General appearance: alert, no distress, WD/WN,  HEENT: normocephalic, sclerae anicteric, TMs pearly, nares patent, no discharge or erythema, pharynx normal Oral cavity: MMM, no lesions Neck: supple, no lymphadenopathy, no thyromegaly, no masses Heart: RRR, normal S1, S2, no murmurs Lungs: CTA bilaterally, no wheezes, rhonchi, or rales Abdomen: +bs, soft, non tender, non distended, no masses, no hepatomegaly, no splenomegaly Pulses: 2+ symmetric, upper and lower extremities, normal cap refill   Adult ECG Report  Indication: chest pressure  Rate: 69 bpm  Rhythm: normal sinus rhythm  QRS Axis: 42 degrees  PR Interval: 134ms  QRS Duration: 35ms  QTc: 473ms  Conduction Disturbances: none  Other Abnormalities: none  Patient's cardiac risk factors are: male gender and smoking/ tobacco exposure.  EKG comparison: 2014  Narrative Interpretation: normal EKG, no change from 2014 EKG    Assessment:  Encounter Diagnoses  Name Primary?  . Chest tightness Yes  . Smoker   . Cough     Plan: Discussed possible etiologies.  Baseline PFT here showed  mild restriction.  EKG unremarkable.  Will send for CXR.   Consider change in PPI, will likely pursue pulmonology consult.  Consider chest CT for lung cancer screening given 30pack year hx/o smoking.    He agrees with plan.   Come in soon for physical.  Yazid was seen today for chest pain.  Diagnoses and all orders for this visit:  Chest tightness -     Spirometry with Graph -     DG Chest 2 View; Future  Smoker -     Spirometry with Graph -     DG Chest 2 View; Future  Cough -     Spirometry with Graph -     DG Chest 2 View; Future

## 2016-04-23 ENCOUNTER — Telehealth: Payer: Self-pay

## 2016-04-23 DIAGNOSIS — R0789 Other chest pain: Secondary | ICD-10-CM

## 2016-04-23 DIAGNOSIS — R062 Wheezing: Secondary | ICD-10-CM

## 2016-04-23 NOTE — Telephone Encounter (Signed)
Referred to [facility]

## 2016-04-23 NOTE — Telephone Encounter (Signed)
-----   Message from Carlena Hurl, PA-C sent at 04/23/2016  8:12 AM EDT ----- Xray normal.   Refer to pulmonology for consult on chest pressure, wheezing, likely COPD

## 2016-04-30 ENCOUNTER — Ambulatory Visit (INDEPENDENT_AMBULATORY_CARE_PROVIDER_SITE_OTHER): Payer: BLUE CROSS/BLUE SHIELD | Admitting: Internal Medicine

## 2016-04-30 ENCOUNTER — Encounter: Payer: Self-pay | Admitting: Internal Medicine

## 2016-04-30 VITALS — BP 150/90 | HR 78 | Ht 71.0 in | Wt 220.0 lb

## 2016-04-30 DIAGNOSIS — Z720 Tobacco use: Secondary | ICD-10-CM | POA: Diagnosis not present

## 2016-04-30 DIAGNOSIS — R058 Other specified cough: Secondary | ICD-10-CM | POA: Insufficient documentation

## 2016-04-30 DIAGNOSIS — R05 Cough: Secondary | ICD-10-CM | POA: Insufficient documentation

## 2016-04-30 DIAGNOSIS — F17210 Nicotine dependence, cigarettes, uncomplicated: Secondary | ICD-10-CM

## 2016-04-30 MED ORDER — FAMOTIDINE 20 MG PO TABS: ORAL_TABLET | ORAL | Status: DC

## 2016-04-30 NOTE — Assessment & Plan Note (Addendum)
Spirometry 04/22/16 no sign airflow obst, very minimal  bowing in f/v loop  - rec max gerd rx 04/30/2016 >>>    Upper airway cough syndrome, so named because it's frequently impossible to sort out how much is  CR/sinusitis with freq throat clearing (which can be related to primary GERD)   vs  causing  secondary (" extra esophageal")  GERD from wide swings in gastric pressure that occur with throat clearing, often  promoting self use of mint and menthol lozenges that reduce the lower esophageal sphincter tone and exacerbate the problem further in a cyclical fashion.  This can cause a "reflex to reflux" with chest tightness in absence of pft abnormality or true wheeze as it is mostly an upper airway response.  These are the same pts (now being labeled as having "irritable larynx syndrome" by some cough centers) who not infrequently have a history of having failed to tolerate ace inhibitors,  dry powder inhalers or biphosphonates or report having atypical reflux symptoms that don't respond to standard doses of PPI , and are easily confused as having aecopd or asthma flares by even experienced allergists/ pulmonologists.   rec trial of max gerd rx first then consider CT sinus/chest next   Total time devoted to counseling  = 35/9m review case with pt/ discussion of symptoms including distinction from chest tightness from bronchospasm or IHD and re gerd then  options/alternatives/ personally creating written instructions  in presence of pt  then going over those specific  Instructions directly with the pt including how to use all of the meds but in particular covering each new medication in detail and the difference between the maintenance/automatic meds and the prns using an action plan format for the latter.

## 2016-04-30 NOTE — Assessment & Plan Note (Signed)

## 2016-04-30 NOTE — Progress Notes (Signed)
Subjective:     Patient ID: Cody Tapia, male   DOB: Dec 19, 1965       MRN: [id number]  HPI   73 yowm active smoker with variable chest tightness x at least since summer 2016 but more consistent x mid May 2017 made worse by smoking and eval by [facility] with neg EKG/cxr/ spirometry and referred to pulmonary 04/30/2016    04/30/2016 1st [facility] office visit/ Alyan Hartline   Chief Complaint  Patient presents with  . Pulmonary Consult    Referred by Chana Bode, PA. Pt c/o chest tightness for at least the past month.    since onset of worse chest tightness has been able to consistently able to work out including aerobics with mild  sob but no tightness while on ppi taking daily with protein shake.  Tightness can last all day long once it starts but usually take a cigarette to trigger it.   Throat clearing is variable, no cough on wakening/ does not disturb sleep nor does the tightness  No obvious day to day or daytime variability or assoc excess/ purulent sputum or mucus plugs or hemoptysis or cp or  subjective wheeze or overt sinus or hb symptoms. No unusual exp hx or h/o childhood pna/ asthma or knowledge of premature birth.  Sleeping ok without nocturnal  or early am exacerbation  of respiratory  c/o's or need for noct saba. Also denies any obvious fluctuation of symptoms with weather or environmental changes or other aggravating or alleviating factors except as outlined above   Current Medications, Allergies, Complete Past Medical History, Past Surgical History, Family History, and Social History were reviewed in [facility] record.  ROS  The following are not active complaints unless bolded sore throat, dysphagia, dental problems, itching, sneezing,  nasal congestion or excess/ purulent secretions, ear ache,   fever, chills, sweats, unintended wt loss, classically pleuritic or exertional cp,  orthopnea pnd or leg swelling, presyncope, palpitations, abdominal pain,  anorexia, nausea, vomiting, diarrhea  or change in bowel or bladder habits, change in stools or urine, dysuria,hematuria,  rash, arthralgias, visual complaints, headache, numbness, weakness or ataxia or problems with walking or coordination,  change in mood/affect or memory.              Review of Systems     Objective:   Physical Exam    amb tense wm  wm nad with freq vigorous  throat clearing   . Wt Readings from Last 3 Encounters:  04/30/16 220 lb (99.791 kg)  04/22/16 217 lb (98.431 kg)  12/09/15 211 lb (95.709 kg)    Vital signs reviewed   HEENT: nl dentition, t  oropharynx (s excess pnds/cobblestoning) . Nl external ear canals without cough reflex - mild /moderate bilateral non-specific turbinate edema     NECK :  without JVD/Nodes/TM/ nl carotid upstrokes bilaterally   LUNGS: no acc muscle use,  Nl contour chest which is clear to A and P bilaterally without cough on insp or exp maneuvers   CV:  RRR  no s3 or murmur or increase in P2, no edema   ABD:  soft and nontender with nl inspiratory excursion in the supine position. No bruits or organomegaly, bowel sounds nl  MS:  Nl gait/ ext warm without deformities, calf tenderness, cyanosis or clubbing No obvious joint restrictions   SKIN: warm and dry without lesions    NEURO:  alert, approp, nl sensorium with  no motor deficits     I  personally reviewed images and agree with radiology impression as follows:  CXR:  04/22/16 No active cardiopulmonary disease.          Assessment:

## 2016-04-30 NOTE — Patient Instructions (Addendum)
Pantoprazole (protonix) 40 mg   Take  30-60 min before first meal of the day and Pepcid (famotidine)  20 mg one @  bedtime  X one month and continue if better, if not call me  GERD (REFLUX)  is an extremely common cause of respiratory symptoms just like yours , many times with no obvious heartburn at all.    It can be treated with medication, but also with lifestyle changes including elevation of the head of your bed (ideally with 6 inch  bed blocks),  Smoking cessation, avoidance of late meals, excessive alcohol, and avoid fatty foods, chocolate, peppermint, colas, red wine, and acidic juices such as orange juice.  NO MINT OR MENTHOL PRODUCTS SO NO COUGH DROPS  USE SUGARLESS CANDY INSTEAD (Jolley ranchers or Stover's or Life Savers) or even ice chips will also do - the key is to swallow to prevent all throat clearing. NO OIL BASED VITAMINS - use powdered substitutes.  The key is to stop smoking completely before smoking completely stops you - it's really not too late!    If not better in two weeks call for CT chest and sinus

## 2016-06-22 ENCOUNTER — Telehealth: Payer: Self-pay

## 2016-06-22 ENCOUNTER — Encounter: Payer: Self-pay | Admitting: Family Medicine

## 2016-06-22 ENCOUNTER — Ambulatory Visit (INDEPENDENT_AMBULATORY_CARE_PROVIDER_SITE_OTHER): Payer: BLUE CROSS/BLUE SHIELD | Admitting: Family Medicine

## 2016-06-22 VITALS — BP 122/82 | HR 75 | Wt 218.8 lb

## 2016-06-22 DIAGNOSIS — R0789 Other chest pain: Secondary | ICD-10-CM | POA: Diagnosis not present

## 2016-06-22 NOTE — Telephone Encounter (Signed)
Pt states that he is still having chest tightness, and has seen pulmonologist. Pt states he called the pulmonologist, but they can not see him until end of August. Pt questions if he needs to come back in and see you or if we can refer back to pulmonologist. He can be reached at [phone].

## 2016-06-22 NOTE — Patient Instructions (Signed)
Call [phone] quit now. Make a list of things that you will do instead of smoking and carry with you. Do your like yourself and  like living

## 2016-06-22 NOTE — Progress Notes (Signed)
   Subjective:    Patient ID: Cody Tapia, male    DOB: [birth year], 50 y.o.   MRN: [id number]  HPI He is here for further discussion of the chest tightness. He was seen by Dr. Melvyn Novas. That record was reviewed. He did try Protonix and Pepcid and did get some relief of his symptoms. He does however note that the tightness does get worse when he smokes. He usually last for several hours and then will go away. Physical activity has no effect on it. He has tried quitting smoking with Chantix, patches and gum and has not so far been successful. He is concerned that further testing specifically chest CT needs to be done.   Review of Systems     Objective:   Physical Exam Alert and in no distress otherwise not examined       Assessment & Plan:  Chest tightness I reviewed the evaluation that he has had so far in regard to his lung function and possible cardiac risk. Explained that his lung function was normal and we don't really have a good reason behind his tightness other than the fact that obviously smoking does play a role in this. Discussed smoking cessation with him. He is now smoking less than a half a pack per day. Explained that the true addiction is probably not a factor and gave him instructions on how to handle the triggers. He is to keep a list of when he smokes and also a list of things to do instead of smoking. Encouraged him to return here on an as-needed basis for follow-up on this. Also discussed the fact that when he is successful, he needs to avoid having just one cigarette and is having difficulty with stress, let us know cannot fall back on old behavior.

## 2016-07-08 DIAGNOSIS — H53021 Refractive amblyopia, right eye: Secondary | ICD-10-CM | POA: Diagnosis not present

## 2016-07-08 DIAGNOSIS — H52221 Regular astigmatism, right eye: Secondary | ICD-10-CM | POA: Diagnosis not present

## 2016-07-08 DIAGNOSIS — H52222 Regular astigmatism, left eye: Secondary | ICD-10-CM | POA: Diagnosis not present

## 2016-07-08 DIAGNOSIS — H5212 Myopia, left eye: Secondary | ICD-10-CM | POA: Diagnosis not present

## 2016-07-08 DIAGNOSIS — H5201 Hypermetropia, right eye: Secondary | ICD-10-CM | POA: Diagnosis not present

## 2016-07-10 ENCOUNTER — Ambulatory Visit: Payer: BLUE CROSS/BLUE SHIELD | Admitting: Internal Medicine

## 2016-07-13 ENCOUNTER — Ambulatory Visit: Payer: BLUE CROSS/BLUE SHIELD | Admitting: Internal Medicine

## 2016-07-20 DIAGNOSIS — H53021 Refractive amblyopia, right eye: Secondary | ICD-10-CM | POA: Diagnosis not present

## 2016-07-20 DIAGNOSIS — H00021 Hordeolum internum right upper eyelid: Secondary | ICD-10-CM | POA: Diagnosis not present

## 2016-07-22 ENCOUNTER — Ambulatory Visit: Payer: BLUE CROSS/BLUE SHIELD | Admitting: Internal Medicine

## 2016-08-02 DIAGNOSIS — H6690 Otitis media, unspecified, unspecified ear: Secondary | ICD-10-CM | POA: Diagnosis not present

## 2016-08-07 DIAGNOSIS — H6690 Otitis media, unspecified, unspecified ear: Secondary | ICD-10-CM | POA: Diagnosis not present

## 2016-08-07 DIAGNOSIS — J0141 Acute recurrent pansinusitis: Secondary | ICD-10-CM | POA: Diagnosis not present

## 2016-08-10 DIAGNOSIS — L720 Epidermal cyst: Secondary | ICD-10-CM | POA: Diagnosis not present

## 2016-08-10 DIAGNOSIS — H0019 Chalazion unspecified eye, unspecified eyelid: Secondary | ICD-10-CM | POA: Diagnosis not present

## 2016-08-12 ENCOUNTER — Ambulatory Visit: Payer: BLUE CROSS/BLUE SHIELD | Admitting: Internal Medicine

## 2016-09-10 DIAGNOSIS — H2513 Age-related nuclear cataract, bilateral: Secondary | ICD-10-CM | POA: Diagnosis not present

## 2016-09-10 DIAGNOSIS — H0011 Chalazion right upper eyelid: Secondary | ICD-10-CM | POA: Diagnosis not present

## 2016-09-14 ENCOUNTER — Other Ambulatory Visit: Payer: Self-pay | Admitting: Medical

## 2016-09-21 ENCOUNTER — Ambulatory Visit (INDEPENDENT_AMBULATORY_CARE_PROVIDER_SITE_OTHER): Payer: BLUE CROSS/BLUE SHIELD | Admitting: Medical

## 2016-09-21 ENCOUNTER — Encounter: Payer: Self-pay | Admitting: Medical

## 2016-09-21 VITALS — BP 142/98 | HR 76 | Temp 98.3°F | Wt 216.8 lb

## 2016-09-21 DIAGNOSIS — Z6830 Body mass index (BMI) 30.0-30.9, adult: Secondary | ICD-10-CM

## 2016-09-21 DIAGNOSIS — R030 Elevated blood-pressure reading, without diagnosis of hypertension: Secondary | ICD-10-CM | POA: Diagnosis not present

## 2016-09-21 DIAGNOSIS — K219 Gastro-esophageal reflux disease without esophagitis: Secondary | ICD-10-CM

## 2016-09-21 DIAGNOSIS — E669 Obesity, unspecified: Secondary | ICD-10-CM | POA: Diagnosis not present

## 2016-09-21 DIAGNOSIS — F17210 Nicotine dependence, cigarettes, uncomplicated: Secondary | ICD-10-CM

## 2016-09-21 DIAGNOSIS — K209 Esophagitis, unspecified without bleeding: Secondary | ICD-10-CM

## 2016-09-21 DIAGNOSIS — R070 Pain in throat: Secondary | ICD-10-CM

## 2016-09-21 NOTE — Progress Notes (Signed)
Subjective: Chief Complaint  Patient presents with  . sore throat    sore throat x1 week, no fever,hurts on the sides   3 weeks ago had possible food poisoning.  Had several episodes of nausea, vomiting.   Since then has had ongoing discomfort in throat, particular right side.  No hematemesis, no fever, currently doesn't hurt to swallow or eat.   No swollen glands, no cough, no runy nose, ear pain, congestion or other URI symptoms, no TMJ pain, no teeth pain, no lumps or mass in throat.  He is a smoker, has cut down, but no chewing tobacco.      He notes several weeks ago went to urgent care when he got back from [place], had bad ear infection  He notes seeing Dr. Melvyn Novas months ago after the abnormal PFT here.  Was told he didn't have COPD yet, but smoking was putting him at risk.  Advised his symptoms primarily represented GERD, was put on Protonix and Pepcid.   Past Medical History:  Diagnosis Date  . 23-polyvalent pneumococcal polysaccharide vaccine declined 7/14  . Anxiety    prior therapy, in remission  . ED (erectile dysfunction)   . GERD (gastroesophageal reflux disease)   . Tobacco use   . Wears glasses    Current Outpatient Prescriptions on File Prior to Visit  Medication Sig Dispense Refill  . CIALIS 20 MG tablet TAKE 1 TABLET(20 MG) BY MOUTH DAILY AS NEEDED FOR ERECTILE DYSFUNCTION 8 tablet 0  . famotidine (PEPCID) 20 MG tablet One at bedtime 30 tablet 11  . Multiple Vitamin (MULTIVITAMIN) tablet Take 1 tablet by mouth daily.    . pantoprazole (PROTONIX) 40 MG tablet Take 1 tablet (40 mg total) by mouth daily. 90 tablet 3  . nicotine (NICODERM CQ - DOSED IN MG/24 HOURS) 21 mg/24hr patch Place 1 patch (21 mg total) onto the skin daily. (Patient not taking: Reported on 09/21/2016) 28 patch 1   No current facility-administered medications on file prior to visit.    ROS as in subjective    Objective: BP (!) 142/98   Pulse 76   Temp 98.3 F (36.8 C) (Oral)   Wt 216 lb  12.8 oz (98.3 kg)   SpO2 98%   BMI 30.24 kg/m    Wt Readings from Last 3 Encounters:  09/21/16 216 lb 12.8 oz (98.3 kg)  06/22/16 218 lb 12.8 oz (99.2 kg)  04/30/16 220 lb (99.8 kg)    BP Readings from Last 3 Encounters:  09/21/16 (!) 142/98  06/22/16 122/82  04/30/16 (!) 150/90    General appearance: alert, no distress, WD/WN,  HE ENT: normocephalic, sclerae anicteric, TMs pearly, nares patent, no discharge or erythema, pharynx normal Oral cavity: MMM, no lesions Neck: supple, no lymphadenopathy, no thyromegaly, no masses Heart: RRR, normal S1, S2, no murmurs Lungs: CTA bilaterally, no wheezes, rhonchi, or rales Abdomen: +bs, soft, non tender, non distended, no masses, no hepatomegaly, no splenomegaly Pulses: 2+ symmetric, upper and lower extremities, normal cap refill    Assessment: Encounter Diagnoses  Name Primary?  . Throat discomfort Yes  . Acute esophagitis   . Class 1 obesity with serious comorbidity and body mass index (BMI) of 30.0 to 30.9 in adult, unspecified obesity type   . Gastroesophageal reflux disease without esophagitis   . Cigarette smoker   . Elevated blood-pressure reading without diagnosis of hypertension     Plan: Throat discomfort, esophagitis - likely related to inflammation from recent episode of nausea, vomiting s/p  food poisoning.  Advised he use his Famotidine BID and Protonix BID for 2 weeks, avoid GERD triggers.  If not improving in 2 wk, then recheck  Obesity - c/t routine exercise, cut back on meat and dairy, c/t the other healthy choices he is making with diet and exercise  Advised smoking cessation. He has cut down, encouraged c/t efforts to quit  Elevated BP - if not improved by next visit with lifestyle changes, limiting meat/dairy, may need to start medication  F/u in December for physical

## 2016-10-30 ENCOUNTER — Other Ambulatory Visit: Payer: Self-pay | Admitting: Medical

## 2016-11-17 ENCOUNTER — Other Ambulatory Visit: Payer: Self-pay | Admitting: Medical

## 2016-11-17 DIAGNOSIS — K219 Gastro-esophageal reflux disease without esophagitis: Secondary | ICD-10-CM

## 2016-12-23 ENCOUNTER — Telehealth: Payer: Self-pay | Admitting: Medical

## 2016-12-23 ENCOUNTER — Other Ambulatory Visit: Payer: Self-pay | Admitting: Medical

## 2016-12-23 MED ORDER — TADALAFIL 20 MG PO TABS: ORAL_TABLET | ORAL | 1 refills | Status: DC

## 2016-12-23 NOTE — Telephone Encounter (Signed)
Pt needs refill Cialis to [facility] pills is what insurance will pay for

## 2016-12-23 NOTE — Telephone Encounter (Signed)
Done, schedule CPX and fasting labs.

## 2016-12-28 NOTE — Telephone Encounter (Signed)
Left message for pt to schedule appt for CPE

## 2017-01-11 DIAGNOSIS — B009 Herpesviral infection, unspecified: Secondary | ICD-10-CM | POA: Diagnosis not present

## 2017-01-14 ENCOUNTER — Ambulatory Visit (INDEPENDENT_AMBULATORY_CARE_PROVIDER_SITE_OTHER): Payer: BLUE CROSS/BLUE SHIELD | Admitting: Medical

## 2017-01-14 ENCOUNTER — Encounter: Payer: Self-pay | Admitting: Medical

## 2017-01-14 VITALS — BP 142/80 | HR 81 | Wt 217.8 lb

## 2017-01-14 DIAGNOSIS — K219 Gastro-esophageal reflux disease without esophagitis: Secondary | ICD-10-CM

## 2017-01-14 DIAGNOSIS — Z0000 Encounter for general adult medical examination without abnormal findings: Secondary | ICD-10-CM

## 2017-01-14 DIAGNOSIS — E66811 Obesity, class 1: Secondary | ICD-10-CM

## 2017-01-14 DIAGNOSIS — Z131 Encounter for screening for diabetes mellitus: Secondary | ICD-10-CM | POA: Insufficient documentation

## 2017-01-14 DIAGNOSIS — K635 Polyp of colon: Secondary | ICD-10-CM | POA: Diagnosis not present

## 2017-01-14 DIAGNOSIS — Z125 Encounter for screening for malignant neoplasm of prostate: Secondary | ICD-10-CM | POA: Diagnosis not present

## 2017-01-14 DIAGNOSIS — E669 Obesity, unspecified: Secondary | ICD-10-CM

## 2017-01-14 DIAGNOSIS — Z23 Encounter for immunization: Secondary | ICD-10-CM

## 2017-01-14 DIAGNOSIS — F17210 Nicotine dependence, cigarettes, uncomplicated: Secondary | ICD-10-CM

## 2017-01-14 DIAGNOSIS — Z122 Encounter for screening for malignant neoplasm of respiratory organs: Secondary | ICD-10-CM | POA: Insufficient documentation

## 2017-01-14 LAB — POCT URINALYSIS DIPSTICK
BILIRUBIN UA: NEGATIVE
Glucose, UA: NEGATIVE
Ketones, UA: NEGATIVE
Leukocytes, UA: NEGATIVE
NITRITE UA: NEGATIVE
PH UA: 6
Protein, UA: NEGATIVE
RBC UA: NEGATIVE
UROBILINOGEN UA: NEGATIVE

## 2017-01-14 LAB — COMPREHENSIVE METABOLIC PANEL
ALT: 20 U/L (ref 9–46)
AST: 21 U/L (ref 10–40)
Albumin: 4.4 g/dL (ref 3.6–5.1)
Alkaline Phosphatase: 81 U/L (ref 40–115)
BILIRUBIN TOTAL: 0.5 mg/dL (ref 0.2–1.2)
BUN: 19 mg/dL (ref 7–25)
CALCIUM: 9.4 mg/dL (ref 8.6–10.3)
CHLORIDE: 106 mmol/L (ref 98–110)
CO2: 24 mmol/L (ref 20–31)
Creat: 1.1 mg/dL (ref 0.60–1.35)
GLUCOSE: 103 mg/dL — AB (ref 65–99)
Potassium: 4.5 mmol/L (ref 3.5–5.3)
Sodium: 138 mmol/L (ref 135–146)
Total Protein: 7.4 g/dL (ref 6.1–8.1)

## 2017-01-14 LAB — CBC WITH DIFFERENTIAL/PLATELET
BASOS ABS: 0 {cells}/uL (ref 0–200)
Basophils Relative: 0 %
EOS PCT: 3 %
Eosinophils Absolute: 216 cells/uL (ref 15–500)
HCT: 45.7 % (ref 38.5–50.0)
Hemoglobin: 15.5 g/dL (ref 13.2–17.1)
LYMPHS ABS: 1728 {cells}/uL (ref 850–3900)
LYMPHS PCT: 24 %
MCH: 30.9 pg (ref 27.0–33.0)
MCHC: 33.9 g/dL (ref 32.0–36.0)
MCV: 91.2 fL (ref 80.0–100.0)
MONOS PCT: 11 %
MPV: 9.5 fL (ref 7.5–12.5)
Monocytes Absolute: 792 cells/uL (ref 200–950)
NEUTROS PCT: 62 %
Neutro Abs: 4464 cells/uL (ref 1500–7800)
PLATELETS: 283 10*3/uL (ref 140–400)
RBC: 5.01 MIL/uL (ref 4.20–5.80)
RDW: 14 % (ref 11.0–15.0)
WBC: 7.2 10*3/uL (ref 4.0–10.5)

## 2017-01-14 LAB — LIPID PANEL
CHOL/HDL RATIO: 4.3 ratio (ref <5.0)
Cholesterol: 218 mg/dL — ABNORMAL HIGH (ref <200)
HDL: 51 mg/dL (ref >40)
LDL CALC: 153 mg/dL — AB (ref <100)
Triglycerides: 70 mg/dL (ref <150)
VLDL: 14 mg/dL (ref <30)

## 2017-01-14 LAB — TSH: TSH: 1.99 m[IU]/L (ref 0.40–4.50)

## 2017-01-14 LAB — PSA: PSA: 0.2 ng/mL (ref <=4.0)

## 2017-01-14 MED ORDER — PHENTERMINE HCL 37.5 MG PO TABS: 37.5000 mg | ORAL_TABLET | Freq: Every day | ORAL | 1 refills | Status: DC

## 2017-01-14 NOTE — Patient Instructions (Signed)
GERD  Begin Dexilant once daily as a trial instead of Protonix.  Continue night time Pepcid.   If this doesn't make a big improvement, we should consider recheck with gastropathology  Obesity  Begin back on trial of Phentermine once daily or once every other day as an appetite Supressant  Call insurance about coverage for Qsymia, Minette Headland, and Saxenda  Consider dietician consult.  We can refer for this  Consider using fit bit to track your steps daily  Set short term goals and work towards them  Continue working with your trainer  See your eye doctor yearly for routine vision care.  See your dentist yearly for routine dental care including hygiene visits twice yearly.  We will call with lab results

## 2017-01-14 NOTE — Progress Notes (Signed)
Subjective:   HPI  Cody Tapia is a 51 y.o. male who presents for a complete physical.  Medical care team includes:  Dr. Melvyn Novas, pulmonology  Delshon Blanchfield Audelia Acton, PA-C here for primary care  Dentist, eye doctor  Sees dermatology   Concerns: Declines flu shot, declines pneumococcal vaccine.  Last colonoscopy 2007, had polyps, non tubular adenoma.  Was told repeat age 6yo.    Still smoking.    Snores some, no witnessed apnea.  Not always wakening rested.  No daytime somnolence.   Still c/o problems not being able to lose weight  Reviewed their medical, surgical, family, social, medication, and allergy history and updated chart as appropriate.  Past Medical History:  Diagnosis Date  . 23-polyvalent pneumococcal polysaccharide vaccine declined 7/14  . Anxiety    prior therapy, in remission  . ED (erectile dysfunction)   . GERD (gastroesophageal reflux disease)   . Tobacco use   . Wears glasses     Past Surgical History:  Procedure Laterality Date  . COLONOSCOPY  2009   [facility] for abdominal pain  . ESOPHAGOGASTRODUODENOSCOPY  2009   [facility], abdominal pain  . EYE SURGERY    . WISDOM TOOTH EXTRACTION      Social History   Social History  . Marital status: Single    Spouse name: N/A  . Number of children: N/A  . Years of education: N/A   Occupational History  . Not on file.   Social History Main Topics  . Smoking status: Current Some Day Smoker    Packs/day: 0.50    Years: 30.00    Types: Cigarettes  . Smokeless tobacco: Never Used  . Alcohol use 1.8 oz/week    3 Glasses of wine per week  . Drug use: No  . Sexual activity: Not on file   Other Topics Concern  . Not on file   Social History Narrative   Girlfriend, domestic partner, teenage daughter.   Exercise - cardio, elliptical, power walking, free weights 3 days per week.  Working out with Physiological scientist.  Mortgage lending x 20 years.      Family History  Problem  Relation Age of Onset  . Heart failure Father   . Heart disease Father 44    stent, CAD  . Sudden death Neg Hx   . Heart attack Neg Hx   . Hyperlipidemia Neg Hx   . Diabetes Neg Hx   . Hypertension Neg Hx   . Cancer Neg Hx   . Stroke Neg Hx   . COPD Mother      Current Outpatient Prescriptions:  .  famotidine (PEPCID) 20 MG tablet, One at bedtime, Disp: 30 tablet, Rfl: 11 .  Multiple Vitamin (MULTIVITAMIN) tablet, Take 1 tablet by mouth daily., Disp: , Rfl:  .  pantoprazole (PROTONIX) 40 MG tablet, TAKE 1 TABLET BY MOUTH EVERY DAY, Disp: 90 tablet, Rfl: 0 .  tadalafil (CIALIS) 20 MG tablet, TAKE 1 TABLET(20 MG) BY MOUTH DAILY AS NEEDED FOR ERECTILE DYSFUNCTION, Disp: 8 tablet, Rfl: 1 .  nicotine (NICODERM CQ - DOSED IN MG/24 HOURS) 21 mg/24hr patch, Place 1 patch (21 mg total) onto the skin daily. (Patient not taking: Reported on 09/21/2016), Disp: 28 patch, Rfl: 1 .  phentermine (ADIPEX-P) 37.5 MG tablet, Take 1 tablet (37.5 mg total) by mouth daily before breakfast., Disp: 30 tablet, Rfl: 1  No Known Allergies    Review of Systems Constitutional: -fever, -chills, -sweats, -unexpected weight change, -decreased appetite, -  fatigue Allergy: -sneezing, -itching, -congestion Dermatology: -changing moles, --rash, -lumps ENT: -runny nose, -ear pain, -sore throat, -hoarseness, -sinus pain, -teeth pain, - ringing in ears, -hearing loss, -nosebleeds Cardiology: -chest pain, -palpitations, -swelling, -difficulty breathing when lying flat, -waking up short of breath Respiratory: -cough, -shortness of breath, -difficulty breathing with exercise or exertion, -wheezing, -coughing up blood Gastroenterology: -abdominal pain, -nausea, -vomiting, -diarrhea, -constipation, -blood in stool, -changes in bowel movement, -difficulty swallowing or eating Hematology: -bleeding, -bruising  Musculoskeletal: -joint aches, -muscle aches, -joint swelling, -back pain, -neck pain, -cramping, -changes in  gait Ophthalmology: denies vision changes, eye redness, itching, discharge Urology: -burning with urination, -difficulty urinating, -blood in urine, -urinary frequency, -urgency, -incontinence Neurology: -headache, -weakness, -tingling, -numbness, -memory loss, -falls, -dizziness Psychology: -depressed mood, -agitation, -sleep problems     Objective:   Physical Exam  BP (!) 142/80   Pulse 81   Wt 217 lb 12.8 oz (98.8 kg)   SpO2 97%   BMI 30.38 kg/m   Wt Readings from Last 3 Encounters:  01/14/17 217 lb 12.8 oz (98.8 kg)  09/21/16 216 lb 12.8 oz (98.3 kg)  06/22/16 218 lb 12.8 oz (99.2 kg)   BP Readings from Last 3 Encounters:  01/14/17 (!) 142/80  09/21/16 (!) 142/98  06/22/16 122/82   General appearance: alert, no distress, WD/WN, white male Skin: scattered macules, no worrisome lesions HEENT: normocephalic, conjunctiva/corneas normal, sclerae anicteric, PERRLA, EOMi, nares patent, no discharge or erythema, pharynx normal Oral cavity: MMM, tongue normal, teeth normal Neck: supple, no lymphadenopathy, no thyromegaly, no masses, normal ROM, no bruits Chest: non tender, normal shape and expansion Heart: RRR, normal S1, S2, no murmurs Lungs: CTA bilaterally, no wheezes, rhonchi, or rales Abdomen: +bs, soft, non tender, non distended, no masses, no hepatomegaly, no splenomegaly, no bruits Back: non tender, normal ROM, no scoliosis Musculoskeletal: upper extremities non tender, no obvious deformity, normal ROM throughout, lower extremities non tender, no obvious deformity, normal ROM throughout Extremities: no edema, no cyanosis, no clubbing Pulses: 2+ symmetric, upper and lower extremities, normal cap refill Neurological: alert, oriented x 3, CN2-12 intact, strength normal upper extremities and lower extremities, sensation normal throughout, DTRs 2+ throughout, no cerebellar signs, gait normal Psychiatric: normal affect, behavior normal, pleasant  GU: normal male external  genitalia, circumcised, non tender, no masses, no hernia, no lymphadenopathy Rectal: anus normal tone, prostate WNL, no nodules   Assessment and Plan :      Encounter Diagnoses  Name Primary?  . Routine general medical examination at a health care facility Yes  . Cigarette smoker   . Gastroesophageal reflux disease without esophagitis   . Class 1 obesity with serious comorbidity in adult, unspecified BMI, unspecified obesity type   . Screening for prostate cancer   . Polyp of colon, unspecified part of colon, unspecified type   . Need for pneumococcal vaccination     Physical exam - discussed healthy lifestyle, diet, exercise, preventative care, vaccinations, and addressed their concerns.   Counseled on the pneumococcal vaccine.  Vaccine information sheet given.  Pneumococcal vaccine PPSV23 given after consent obtained. See your eye doctor yearly for routine vision care. See your dentist yearly for routine dental care including hygiene visits twice yearly. Consider updated GI consult  Patient Instructions  GERD  Begin Dexilant once daily as a trial instead of Protonix.  Continue night time Pepcid.   If this doesn't make a big improvement, we should consider recheck with gastropathology  Obesity  Begin back on trial of Phentermine once  daily or once every other day as an appetite Supressant  Call insurance about coverage for Qsymia, Minette Headland, and Saxenda  Consider dietician consult.  We can refer for this  Consider using fit bit to track your steps daily  Set short term goals and work towards them  Continue working with your trainer  See your eye doctor yearly for routine vision care.  See your dentist yearly for routine dental care including hygiene visits twice yearly.  We will call with lab results   Follow-up pending labs  Cody Tapia was seen today for physical.  Diagnoses and all orders for this visit:  Routine general medical examination at a health care  facility -     POCT urinalysis dipstick -     Comprehensive metabolic panel -     Lipid panel -     CBC with Differential/Platelet -     TSH -     PSA -     Hemoglobin A1c  Cigarette smoker  Gastroesophageal reflux disease without esophagitis  Class 1 obesity with serious comorbidity in adult, unspecified BMI, unspecified obesity type -     Hemoglobin A1c  Screening for prostate cancer -     PSA  Polyp of colon, unspecified part of colon, unspecified type  Need for pneumococcal vaccination  Other orders -     phentermine (ADIPEX-P) 37.5 MG tablet; Take 1 tablet (37.5 mg total) by mouth daily before breakfast.

## 2017-01-15 ENCOUNTER — Other Ambulatory Visit: Payer: Self-pay | Admitting: Medical

## 2017-01-15 ENCOUNTER — Encounter: Payer: Self-pay | Admitting: Medical

## 2017-01-15 LAB — HEMOGLOBIN A1C
Hgb A1c MFr Bld: 5.3 % (ref <5.7)
Mean Plasma Glucose: 105 mg/dL

## 2017-01-15 MED ORDER — PRAVASTATIN SODIUM 20 MG PO TABS: 20.0000 mg | ORAL_TABLET | Freq: Every day | ORAL | 0 refills | Status: DC

## 2017-01-15 MED ORDER — ASPIRIN EC 81 MG PO TBEC: 81.0000 mg | DELAYED_RELEASE_TABLET | Freq: Every day | ORAL | 3 refills | Status: DC

## 2017-02-23 ENCOUNTER — Encounter: Payer: Self-pay | Admitting: Family Medicine

## 2017-02-23 ENCOUNTER — Other Ambulatory Visit: Payer: Self-pay

## 2017-02-23 ENCOUNTER — Ambulatory Visit (INDEPENDENT_AMBULATORY_CARE_PROVIDER_SITE_OTHER): Payer: BLUE CROSS/BLUE SHIELD | Admitting: Family Medicine

## 2017-02-23 ENCOUNTER — Other Ambulatory Visit: Payer: Self-pay | Admitting: Medical

## 2017-02-23 VITALS — BP 130/80 | HR 80 | Wt 217.0 lb

## 2017-02-23 DIAGNOSIS — K219 Gastro-esophageal reflux disease without esophagitis: Secondary | ICD-10-CM

## 2017-02-23 DIAGNOSIS — Z8719 Personal history of other diseases of the digestive system: Secondary | ICD-10-CM | POA: Diagnosis not present

## 2017-02-23 MED ORDER — DILTIAZEM GEL 2 %: 1.0000 "application " | Freq: Two times a day (BID) | CUTANEOUS | 0 refills | Status: DC

## 2017-02-23 NOTE — Progress Notes (Signed)
   Subjective:    Patient ID: Cody Tapia, male    DOB: 1966/02/16, 51 y.o.   MRN: [id number]  HPI He is here for consult concerning possibility of hemorrhoids versus fissure. He has a previous history of difficulty with the fissure. Recently he has noted some blood with his bowel movements. His bowel movements have been relatively soft. He is seen no blood within the last several days.   Review of Systems     Objective:   Physical Exam Alert and in no distress. Rectal exam shows no hemorrhoids. There is an area at 6:00 that does appear to be healed.       Assessment & Plan:  History of rectal fissure I explained that at this point it seems that he has already heal the fissure. Discussed fluids, bulk in diet, exercise. Also discussed diltiazem cream for this. If he has difficulty in the future, I will call this in for him. He was comfortable with this.

## 2017-02-26 ENCOUNTER — Telehealth: Payer: Self-pay | Admitting: Medical

## 2017-03-15 ENCOUNTER — Other Ambulatory Visit: Payer: Self-pay

## 2017-03-15 ENCOUNTER — Telehealth: Payer: Self-pay | Admitting: Medical

## 2017-03-15 ENCOUNTER — Other Ambulatory Visit: Payer: Self-pay | Admitting: Medical

## 2017-03-15 DIAGNOSIS — K219 Gastro-esophageal reflux disease without esophagitis: Secondary | ICD-10-CM

## 2017-03-15 MED ORDER — PANTOPRAZOLE SODIUM 40 MG PO TBEC: 40.0000 mg | DELAYED_RELEASE_TABLET | Freq: Every day | ORAL | 3 refills | Status: DC

## 2017-03-15 NOTE — Telephone Encounter (Signed)
Pt called back stating that Pantoprazole should have been sent to [facility] on [facility]. Pt said this is his normal pharmacy and not [facility]

## 2017-03-15 NOTE — Telephone Encounter (Signed)
P.A. [facility]  °

## 2017-03-15 NOTE — Telephone Encounter (Signed)
P.[facility]. [facility] APPROVED TIL 02/27/18, pt informed

## 2017-03-15 NOTE — Telephone Encounter (Signed)
Resent rx to correct pharmacy. 

## 2017-03-15 NOTE — Telephone Encounter (Signed)
rx sent

## 2017-03-15 NOTE — Telephone Encounter (Signed)
Pt would like refills on Pantoprazole, said works best for him and wants to stay on this and would like refills so he doesn't have to call monthly

## 2017-05-13 ENCOUNTER — Other Ambulatory Visit: Payer: Self-pay | Admitting: Medical

## 2017-05-14 NOTE — Telephone Encounter (Signed)
Is this okay to refill? 

## 2017-09-22 ENCOUNTER — Ambulatory Visit (INDEPENDENT_AMBULATORY_CARE_PROVIDER_SITE_OTHER): Payer: BLUE CROSS/BLUE SHIELD | Admitting: Medical

## 2017-09-22 ENCOUNTER — Telehealth: Payer: Self-pay | Admitting: Medical

## 2017-09-22 ENCOUNTER — Encounter: Payer: Self-pay | Admitting: Medical

## 2017-09-22 VITALS — BP 134/82 | HR 77 | Wt 202.8 lb

## 2017-09-22 DIAGNOSIS — R05 Cough: Secondary | ICD-10-CM

## 2017-09-22 DIAGNOSIS — R058 Other specified cough: Secondary | ICD-10-CM

## 2017-09-22 DIAGNOSIS — K219 Gastro-esophageal reflux disease without esophagitis: Secondary | ICD-10-CM | POA: Diagnosis not present

## 2017-09-22 DIAGNOSIS — K602 Anal fissure, unspecified: Secondary | ICD-10-CM

## 2017-09-22 DIAGNOSIS — N529 Male erectile dysfunction, unspecified: Secondary | ICD-10-CM | POA: Diagnosis not present

## 2017-09-22 DIAGNOSIS — K6289 Other specified diseases of anus and rectum: Secondary | ICD-10-CM | POA: Insufficient documentation

## 2017-09-22 DIAGNOSIS — K635 Polyp of colon: Secondary | ICD-10-CM | POA: Diagnosis not present

## 2017-09-22 MED ORDER — PANTOPRAZOLE SODIUM 40 MG PO TBEC: 40.0000 mg | DELAYED_RELEASE_TABLET | Freq: Every day | ORAL | 0 refills | Status: DC

## 2017-09-22 MED ORDER — FAMOTIDINE 20 MG PO TABS: ORAL_TABLET | ORAL | 2 refills | Status: DC

## 2017-09-22 MED ORDER — DILTIAZEM GEL 2 %: 1.0000 "application " | Freq: Two times a day (BID) | CUTANEOUS | 1 refills | Status: DC

## 2017-09-22 MED ORDER — TADALAFIL 20 MG PO TABS: 20.0000 mg | ORAL_TABLET | Freq: Every day | ORAL | 5 refills | Status: DC

## 2017-09-22 NOTE — Telephone Encounter (Signed)
Refer to GI such as [facility] or [facility] for chronic rectal fissures, hx/o colon polyp  Call out Diltiazem topical gel

## 2017-09-22 NOTE — Progress Notes (Signed)
Subjective: Chief Complaint  Patient presents with  . med check    med check, no other concerns    Here for a few concerns.  Last visit saw Dr. Redmond School in April, has rectal fissure.  Was given diltiazem gel.   Happened a few weeks ago again.   He forgot the gel he had at home.   Ended up using the gel a few days later.  Using stool softener.    Has mild amount of bright red blood on toilet paper a few times.   If he has harder BM, will get fissures.    Has hx/o polyps of colon, last colonoscopy 2007.  He never started statin after last visit with me.  Needs refills on few medications  He has been working hard this year to lose weight, to eat healthy.  He had surgery for anal fissure in thi 20s.   Past Medical History:  Diagnosis Date  . 23-polyvalent pneumococcal polysaccharide vaccine declined 7/14  . Anxiety    prior therapy, in remission  . ED (erectile dysfunction)   . GERD (gastroesophageal reflux disease)   . Tobacco use   . Wears glasses    Current Outpatient Prescriptions on File Prior to Visit  Medication Sig Dispense Refill  . Multiple Vitamin (MULTIVITAMIN) tablet Take 1 tablet by mouth daily.     No current facility-administered medications on file prior to visit.    Past Surgical History:  Procedure Laterality Date  . COLONOSCOPY  2009   [facility] for abdominal pain  . ESOPHAGOGASTRODUODENOSCOPY  2009   [facility], abdominal pain  . EYE SURGERY    . WISDOM TOOTH EXTRACTION     Family History  Problem Relation Age of Onset  . Heart failure Father   . Heart disease Father 62       stent, CAD  . COPD Mother   . Sudden death Neg Hx   . Heart attack Neg Hx   . Hyperlipidemia Neg Hx   . Diabetes Neg Hx   . Hypertension Neg Hx   . Cancer Neg Hx   . Stroke Neg Hx      ROS as in subjective   Objective: BP 134/82   Pulse 77   Wt 202 lb 12.8 oz (92 kg)   SpO2 98%   BMI 28.28 kg/m   Wt Readings from Last 3 Encounters:  09/22/17 202  lb 12.8 oz (92 kg)  02/23/17 217 lb (98.4 kg)  01/14/17 217 lb 12.8 oz (98.8 kg)   Gen: wd, wn, nad Heart RRR, normal s1, s2, no murmurs Lungs clear Ext: no edema   Assessment: Encounter Diagnoses  Name Primary?  . Rectal fissure Yes  . Rectal pain   . Gastroesophageal reflux disease without esophagitis   . Polyp of colon, unspecified part of colon, unspecified type   . Upper airway cough syndrome   . Erectile dysfunction, unspecified erectile dysfunction type     Plan: Refer to GI for chronic fissure and rectal pain.  In the meantime use trial of Amitiza to help with bowels and consistency.   Avoid constipation.  Refills given for GERD, ED  Congratulated him on his weight loss efforts  Cody Tapia was seen today for med check.  Diagnoses and all orders for this visit:  Rectal fissure -     Ambulatory referral to Gastroenterology  Rectal pain -     Ambulatory referral to Gastroenterology  Gastroesophageal reflux disease without esophagitis -  Ambulatory referral to Gastroenterology -     pantoprazole (PROTONIX) 40 MG tablet; Take 1 tablet (40 mg total) by mouth daily.  Polyp of colon, unspecified part of colon, unspecified type -     Ambulatory referral to Gastroenterology  Upper airway cough syndrome -     Ambulatory referral to Gastroenterology -     famotidine (PEPCID) 20 MG tablet; One at bedtime  Erectile dysfunction, unspecified erectile dysfunction type -     Ambulatory referral to Gastroenterology  Other orders -     tadalafil (CIALIS) 20 MG tablet; Take 1 tablet (20 mg total) by mouth daily. as directed -     diltiazem 2 % GEL; Apply 1 application topically 2 (two) times daily.

## 2017-09-24 ENCOUNTER — Other Ambulatory Visit: Payer: Self-pay

## 2017-09-24 ENCOUNTER — Encounter: Payer: Self-pay | Admitting: Gastroenterology

## 2017-09-24 DIAGNOSIS — K602 Anal fissure, unspecified: Secondary | ICD-10-CM

## 2017-09-24 MED ORDER — DILTIAZEM GEL 2 %: 1.0000 "application " | Freq: Two times a day (BID) | CUTANEOUS | 1 refills | Status: DC

## 2017-09-24 NOTE — Telephone Encounter (Signed)
Sent referral to gi and sent refill to pharmacy

## 2017-09-27 ENCOUNTER — Telehealth: Payer: Self-pay | Admitting: Internal Medicine

## 2017-09-27 NOTE — Telephone Encounter (Signed)
Pt called and states that he would like a direct call from you regarding his visit on Friday. He would not tell me what it was about but only requested to talk to you and not a nurse

## 2017-09-28 ENCOUNTER — Telehealth: Payer: Self-pay | Admitting: Medical

## 2017-09-28 ENCOUNTER — Other Ambulatory Visit: Payer: Self-pay | Admitting: Medical

## 2017-09-28 MED ORDER — HYDROCORTISONE ACETATE 25 MG RE SUPP
25.0000 mg | Freq: Two times a day (BID) | RECTAL | 0 refills | Status: DC
Start: 2017-09-28 — End: 2018-04-28

## 2017-09-28 NOTE — Telephone Encounter (Signed)
Cody Tapia [facility] called him and can't get him into late December  Can we try Dr. Benson Norway or different GI for sooner appt?   FYI I spoke to patient.  advise he use the diltiazem gel, begin Anusol suppository.

## 2017-09-28 NOTE — Telephone Encounter (Signed)
Pt called and would like a call from you states he called yesterday and did not get a call and would still like to talk you regarding his Friday visit, he would not tell me what it was about [phone] is the best number to reach him at

## 2017-09-28 NOTE — Telephone Encounter (Signed)
Faxed referral to dr.hung office , called and notified the pt the patient.

## 2017-10-01 ENCOUNTER — Telehealth: Payer: Self-pay | Admitting: Medical

## 2017-10-01 NOTE — Telephone Encounter (Signed)
Recv'd fax from [place] that Anucort [facility] suppository is plan exclusion.  Called pharmacy & they tried several different medications and none would go thru due to plan exclusion.  I called [facility] they can compound [facility] 20mg  suppository #30 for $44.00.  Ok per [facility].  Called pt & he is agreeable.  Called into pharmacy

## 2017-10-04 DIAGNOSIS — K6289 Other specified diseases of anus and rectum: Secondary | ICD-10-CM | POA: Diagnosis not present

## 2017-10-04 DIAGNOSIS — K602 Anal fissure, unspecified: Secondary | ICD-10-CM | POA: Diagnosis not present

## 2017-10-04 DIAGNOSIS — K625 Hemorrhage of anus and rectum: Secondary | ICD-10-CM | POA: Diagnosis not present

## 2017-10-06 ENCOUNTER — Ambulatory Visit: Payer: BLUE CROSS/BLUE SHIELD | Admitting: Physician Assistant

## 2017-10-21 ENCOUNTER — Encounter: Payer: Self-pay | Admitting: Medical

## 2017-10-25 ENCOUNTER — Telehealth: Payer: Self-pay | Admitting: Family Medicine

## 2017-10-25 ENCOUNTER — Other Ambulatory Visit: Payer: Self-pay | Admitting: Medical

## 2017-10-25 MED ORDER — LUBIPROSTONE 24 MCG PO CAPS
24.0000 ug | ORAL_CAPSULE | Freq: Two times a day (BID) | ORAL | 2 refills | Status: DC
Start: 2017-10-25 — End: 2018-03-07

## 2017-10-25 NOTE — Telephone Encounter (Signed)
Pt states you gave him sample of med for stool softener and would like rx called into [facility].

## 2017-10-25 NOTE — Telephone Encounter (Signed)
I sent trial of Amitiza prescription

## 2017-11-08 DIAGNOSIS — K602 Anal fissure, unspecified: Secondary | ICD-10-CM | POA: Diagnosis not present

## 2017-11-10 ENCOUNTER — Ambulatory Visit: Payer: BLUE CROSS/BLUE SHIELD | Admitting: Gastroenterology

## 2018-01-11 ENCOUNTER — Telehealth: Payer: Self-pay | Admitting: Internal Medicine

## 2018-01-11 DIAGNOSIS — K219 Gastro-esophageal reflux disease without esophagitis: Secondary | ICD-10-CM

## 2018-01-11 MED ORDER — PANTOPRAZOLE SODIUM 40 MG PO TBEC: 40.0000 mg | DELAYED_RELEASE_TABLET | Freq: Every day | ORAL | 0 refills | Status: DC

## 2018-01-11 NOTE — Telephone Encounter (Signed)
Pt needs a refill on protonix sent to [facility]

## 2018-02-15 ENCOUNTER — Ambulatory Visit: Payer: BLUE CROSS/BLUE SHIELD | Admitting: Medical

## 2018-02-22 ENCOUNTER — Ambulatory Visit: Payer: BLUE CROSS/BLUE SHIELD | Admitting: Medical

## 2018-02-22 ENCOUNTER — Encounter: Payer: Self-pay | Admitting: Medical

## 2018-02-22 VITALS — BP 128/84 | HR 76 | Ht 71.0 in | Wt 207.8 lb

## 2018-02-22 DIAGNOSIS — K219 Gastro-esophageal reflux disease without esophagitis: Secondary | ICD-10-CM | POA: Diagnosis not present

## 2018-02-22 DIAGNOSIS — R05 Cough: Secondary | ICD-10-CM | POA: Diagnosis not present

## 2018-02-22 DIAGNOSIS — R0789 Other chest pain: Secondary | ICD-10-CM | POA: Insufficient documentation

## 2018-02-22 DIAGNOSIS — R942 Abnormal results of pulmonary function studies: Secondary | ICD-10-CM | POA: Diagnosis not present

## 2018-02-22 DIAGNOSIS — R058 Other specified cough: Secondary | ICD-10-CM

## 2018-02-22 MED ORDER — LEVOCETIRIZINE DIHYDROCHLORIDE 5 MG PO TABS: 5.0000 mg | ORAL_TABLET | Freq: Every evening | ORAL | 0 refills | Status: DC

## 2018-02-22 MED ORDER — FLUTICASONE FUROATE-VILANTEROL 200-25 MCG/INH IN AEPB: 1.0000 | INHALATION_SPRAY | Freq: Every day | RESPIRATORY_TRACT | 0 refills | Status: DC

## 2018-02-22 NOTE — Progress Notes (Signed)
Subjective: Chief Complaint  Patient presents with  . Acute Visit    heavyness in chest, muscle fatigue, duration x1-2 weeks chest pains   Here today for complaint of chest tightness.  He is not sure if this is his heart or anxiety or a lung issue.  He does have a history of anxiety, wonders if this is just stress related.  He exercises 5-6 days/week, has a trainer is a rock star on his diet right now so he is very active without any chest pain or shortness of breath with his exercise.  He does have reflux from time to time.  We ended up referring him to pulmonology back in 2017 for similar issues.  At that time he was told that his symptoms were more related to acid reflux and allergies.  His current symptoms have been worse in the last 3 weeks during the time in which upon has been more prevalent in the air.  He has had some allergy symptoms including congestion and sneezing and itchy eyes.  He denies shortness of breath or wheezing or cough, just chest tightness.  He exercise trainer thinks some of this could be because he is eating at a calorie deficit currently.  He still smokes but has cut back on tobacco.  He drinks a lot of water.  He is also cut back on alcohol.  At times he gets pains in his torso and on attributes this to his workout and weightlifting regimen.   He is due back on 15 April for a physical. No other aggravating or relieving factors. No other complaint.  Past Medical History:  Diagnosis Date  . 23-polyvalent pneumococcal polysaccharide vaccine declined 7/14  . Anxiety    prior therapy, in remission  . ED (erectile dysfunction)   . GERD (gastroesophageal reflux disease)   . Tobacco use   . Wears glasses    Current Outpatient Medications on File Prior to Visit  Medication Sig Dispense Refill  . famotidine (PEPCID) 20 MG tablet One at bedtime 30 tablet 2  . lubiprostone (AMITIZA) 24 MCG capsule Take 1 capsule (24 mcg total) by mouth 2 (two) times daily with a meal. 60  capsule 2  . Multiple Vitamin (MULTIVITAMIN) tablet Take 1 tablet by mouth daily.    . pantoprazole (PROTONIX) 40 MG tablet Take 1 tablet (40 mg total) by mouth daily. 90 tablet 0  . tadalafil (CIALIS) 20 MG tablet Take 1 tablet (20 mg total) by mouth daily. as directed 10 tablet 5  . diltiazem 2 % GEL Apply 1 application topically 2 (two) times daily. (Patient not taking: Reported on 02/22/2018) 30 g 1  . hydrocortisone (ANUSOL-HC) 25 MG suppository Place 1 suppository (25 mg total) 2 (two) times daily rectally. (Patient not taking: Reported on 02/22/2018) 12 suppository 0   No current facility-administered medications on file prior to visit.    ROS as in subjective   Objective: BP 128/84 (BP Location: Right Arm, Patient Position: Sitting, Cuff Size: Normal)   Pulse 76   Ht 5\' 11"  (1.803 m)   Wt 207 lb 12.8 oz (94.3 kg)   SpO2 97%   BMI 28.98 kg/m      General appearance: alert, no distress, WD/WN,  HEENT: normocephalic, sclerae anicteric, PERRLA, EOMi, nares patent, no discharge or erythema, pharynx normal Oral cavity: MMM, no lesions Neck: supple, no lymphadenopathy, no thyromegaly, no masses Heart: RRR, normal S1, S2, no murmurs Lungs: CTA bilaterally, no wheezes, rhonchi, or rales Abdomen: +bs, soft, non  tender, non distended, no masses, no hepatomegaly, no splenomegaly Back: non tender Musculoskeletal: non tender, no swelling, no obvious deformity Extremities: no edema, no cyanosis, no clubbing Pulses: 2+ symmetric, upper and lower extremities, normal cap refill Neurological: alert, oriented x 3, CN2-12 intact, strength normal upper extremities and lower extremities, sensation normal throughout, DTRs 2+ throughout, no cerebellar signs, gait normal Psychiatric: normal affect, behavior normal, pleasant    Assessment: Encounter Diagnoses  Name Primary?  . Chest tightness Yes  . Abnormal PFT   . Upper airway cough syndrome   . Gastroesophageal reflux disease, esophagitis  presence not specified     Plan: We discussed his symptoms and concerns.  The basic PFT today is very similar to the one back in 2017.  I reviewed his 2017 pulmonology note.  Given his symptoms and concerns he likely has allergic trigger causing some reactive airway symptoms.   Given prior eval including EKG and other testing, and given his exercise without significant symptoms, I am less inclined to consider cardiac source today.   We will plan to do labs and EKG at his physical visit in 2 weeks.   For now, begin trial of Breo inhaler daily for prevention, begin xyzal allergy medication QHS.  discussed proper use of medication, goals of therapy, and symptoms to watch for suggesting improvement.  F/u 2wk, sooner prn.     Esaias was seen today for acute visit.  Diagnoses and all orders for this visit:  Chest tightness  Abnormal PFT  Upper airway cough syndrome -     Spirometry with graph  Gastroesophageal reflux disease, esophagitis presence not specified  Other orders -     levocetirizine (XYZAL) 5 MG tablet; Take 1 tablet (5 mg total) by mouth every evening. -     fluticasone furoate-vilanterol (BREO ELLIPTA) 200-25 MCG/INH AEPB; Inhale 1 puff into the lungs daily.

## 2018-03-03 ENCOUNTER — Other Ambulatory Visit: Payer: Self-pay

## 2018-03-07 ENCOUNTER — Telehealth: Payer: Self-pay | Admitting: Medical

## 2018-03-07 ENCOUNTER — Ambulatory Visit (INDEPENDENT_AMBULATORY_CARE_PROVIDER_SITE_OTHER): Payer: BLUE CROSS/BLUE SHIELD | Admitting: Medical

## 2018-03-07 ENCOUNTER — Encounter: Payer: Self-pay | Admitting: Medical

## 2018-03-07 VITALS — BP 146/90 | HR 77 | Temp 98.0°F | Ht 70.0 in | Wt 208.4 lb

## 2018-03-07 DIAGNOSIS — Z125 Encounter for screening for malignant neoplasm of prostate: Secondary | ICD-10-CM | POA: Diagnosis not present

## 2018-03-07 DIAGNOSIS — N529 Male erectile dysfunction, unspecified: Secondary | ICD-10-CM | POA: Diagnosis not present

## 2018-03-07 DIAGNOSIS — Z7189 Other specified counseling: Secondary | ICD-10-CM | POA: Diagnosis not present

## 2018-03-07 DIAGNOSIS — Z0000 Encounter for general adult medical examination without abnormal findings: Secondary | ICD-10-CM

## 2018-03-07 DIAGNOSIS — F17210 Nicotine dependence, cigarettes, uncomplicated: Secondary | ICD-10-CM

## 2018-03-07 DIAGNOSIS — R030 Elevated blood-pressure reading, without diagnosis of hypertension: Secondary | ICD-10-CM

## 2018-03-07 DIAGNOSIS — J45909 Unspecified asthma, uncomplicated: Secondary | ICD-10-CM | POA: Insufficient documentation

## 2018-03-07 DIAGNOSIS — Z7185 Encounter for immunization safety counseling: Secondary | ICD-10-CM

## 2018-03-07 DIAGNOSIS — R942 Abnormal results of pulmonary function studies: Secondary | ICD-10-CM

## 2018-03-07 DIAGNOSIS — Z122 Encounter for screening for malignant neoplasm of respiratory organs: Secondary | ICD-10-CM | POA: Diagnosis not present

## 2018-03-07 DIAGNOSIS — R0789 Other chest pain: Secondary | ICD-10-CM

## 2018-03-07 DIAGNOSIS — K219 Gastro-esophageal reflux disease without esophagitis: Secondary | ICD-10-CM | POA: Diagnosis not present

## 2018-03-07 DIAGNOSIS — K635 Polyp of colon: Secondary | ICD-10-CM | POA: Diagnosis not present

## 2018-03-07 LAB — POCT URINALYSIS DIP (PROADVANTAGE DEVICE)
BILIRUBIN UA: NEGATIVE
BILIRUBIN UA: NEGATIVE mg/dL
Blood, UA: NEGATIVE
Glucose, UA: NEGATIVE mg/dL
Leukocytes, UA: NEGATIVE
Nitrite, UA: NEGATIVE
PROTEIN UA: NEGATIVE mg/dL
SPECIFIC GRAVITY, URINE: 1.015
Urobilinogen, Ur: NEGATIVE
pH, UA: 6 (ref 5.0–8.0)

## 2018-03-07 MED ORDER — LUBIPROSTONE 24 MCG PO CAPS: 24.0000 ug | ORAL_CAPSULE | Freq: Two times a day (BID) | ORAL | 2 refills | Status: DC

## 2018-03-07 MED ORDER — PANTOPRAZOLE SODIUM 40 MG PO TBEC: 40.0000 mg | DELAYED_RELEASE_TABLET | Freq: Every day | ORAL | 3 refills | Status: DC

## 2018-03-07 MED ORDER — FLUTICASONE FUROATE-VILANTEROL 200-25 MCG/INH IN AEPB: 1.0000 | INHALATION_SPRAY | Freq: Every day | RESPIRATORY_TRACT | 2 refills | Status: DC

## 2018-03-07 MED ORDER — TADALAFIL 20 MG PO TABS: 20.0000 mg | ORAL_TABLET | Freq: Every day | ORAL | 11 refills | Status: DC

## 2018-03-07 NOTE — Patient Instructions (Signed)
Thanks for trusting [place] with your health care and for coming in for a physical today.  Below are some general recommendations I have for you:  Yearly screenings See your eye doctor yearly for routine vision care. See your dentist yearly for routine dental care including hygiene visits twice yearly. See me here yearly for a routine physical and preventative care visit   Specific Concerns today:  . We will schedule you for lung CT scan . Please insurance about coverage for Cologuard test vs Colonoscopy.   If you had prior polyps, then a colonoscopy would be the more appropriate test for screening . We will call with lab results   Please follow up yearly for a physical.   Preventative Care for Adults - Male      [place]:  A routine yearly physical is a good way to check in with your primary care provider about your health and preventive screening. It is also an opportunity to share updates about your health and any concerns you have, and receive a thorough all-over exam.   Most health insurance companies pay for at least some preventative services.  Check with your health plan for specific coverages.  WHAT PREVENTATIVE SERVICES DO WOMEN NEED?  Adult men should have their weight and blood pressure checked regularly.   Men age 9 and older should have their cholesterol levels checked regularly.  Beginning at age 42 and continuing to age 86, men should be screened for colorectal cancer.  Certain people may need continued testing until age 65.  Updating vaccinations is part of preventative care.  Vaccinations help protect against diseases such as the flu.  Osteoporosis is a disease in which the bones lose minerals and strength as we age. Men ages 71 and over should discuss this with their caregivers  Lab tests are generally done as part of preventative care to screen for anemia and blood disorders, to screen for problems with the kidneys and liver, to screen for  bladder problems, to check blood sugar, and to check your cholesterol level.  Preventative services generally include counseling about diet, exercise, avoiding tobacco, drugs, excessive alcohol consumption, and sexually transmitted infections.    GENERAL RECOMMENDATIONS FOR GOOD HEALTH:  Healthy diet:  Eat a variety of foods, including fruit, vegetables, animal or vegetable protein, such as meat, fish, chicken, and eggs, or beans, lentils, tofu, and grains, such as rice.  Drink plenty of water daily.  Decrease saturated fat in the diet, avoid lots of red meat, processed foods, sweets, fast foods, and fried foods.  Exercise:  Aerobic exercise helps maintain good heart health. At least 30-40 minutes of moderate-intensity exercise is recommended. For example, a brisk walk that increases your heart rate and breathing. This should be done on most days of the week.   Find a type of exercise or a variety of exercises that you enjoy so that it becomes a part of your daily life.  Examples are running, walking, swimming, water aerobics, and biking.  For motivation and support, explore group exercise such as aerobic class, spin class, Zumba, Yoga,or  martial arts, etc.    Set exercise goals for yourself, such as a certain weight goal, walk or run in a race such as a 5k walk/run.  Speak to your primary care provider about exercise goals.  Disease prevention:  If you smoke or chew tobacco, find out from your caregiver how to quit. It can literally save your life, no matter how long you have  been a tobacco user. If you do not use tobacco, never begin.   Maintain a healthy diet and normal weight. Increased weight leads to problems with blood pressure and diabetes.   The Body Mass Index or BMI is a way of measuring how much of your body is fat. Having a BMI above 27 increases the risk of heart disease, diabetes, hypertension, stroke and other problems related to obesity. Your caregiver can help determine  your BMI and based on it develop an exercise and dietary program to help you achieve or maintain this important measurement at a healthful level.  High blood pressure causes heart and blood vessel problems.  Persistent high blood pressure should be treated with medicine if weight loss and exercise do not work.   Fat and cholesterol leaves deposits in your arteries that can block them. This causes heart disease and vessel disease elsewhere in your body.  If your cholesterol is found to be high, or if you have heart disease or certain other medical conditions, then you may need to have your cholesterol monitored frequently and be treated with medication.   Ask if you should have a cardiac stress test if your history suggests this. A stress test is a test done on a treadmill that looks for heart disease. This test can find disease prior to there being a problem.  Osteoporosis is a disease in which the bones lose minerals and strength as we age. This can result in serious bone fractures. Risk of osteoporosis can be identified using a bone density scan. Men ages 84 and over should discuss this with their caregivers. Ask your caregiver whether you should be taking a calcium supplement and Vitamin D, to reduce the rate of osteoporosis.   Avoid drinking alcohol in excess (more than two drinks per day).  Avoid use of street drugs. Do not share needles with anyone. Ask for professional help if you need assistance or instructions on stopping the use of alcohol, cigarettes, and/or drugs.  Brush your teeth twice a day with fluoride toothpaste, and floss once a day. Good oral hygiene prevents tooth decay and gum disease. The problems can be painful, unattractive, and can cause other health problems. Visit your dentist for a routine oral and dental check up and preventive care every 6-12 months.   Look at your skin regularly.  Use a mirror to look at your back. Notify your caregivers of changes in moles, especially  if there are changes in shapes, colors, a size larger than a pencil eraser, an irregular border, or development of new moles.  Safety:  Use seatbelts 100% of the time, whether driving or as a passenger.  Use safety devices such as hearing protection if you work in environments with loud noise or significant background noise.  Use safety glasses when doing any work that could send debris in to the eyes.  Use a helmet if you ride a bike or motorcycle.  Use appropriate safety gear for contact sports.  Talk to your caregiver about gun safety.  Use sunscreen with a SPF (or skin protection factor) of 15 or greater.  Lighter skinned people are at a greater risk of skin cancer. Don't forget to also wear sunglasses in order to protect your eyes from too much damaging sunlight. Damaging sunlight can accelerate cataract formation.   Practice safe sex. Use condoms. Condoms are used for birth control and to help reduce the spread of sexually transmitted infections (or STIs).  Some of the STIs are gonorrhea (  the clap), chlamydia, syphilis, trichomonas, herpes, HPV (human papilloma virus) and HIV (human immunodeficiency virus) which causes AIDS. The herpes, HIV and HPV are viral illnesses that have no cure. These can result in disability, cancer and death.   Keep carbon monoxide and smoke detectors in your home functioning at all times. Change the batteries every 6 months or use a model that plugs into the wall.   Vaccinations:  Stay up to date with your tetanus shots and other required immunizations. You should have a booster for tetanus every 10 years. Be sure to get your flu shot every year, since 5%-20% of the [place] population comes down with the flu. The flu vaccine changes each year, so being vaccinated once is not enough. Get your shot in the fall, before the flu season peaks.   Other vaccines to consider:  Human Papilloma Virus or HPV causes cancer of the cervix, and other infections that can be transmitted  from person to person. There is a vaccine for HPV, and males should get immunized between the ages of 21 and 79. It requires a series of 3 shots.   Pneumococcal vaccine to protect against certain types of pneumonia.  This is normally recommended for adults age 26 or older.  However, adults younger than 50 years old with certain underlying conditions such as diabetes, heart or lung disease should also receive the vaccine.  Shingles vaccine to protect against Varicella Zoster if you are older than age 57, or younger than 50 years old with certain underlying illness.  If you have not had the Shingrix vaccine, please call your insurer to inquire about coverage for the Shingrix vaccine given in 2 doses.   Some insurers cover this vaccine after age 11, some cover this after age 70.  If your insurer covers this, then call to schedule appointment to have this vaccine here  Hepatitis A vaccine to protect against a form of infection of the liver by a virus acquired from food.  Hepatitis B vaccine to protect against a form of infection of the liver by a virus acquired from blood or body fluids, particularly if you work in health care.  If you plan to travel internationally, check with your local health department for specific vaccination recommendations.   What should I know about cancer screening? Many types of cancers can be detected early and may often be prevented. Lung Cancer  You should be screened every year for lung cancer if: ? You are a current smoker who has smoked for at least 30 years. ? You are a former smoker who has quit within the past 15 years.  Talk to your health care provider about your screening options, when you should start screening, and how often you should be screened.  Colorectal Cancer  Routine colorectal cancer screening usually begins at 50 years of age and should be repeated every 5-10 years until you are 50 years old. You may need to be screened more often if early  forms of precancerous polyps or small growths are found. Your health care provider may recommend screening at an earlier age if you have risk factors for colon cancer.  Your health care provider may recommend using home test kits to check for hidden blood in the stool.  A small camera at the end of a tube can be used to examine your colon (sigmoidoscopy or colonoscopy). This checks for the earliest forms of colorectal cancer.  Prostate and Testicular Cancer  Depending on your age and overall  health, your health care provider may do certain tests to screen for prostate and testicular cancer.  Talk to your health care provider about any symptoms or concerns you have about testicular or prostate cancer.  Skin Cancer  Check your skin from head to toe regularly.  Tell your health care provider about any new moles or changes in moles, especially if: ? There is a change in a mole's size, shape, or color. ? You have a mole that is larger than a pencil eraser.  Always use sunscreen. Apply sunscreen liberally and repeat throughout the day.  Protect yourself by wearing long sleeves, pants, a wide-brimmed hat, and sunglasses when outside.

## 2018-03-07 NOTE — Telephone Encounter (Signed)
Pt states needs renewal for P.A. Cialis

## 2018-03-07 NOTE — Telephone Encounter (Signed)
Can you baby sit Mr. Grandinetti's prior auth for Cialis.   He is wanting it cleared up ASAP.  I think you may have already received the PA

## 2018-03-07 NOTE — Progress Notes (Signed)
Subjective:   HPI  Cody Tapia is a 52 y.o. male who presents for a complete physical.  Medical care team includes:  Dr. Melvyn Novas, pulmonology  Tysinger, Camelia Eng, PA-C here for primary care  Dentist, eye doctor  Sees dermatology   Concerns: Last colonoscopy 2009, had polyps, non tubular adenoma.  Was told repeat age 5yo.    Still smoking.  Wants lung cancer screen.  Last visit was having chest tightness. Within days of starting Breo and Xyzal the symptoms completely resolved!  Reviewed their medical, surgical, family, social, medication, and allergy history and updated chart as appropriate.  Past Medical History:  Diagnosis Date  . 23-polyvalent pneumococcal polysaccharide vaccine declined 7/14  . Anxiety    prior therapy, in remission  . ED (erectile dysfunction)   . GERD (gastroesophageal reflux disease)   . Tobacco use   . Wears glasses     Past Surgical History:  Procedure Laterality Date  . COLONOSCOPY  2009   [facility] for abdominal pain  . ESOPHAGOGASTRODUODENOSCOPY  2009   [facility], abdominal pain  . EYE SURGERY     strabismus surgery, right  . WISDOM TOOTH EXTRACTION      Social History   Socioeconomic History  . Marital status: Single    Spouse name: Not on file  . Number of children: Not on file  . Years of education: Not on file  . Highest education level: Not on file  Occupational History  . Not on file  Social Needs  . Financial resource strain: Not on file  . Food insecurity:    Worry: Not on file    Inability: Not on file  . Transportation needs:    Medical: Not on file    Non-medical: Not on file  Tobacco Use  . Smoking status: Current Some Day Smoker    Packs/day: 0.50    Years: 30.00    Pack years: 15.00    Types: Cigarettes  . Smokeless tobacco: Never Used  Substance and Sexual Activity  . Alcohol use: Yes    Alcohol/week: 1.8 oz    Types: 3 Glasses of wine per week  . Drug use: No  . Sexual activity:  Not on file  Lifestyle  . Physical activity:    Days per week: Not on file    Minutes per session: Not on file  . Stress: Not on file  Relationships  . Social connections:    Talks on phone: Not on file    Gets together: Not on file    Attends religious service: Not on file    Active member of club or organization: Not on file    Attends meetings of clubs or organizations: Not on file    Relationship status: Not on file  . Intimate partner violence:    Fear of current or ex partner: Not on file    Emotionally abused: Not on file    Physically abused: Not on file    Forced sexual activity: Not on file  Other Topics Concern  . Not on file  Social History Narrative   Girlfriend, domestic partner, teenage daughter.   Exercise - cardio, elliptical, power walking, free weights 3 days per week.  Working out with Physiological scientist.  Mortgage lending x 20 years.      Family History  Problem Relation Age of Onset  . Heart disease Father 76       stent, CAD  . COPD Mother   . Sudden death  Neg Hx   . Heart attack Neg Hx   . Hyperlipidemia Neg Hx   . Diabetes Neg Hx   . Hypertension Neg Hx   . Cancer Neg Hx   . Stroke Neg Hx      Current Outpatient Medications:  .  famotidine (PEPCID) 20 MG tablet, One at bedtime, Disp: 30 tablet, Rfl: 2 .  fluticasone furoate-vilanterol (BREO ELLIPTA) 200-25 MCG/INH AEPB, Inhale 1 puff into the lungs daily., Disp: 14 each, Rfl: 0 .  levocetirizine (XYZAL) 5 MG tablet, Take 1 tablet (5 mg total) by mouth every evening., Disp: 30 tablet, Rfl: 0 .  lubiprostone (AMITIZA) 24 MCG capsule, Take 1 capsule (24 mcg total) by mouth 2 (two) times daily with a meal., Disp: 60 capsule, Rfl: 2 .  Multiple Vitamin (MULTIVITAMIN) tablet, Take 1 tablet by mouth daily., Disp: , Rfl:  .  pantoprazole (PROTONIX) 40 MG tablet, Take 1 tablet (40 mg total) by mouth daily., Disp: 90 tablet, Rfl: 0 .  tadalafil (CIALIS) 20 MG tablet, Take 1 tablet (20 mg total) by mouth  daily. as directed, Disp: 10 tablet, Rfl: 5 .  diltiazem 2 % GEL, Apply 1 application topically 2 (two) times daily. (Patient not taking: Reported on 02/22/2018), Disp: 30 g, Rfl: 1 .  hydrocortisone (ANUSOL-HC) 25 MG suppository, Place 1 suppository (25 mg total) 2 (two) times daily rectally. (Patient not taking: Reported on 02/22/2018), Disp: 12 suppository, Rfl: 0  No Known Allergies    Review of Systems Constitutional: -fever, -chills, -sweats, -unexpected weight change, -decreased appetite, -fatigue Allergy: -sneezing, -itching, -congestion Dermatology: -changing moles, --rash, -lumps ENT: -runny nose, -ear pain, -sore throat, -hoarseness, -sinus pain, -teeth pain, - ringing in ears, -hearing loss, -nosebleeds Cardiology: -chest pain, -palpitations, -swelling, -difficulty breathing when lying flat, -waking up short of breath Respiratory: -cough, -shortness of breath, -difficulty breathing with exercise or exertion, -wheezing, -coughing up blood Gastroenterology: -abdominal pain, -nausea, -vomiting, -diarrhea, -constipation, -blood in stool, -changes in bowel movement, -difficulty swallowing or eating Hematology: -bleeding, -bruising  Musculoskeletal: -joint aches, -muscle aches, -joint swelling, -back pain, -neck pain, -cramping, -changes in gait Ophthalmology: denies vision changes, eye redness, itching, discharge Urology: -burning with urination, -difficulty urinating, -blood in urine, -urinary frequency, -urgency, -incontinence Neurology: -headache, -weakness, -tingling, -numbness, -memory loss, -falls, -dizziness Psychology: -depressed mood, -agitation, -sleep problems     Objective:   Physical Exam  BP (!) 146/90 (BP Location: Left Arm, Patient Position: Sitting)   Pulse 77   Temp 98 F (36.7 C)   Ht 5\' 10"  (1.778 m)   Wt 208 lb 6.4 oz (94.5 kg)   SpO2 96%   BMI 29.90 kg/m   Wt Readings from Last 3 Encounters:  03/07/18 208 lb 6.4 oz (94.5 kg)  02/22/18 207 lb 12.8 oz  (94.3 kg)  09/22/17 202 lb 12.8 oz (92 kg)   BP Readings from Last 3 Encounters:  03/07/18 (!) 146/90  02/22/18 128/84  09/22/17 134/82   General appearance: alert, no distress, WD/WN, white male Skin: scattered macules, no worrisome lesions HEENT: normocephalic, conjunctiva/corneas normal, sclerae anicteric, PERRLA, EOMi, nares patent, no discharge or erythema, pharynx normal Oral cavity: MMM, tongue normal, teeth normal Neck: supple, no lymphadenopathy, no thyromegaly, no masses, normal ROM, no bruits Chest: non tender, normal shape and expansion Heart: RRR, normal S1, S2, no murmurs Lungs: CTA bilaterally, no wheezes, rhonchi, or rales Abdomen: +bs, soft, non tender, non distended, no masses, no hepatomegaly, no splenomegaly, no bruits Back: non tender, normal ROM, no  scoliosis Musculoskeletal: upper extremities non tender, no obvious deformity, normal ROM throughout, lower extremities non tender, no obvious deformity, normal ROM throughout Extremities: no edema, no cyanosis, no clubbing Pulses: 2+ symmetric, upper and lower extremities, normal cap refill Neurological: alert, oriented x 3, CN2-12 intact, strength normal upper extremities and lower extremities, sensation normal throughout, DTRs 2+ throughout, no cerebellar signs, gait normal Psychiatric: normal affect, behavior normal, pleasant  GU: normal male external genitalia, circumcised, non tender, no masses, no hernia, no lymphadenopathy Rectal: deferred   Assessment and Plan :      Encounter Diagnoses  Name Primary?  . Routine general medical examination at a health care facility Yes  . Screening for prostate cancer   . Abnormal PFT   . Chest tightness   . Elevated blood-pressure reading without diagnosis of hypertension   . Cigarette smoker   . Erectile dysfunction, unspecified erectile dysfunction type   . Polyp of colon, unspecified part of colon, unspecified type   . Gastroesophageal reflux disease, esophagitis  presence not specified   . Vaccine counseling   . Encounter for screening for malignant neoplasm of respiratory organs   . Encounter for screening for lung cancer   . Extrinsic asthma without complication, unspecified asthma severity, unspecified whether persistent     Physical exam - discussed healthy lifestyle, diet, exercise, preventative care, vaccinations, and addressed their concerns.   See your eye doctor yearly for routine vision care. See your dentist yearly for routine dental care including hygiene visits twice yearly. He will call insurance about Colonoscopy vs Cologuard although colonoscopy is the preferred test in his case given hx/o colon polyps, and he will call about Shingrix Allergic asthma - much improved on Breo and Xyzal.  advise he use this regiment in spring and fall allergy season. Follow-up pending labs  Kerrion was seen today for other.  Diagnoses and all orders for this visit:  Routine general medical examination at a health care facility -     POCT Urinalysis DIP (Proadvantage Device) -     Comprehensive metabolic panel -     CBC -     Lipid panel -     PSA -     Hemoglobin A1c  Screening for prostate cancer -     PSA  Abnormal PFT  Chest tightness  Elevated blood-pressure reading without diagnosis of hypertension  Cigarette smoker  Erectile dysfunction, unspecified erectile dysfunction type  Polyp of colon, unspecified part of colon, unspecified type  Gastroesophageal reflux disease, esophagitis presence not specified  Vaccine counseling  Encounter for screening for malignant neoplasm of respiratory organs -     CT CHEST LUNG CA SCREEN LOW DOSE W/O CM; Future  Encounter for screening for lung cancer  Extrinsic asthma without complication, unspecified asthma severity, unspecified whether persistent

## 2018-03-08 ENCOUNTER — Other Ambulatory Visit: Payer: Self-pay

## 2018-03-08 ENCOUNTER — Other Ambulatory Visit: Payer: Self-pay | Admitting: Medical

## 2018-03-08 DIAGNOSIS — M9902 Segmental and somatic dysfunction of thoracic region: Secondary | ICD-10-CM | POA: Diagnosis not present

## 2018-03-08 DIAGNOSIS — R059 Cough, unspecified: Secondary | ICD-10-CM

## 2018-03-08 DIAGNOSIS — M5020 Other cervical disc displacement, unspecified cervical region: Secondary | ICD-10-CM | POA: Diagnosis not present

## 2018-03-08 DIAGNOSIS — M5412 Radiculopathy, cervical region: Secondary | ICD-10-CM | POA: Diagnosis not present

## 2018-03-08 DIAGNOSIS — R05 Cough: Secondary | ICD-10-CM

## 2018-03-08 DIAGNOSIS — M9901 Segmental and somatic dysfunction of cervical region: Secondary | ICD-10-CM | POA: Diagnosis not present

## 2018-03-08 LAB — LIPID PANEL
Chol/HDL Ratio: 3.4 ratio (ref 0.0–5.0)
Cholesterol, Total: 217 mg/dL — ABNORMAL HIGH (ref 100–199)
HDL: 64 mg/dL (ref >39)
LDL Calculated: 143 mg/dL — ABNORMAL HIGH (ref 0–99)
Triglycerides: 52 mg/dL (ref 0–149)
VLDL CHOLESTEROL CAL: 10 mg/dL (ref 5–40)

## 2018-03-08 LAB — COMPREHENSIVE METABOLIC PANEL
ALT: 39 IU/L (ref 0–44)
AST: 32 IU/L (ref 0–40)
Albumin/Globulin Ratio: 2.1 (ref 1.2–2.2)
Albumin: 4.7 g/dL (ref 3.5–5.5)
Alkaline Phosphatase: 73 IU/L (ref 39–117)
BUN/Creatinine Ratio: 16 (ref 9–20)
BUN: 18 mg/dL (ref 6–24)
Bilirubin Total: 0.3 mg/dL (ref 0.0–1.2)
CO2: 25 mmol/L (ref 20–29)
CREATININE: 1.13 mg/dL (ref 0.76–1.27)
Calcium: 9.7 mg/dL (ref 8.7–10.2)
Chloride: 99 mmol/L (ref 96–106)
GFR calc Af Amer: 88 mL/min/{1.73_m2} (ref >59)
GFR calc non Af Amer: 76 mL/min/{1.73_m2} (ref >59)
GLUCOSE: 98 mg/dL (ref 65–99)
Globulin, Total: 2.2 g/dL (ref 1.5–4.5)
Potassium: 4.2 mmol/L (ref 3.5–5.2)
SODIUM: 142 mmol/L (ref 134–144)
Total Protein: 6.9 g/dL (ref 6.0–8.5)

## 2018-03-08 LAB — HEMOGLOBIN A1C
Est. average glucose Bld gHb Est-mCnc: 117 mg/dL
Hgb A1c MFr Bld: 5.7 % — ABNORMAL HIGH (ref 4.8–5.6)

## 2018-03-08 LAB — CBC
HEMATOCRIT: 44.1 % (ref 37.5–51.0)
Hemoglobin: 14.8 g/dL (ref 13.0–17.7)
MCH: 31.2 pg (ref 26.6–33.0)
MCHC: 33.6 g/dL (ref 31.5–35.7)
MCV: 93 fL (ref 79–97)
PLATELETS: 285 10*3/uL (ref 150–379)
RBC: 4.74 x10E6/uL (ref 4.14–5.80)
RDW: 13.7 % (ref 12.3–15.4)
WBC: 7.5 10*3/uL (ref 3.4–10.8)

## 2018-03-08 LAB — PSA: PROSTATE SPECIFIC AG, SERUM: 0.2 ng/mL (ref 0.0–4.0)

## 2018-03-10 DIAGNOSIS — M9902 Segmental and somatic dysfunction of thoracic region: Secondary | ICD-10-CM | POA: Diagnosis not present

## 2018-03-10 DIAGNOSIS — M5412 Radiculopathy, cervical region: Secondary | ICD-10-CM | POA: Diagnosis not present

## 2018-03-10 DIAGNOSIS — M9901 Segmental and somatic dysfunction of cervical region: Secondary | ICD-10-CM | POA: Diagnosis not present

## 2018-03-10 DIAGNOSIS — M5020 Other cervical disc displacement, unspecified cervical region: Secondary | ICD-10-CM | POA: Diagnosis not present

## 2018-03-10 NOTE — Telephone Encounter (Signed)
P.A. TADALAFIL 

## 2018-03-11 NOTE — Telephone Encounter (Signed)
P.A. Approved for #8 for 30 days til 03/10/19.  Called pharmacy and went thru for $20.  Left message for pt

## 2018-03-11 NOTE — Telephone Encounter (Signed)
See P.A. Approved til 03/09/18

## 2018-03-15 DIAGNOSIS — M5412 Radiculopathy, cervical region: Secondary | ICD-10-CM | POA: Diagnosis not present

## 2018-03-15 DIAGNOSIS — M9901 Segmental and somatic dysfunction of cervical region: Secondary | ICD-10-CM | POA: Diagnosis not present

## 2018-03-15 DIAGNOSIS — M9902 Segmental and somatic dysfunction of thoracic region: Secondary | ICD-10-CM | POA: Diagnosis not present

## 2018-03-15 DIAGNOSIS — M5020 Other cervical disc displacement, unspecified cervical region: Secondary | ICD-10-CM | POA: Diagnosis not present

## 2018-03-18 DIAGNOSIS — M5020 Other cervical disc displacement, unspecified cervical region: Secondary | ICD-10-CM | POA: Diagnosis not present

## 2018-03-18 DIAGNOSIS — M9902 Segmental and somatic dysfunction of thoracic region: Secondary | ICD-10-CM | POA: Diagnosis not present

## 2018-03-18 DIAGNOSIS — M9901 Segmental and somatic dysfunction of cervical region: Secondary | ICD-10-CM | POA: Diagnosis not present

## 2018-03-18 DIAGNOSIS — M5412 Radiculopathy, cervical region: Secondary | ICD-10-CM | POA: Diagnosis not present

## 2018-03-21 ENCOUNTER — Other Ambulatory Visit: Payer: BLUE CROSS/BLUE SHIELD

## 2018-03-22 DIAGNOSIS — M5020 Other cervical disc displacement, unspecified cervical region: Secondary | ICD-10-CM | POA: Diagnosis not present

## 2018-03-22 DIAGNOSIS — M9901 Segmental and somatic dysfunction of cervical region: Secondary | ICD-10-CM | POA: Diagnosis not present

## 2018-03-22 DIAGNOSIS — M5412 Radiculopathy, cervical region: Secondary | ICD-10-CM | POA: Diagnosis not present

## 2018-03-22 DIAGNOSIS — M9902 Segmental and somatic dysfunction of thoracic region: Secondary | ICD-10-CM | POA: Diagnosis not present

## 2018-03-31 DIAGNOSIS — M25511 Pain in right shoulder: Secondary | ICD-10-CM | POA: Diagnosis not present

## 2018-03-31 DIAGNOSIS — M4722 Other spondylosis with radiculopathy, cervical region: Secondary | ICD-10-CM | POA: Diagnosis not present

## 2018-04-06 DIAGNOSIS — M5020 Other cervical disc displacement, unspecified cervical region: Secondary | ICD-10-CM | POA: Diagnosis not present

## 2018-04-06 DIAGNOSIS — M9901 Segmental and somatic dysfunction of cervical region: Secondary | ICD-10-CM | POA: Diagnosis not present

## 2018-04-06 DIAGNOSIS — M5412 Radiculopathy, cervical region: Secondary | ICD-10-CM | POA: Diagnosis not present

## 2018-04-06 DIAGNOSIS — M9902 Segmental and somatic dysfunction of thoracic region: Secondary | ICD-10-CM | POA: Diagnosis not present

## 2018-04-07 ENCOUNTER — Other Ambulatory Visit: Payer: Self-pay | Admitting: Medical

## 2018-04-08 ENCOUNTER — Other Ambulatory Visit: Payer: Self-pay | Admitting: Medical

## 2018-04-11 ENCOUNTER — Other Ambulatory Visit: Payer: BLUE CROSS/BLUE SHIELD

## 2018-04-12 DIAGNOSIS — M5412 Radiculopathy, cervical region: Secondary | ICD-10-CM | POA: Diagnosis not present

## 2018-04-12 DIAGNOSIS — M9902 Segmental and somatic dysfunction of thoracic region: Secondary | ICD-10-CM | POA: Diagnosis not present

## 2018-04-12 DIAGNOSIS — M5020 Other cervical disc displacement, unspecified cervical region: Secondary | ICD-10-CM | POA: Diagnosis not present

## 2018-04-12 DIAGNOSIS — M9901 Segmental and somatic dysfunction of cervical region: Secondary | ICD-10-CM | POA: Diagnosis not present

## 2018-04-28 ENCOUNTER — Ambulatory Visit: Payer: BLUE CROSS/BLUE SHIELD | Admitting: Medical

## 2018-04-28 ENCOUNTER — Encounter: Payer: Self-pay | Admitting: Medical

## 2018-04-28 VITALS — BP 130/88 | HR 72 | Temp 98.0°F | Resp 16 | Ht 71.0 in | Wt 210.0 lb

## 2018-04-28 DIAGNOSIS — H9201 Otalgia, right ear: Secondary | ICD-10-CM

## 2018-04-28 DIAGNOSIS — M542 Cervicalgia: Secondary | ICD-10-CM

## 2018-04-28 DIAGNOSIS — M792 Neuralgia and neuritis, unspecified: Secondary | ICD-10-CM

## 2018-04-28 NOTE — Progress Notes (Signed)
Subjective: Chief Complaint  Patient presents with  . right ear pain    right ear pain. temporal area is tender, jaw pain. going on a couple weeks   Here for right ear pain, has tenderness in temple and jaw.   Been going on a couple of weeks.  Has been dealing with pinched nerve for 3 months.  Has seen ortho, has had sports massage, been to chiropractor, but still has tingle in right arm and pain.    He wanted to come in and make sure no ear infection.  He denies sinus pressure, headache, sore throat, congestion, no drainage, no fever, no NVD, no teeth pain, no pain with chewing.     He has hx/o shingles of right upper back in the past.   Past Medical History:  Diagnosis Date  . 23-polyvalent pneumococcal polysaccharide vaccine declined 7/14  . Anxiety    prior therapy, in remission  . ED (erectile dysfunction)   . GERD (gastroesophageal reflux disease)   . Tobacco use   . Wears glasses    Current Outpatient Medications on File Prior to Visit  Medication Sig Dispense Refill  . Multiple Vitamin (MULTIVITAMIN) tablet Take 1 tablet by mouth daily.    . pantoprazole (PROTONIX) 40 MG tablet Take 1 tablet (40 mg total) by mouth daily. 90 tablet 3  . tadalafil (CIALIS) 20 MG tablet Take 1 tablet (20 mg total) by mouth daily. as directed 10 tablet 11   No current facility-administered medications on file prior to visit.    Past Surgical History:  Procedure Laterality Date  . COLONOSCOPY  2009   [facility] for abdominal pain  . ESOPHAGOGASTRODUODENOSCOPY  2009   [facility], abdominal pain  . EYE SURGERY     strabismus surgery, right  . WISDOM TOOTH EXTRACTION     ROS as in subjective    Objective: BP 130/88   Pulse 72   Temp 98 F (36.7 C) (Oral)   Resp 16   Ht 5\' 11"  (1.803 m)   Wt 210 lb (95.3 kg)   SpO2 98%   BMI 29.29 kg/m   General appearance: alert, no distress, WD/WN HEENT: normocephalic, sclerae anicteric, TMs pearly, nares patent, no discharge or  erythema, pharynx normal, no TMJ tenderness Skin: no rash, no erythema, no warmth Oral cavity: MMM, no lesions Neck: supple, no lymphadenopathy, no thyromegaly, no masses     Assessment: Encounter Diagnoses  Name Primary?  . Otalgia of right ear Yes  . Neck pain   . Radicular pain in right arm      Plan: His exam and symptoms likely still suggest radicular issue.  He is seeing orthopedist and chiropractor for this.  Reassured no ear infection no obvious shingles, no obvious cellulitis, no obvious tooth abscess no other finding to explain his ear pain other than his radicular issue   Axell was seen today for right ear pain.  Diagnoses and all orders for this visit:  Otalgia of right ear  Neck pain  Radicular pain in right arm

## 2018-06-09 ENCOUNTER — Ambulatory Visit: Payer: Self-pay | Admitting: Medical

## 2018-06-14 ENCOUNTER — Other Ambulatory Visit: Payer: BLUE CROSS/BLUE SHIELD

## 2018-08-22 DIAGNOSIS — B009 Herpesviral infection, unspecified: Secondary | ICD-10-CM | POA: Diagnosis not present

## 2018-08-22 DIAGNOSIS — L821 Other seborrheic keratosis: Secondary | ICD-10-CM | POA: Diagnosis not present

## 2018-08-22 DIAGNOSIS — L720 Epidermal cyst: Secondary | ICD-10-CM | POA: Diagnosis not present

## 2018-08-22 DIAGNOSIS — L918 Other hypertrophic disorders of the skin: Secondary | ICD-10-CM | POA: Diagnosis not present

## 2018-09-07 DIAGNOSIS — F4322 Adjustment disorder with anxiety: Secondary | ICD-10-CM | POA: Diagnosis not present

## 2018-10-18 ENCOUNTER — Ambulatory Visit: Payer: BLUE CROSS/BLUE SHIELD | Admitting: Medical

## 2018-10-18 ENCOUNTER — Telehealth: Payer: Self-pay | Admitting: Medical

## 2018-10-18 ENCOUNTER — Encounter: Payer: Self-pay | Admitting: Medical

## 2018-10-18 VITALS — BP 130/90 | HR 83 | Temp 98.2°F | Resp 16 | Ht 71.0 in | Wt 216.0 lb

## 2018-10-18 DIAGNOSIS — Z23 Encounter for immunization: Secondary | ICD-10-CM

## 2018-10-18 DIAGNOSIS — R918 Other nonspecific abnormal finding of lung field: Secondary | ICD-10-CM

## 2018-10-18 DIAGNOSIS — G8929 Other chronic pain: Secondary | ICD-10-CM | POA: Insufficient documentation

## 2018-10-18 DIAGNOSIS — R4586 Emotional lability: Secondary | ICD-10-CM | POA: Insufficient documentation

## 2018-10-18 DIAGNOSIS — R062 Wheezing: Secondary | ICD-10-CM | POA: Diagnosis not present

## 2018-10-18 DIAGNOSIS — R079 Chest pain, unspecified: Secondary | ICD-10-CM

## 2018-10-18 DIAGNOSIS — K219 Gastro-esophageal reflux disease without esophagitis: Secondary | ICD-10-CM | POA: Diagnosis not present

## 2018-10-18 DIAGNOSIS — R942 Abnormal results of pulmonary function studies: Secondary | ICD-10-CM

## 2018-10-18 DIAGNOSIS — F17210 Nicotine dependence, cigarettes, uncomplicated: Secondary | ICD-10-CM

## 2018-10-18 DIAGNOSIS — R0789 Other chest pain: Secondary | ICD-10-CM

## 2018-10-18 MED ORDER — FLUTICASONE FUROATE-VILANTEROL 200-25 MCG/INH IN AEPB: 1.0000 | INHALATION_SPRAY | Freq: Every day | RESPIRATORY_TRACT | 11 refills | Status: DC

## 2018-10-18 MED ORDER — ALBUTEROL SULFATE HFA 108 (90 BASE) MCG/ACT IN AERS
2.0000 | INHALATION_SPRAY | Freq: Four times a day (QID) | RESPIRATORY_TRACT | 1 refills | Status: DC | PRN
Start: 2018-10-18 — End: 2019-09-04

## 2018-10-18 NOTE — Progress Notes (Signed)
Subjective: Chief Complaint  Patient presents with  . follow up    follow up fasting    Here for f/u.   Here to discuss several things.  Originally thought he was here to recheck cholesterol today so he is fasting.  He never started Pravachol from April 2019 discussion.  Overall he said he is in a bad mood today does not feel great in general.  He cannot seem to stop smoking although he knows he needs to.  He is successfully his business but yet is not happy about his place in life mainly in relation to his physical limitations with his breathing and chest pressure.  He has seen the cardiologist who told him his symptoms were due to acid reflux and to take PPI which he does.  We had discussed breathing back in April 2019 with his physical, and he was on Breo inhaler for short period time for new diagnosis of asthma but he quit taking this.  He still has complaint of chest pressure, wheezing, exercise intolerance.  He continues to smoke.  He is failed several attempts that smoking cessation with several different medications, some noted in the allergy list today.  In the past year he is worked on [employer] and exercise, worked with a Physiological scientist.  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  He recently did some personal development class through [employer] and called [employer]   Seeing a counselor weekly for the past month.  Past Medical History:  Diagnosis Date  . 25-polyvalent pneumococcal polysaccharide vaccine declined 7/14  . Anxiety    prior therapy, in remission  . ED (erectile dysfunction)   . GERD (gastroesophageal reflux disease)   . Tobacco use   . Wears glasses    Current Outpatient Medications on File Prior to Visit  Medication Sig Dispense Refill  . Multiple Vitamin (MULTIVITAMIN) tablet Take 1 tablet by mouth daily.    . pantoprazole (PROTONIX) 40 MG tablet Take 1 tablet  (40 mg total) by mouth daily. 90 tablet 3  . tadalafil (CIALIS) 20 MG tablet Take 1 tablet (20 mg total) by mouth daily. as directed 10 tablet 11   No current facility-administered medications on file prior to visit.     Objective: BP 130/90   Pulse 83   Temp 98.2 F (36.8 C) (Oral)   Resp 16   Ht 5\' 11"  (1.803 m)   Wt 216 lb (98 kg)   SpO2 95%   BMI 30.13 kg/m   General appearance: alert, no distress, WD/WN Neck: supple, no lymphadenopathy, no thyromegaly, no masses, no JVD Heart: RRR, normal S1, S2, no murmurs Lungs: decreased breath sounds, faint wheezes, no rhonchi, or rales Abdomen: +bs, soft, non tender, non distended, no masses, no hepatomegaly, no splenomegaly Pulses: 2+ symmetric, upper and lower extremities, normal cap refill Ext: no edema Psych: somewhat irritable today   Assessment: Encounter Diagnoses  Name Primary?  . Chronic chest pain Yes  . Wheezing   . Abnormal lung field   . Gastroesophageal reflux disease, esophagitis presence not specified   . Abnormal PFT   . Chest tightness   . Cigarette smoker   . Need for influenza vaccination   . Mood change      Plan: I advised that his symptoms still represent asthma, including GERD and allergy triggers, ongoing tobacco use.  Counseled again on the need to try  to work to quit smoking.  Since he has failed medications I reiterated the need to try counseling.  Advised to call the 1 800 quit now hotline to get help with this and speak to his counselor about this as well  We will go ahead and pursue diagnostic chest CT for chronic chest pain noncardiac, wheezing, abnormal lung field, abnormal PFT, chest tightness  He will begin back on Breo daily preventative inhaler, albuterol as needed, and plan to do a full lung function study through [facility] in a month  Counseled on the influenza virus vaccine.  Vaccine information sheet given.  Influenza vaccine given after consent obtained.  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  Diagnoses and all orders for this visit:  Chronic chest pain -     Pulmonary Function Test; Future  Wheezing -     Pulmonary Function Test; Future  Abnormal lung field -     Pulmonary Function Test; Future  Gastroesophageal reflux disease, esophagitis presence not specified -     Pulmonary Function Test; Future  Abnormal PFT -     Pulmonary Function Test; Future  Chest tightness -     Pulmonary Function Test; Future  Cigarette smoker -     Pulmonary Function Test; Future  Need for influenza vaccination -     Flu Vaccine QUAD 6+ mos PF IM (Fluarix Quad PF)  Mood change  Other orders -     albuterol (PROVENTIL HFA;VENTOLIN HFA) 108 (90 Base) MCG/ACT inhaler; Inhale 2 puffs into the lungs every 6 (six) hours as needed for wheezing. -     fluticasone furoate-vilanterol (BREO ELLIPTA) 200-25 MCG/INH AEPB; Inhale 1 puff into the lungs daily.

## 2018-10-18 NOTE — Telephone Encounter (Signed)
2 orders please:   I would like to set him up for a diagnostic chest CT due to lung field decreased, abnormal PFT, chest tightness, wheezing, chronic chest pain   I would like to set him up for a full or complete pulmonary function test through [facility] for a month from now after he starts back on [place]

## 2018-10-24 ENCOUNTER — Other Ambulatory Visit: Payer: Self-pay

## 2018-10-24 DIAGNOSIS — R0602 Shortness of breath: Secondary | ICD-10-CM

## 2018-10-24 DIAGNOSIS — F17210 Nicotine dependence, cigarettes, uncomplicated: Secondary | ICD-10-CM

## 2018-10-24 DIAGNOSIS — G8929 Other chronic pain: Secondary | ICD-10-CM

## 2018-10-24 DIAGNOSIS — R942 Abnormal results of pulmonary function studies: Secondary | ICD-10-CM

## 2018-10-24 DIAGNOSIS — R079 Chest pain, unspecified: Secondary | ICD-10-CM

## 2018-10-24 DIAGNOSIS — R918 Other nonspecific abnormal finding of lung field: Secondary | ICD-10-CM

## 2018-10-24 DIAGNOSIS — R0789 Other chest pain: Secondary | ICD-10-CM

## 2018-10-24 NOTE — Telephone Encounter (Signed)
Done

## 2018-10-28 ENCOUNTER — Other Ambulatory Visit: Payer: Self-pay | Admitting: Medical

## 2018-11-01 ENCOUNTER — Other Ambulatory Visit: Payer: BLUE CROSS/BLUE SHIELD

## 2018-11-18 ENCOUNTER — Ambulatory Visit
Admission: RE | Admit: 2018-11-18 | Discharge: 2018-11-18 | Disposition: A | Discharge status: Home / Self Care | Payer: BLUE CROSS/BLUE SHIELD | Source: Ambulatory Visit | Attending: Medical | Admitting: Medical

## 2018-11-18 DIAGNOSIS — R0789 Other chest pain: Secondary | ICD-10-CM | POA: Diagnosis not present

## 2018-11-18 DIAGNOSIS — R0602 Shortness of breath: Secondary | ICD-10-CM

## 2018-11-18 DIAGNOSIS — G8929 Other chronic pain: Secondary | ICD-10-CM

## 2018-11-18 DIAGNOSIS — R918 Other nonspecific abnormal finding of lung field: Secondary | ICD-10-CM

## 2018-11-18 DIAGNOSIS — R942 Abnormal results of pulmonary function studies: Secondary | ICD-10-CM

## 2018-11-18 DIAGNOSIS — R079 Chest pain, unspecified: Secondary | ICD-10-CM

## 2018-11-18 DIAGNOSIS — F17210 Nicotine dependence, cigarettes, uncomplicated: Secondary | ICD-10-CM

## 2018-11-18 IMAGING — CT CT CHEST W/O CM
2 of 4 series · 12 of 36 positions shown, 15 images · non-contrast
Comparison: Chest x-ray of [DATE]

CLINICAL DATA: Short of breath, chest pain and tightness over the
last year, smoking history

EXAM:
CT CHEST WITHOUT CONTRAST
TECHNIQUE: Multidetector CT imaging of the chest was performed following the
standard protocol without IV contrast.

[Series 2: chest 2.00 br40 s3 ax · axial · 0.64mm/px · z∈[+1476,+1770]mm · 9 of 175 slices shown, 12 images]
[im 14/175  mediastinal]
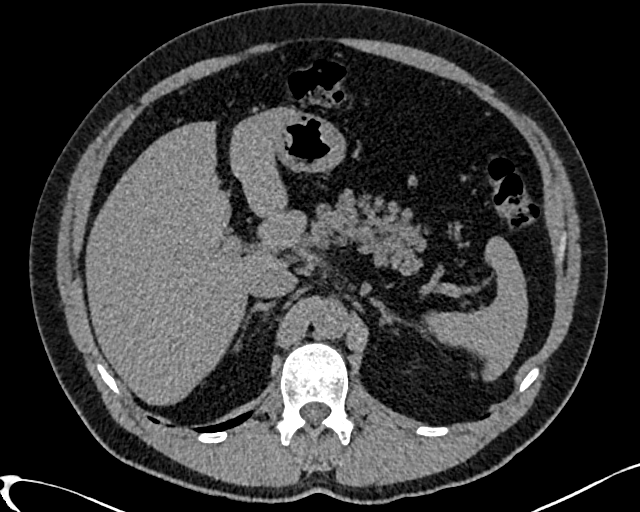
[im 14/175  lung]
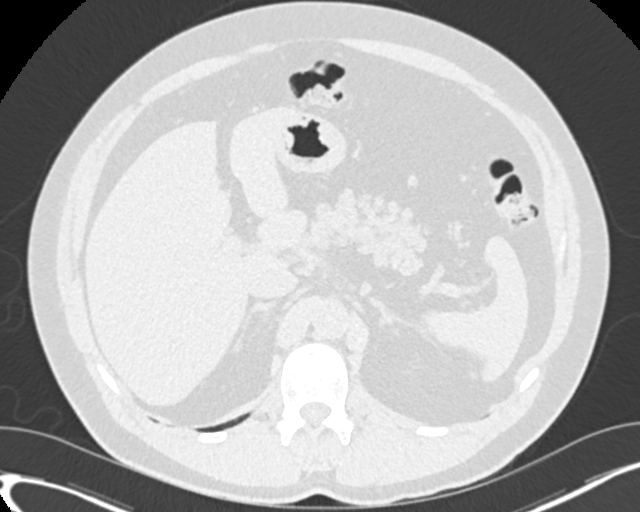
[im 41/175  lung]
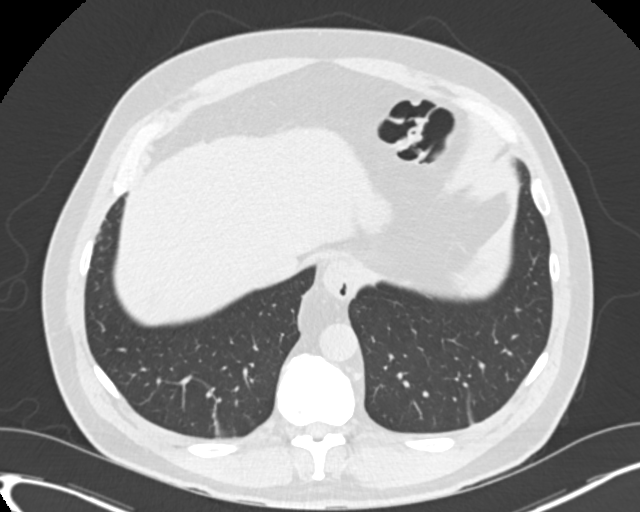
[im 54/175  lung]
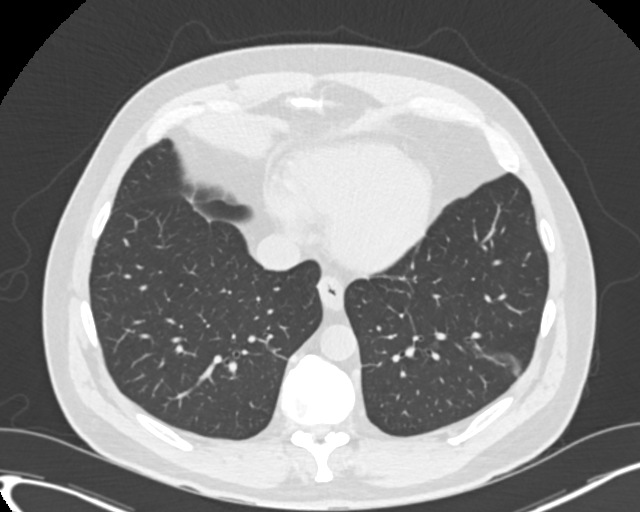
[im 67/175  lung]
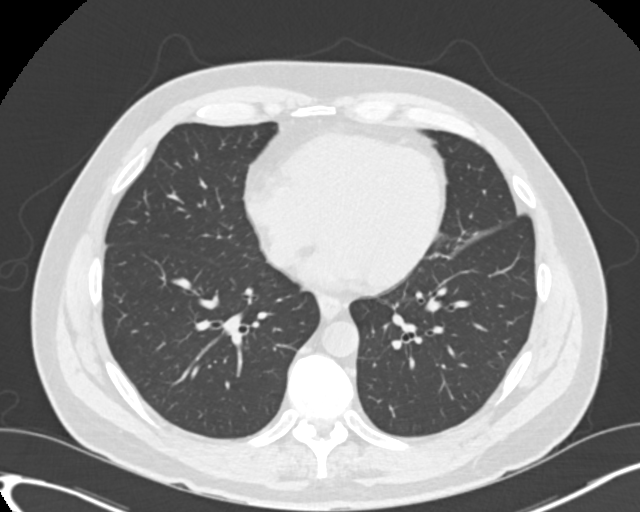
[im 94/175  mediastinal]
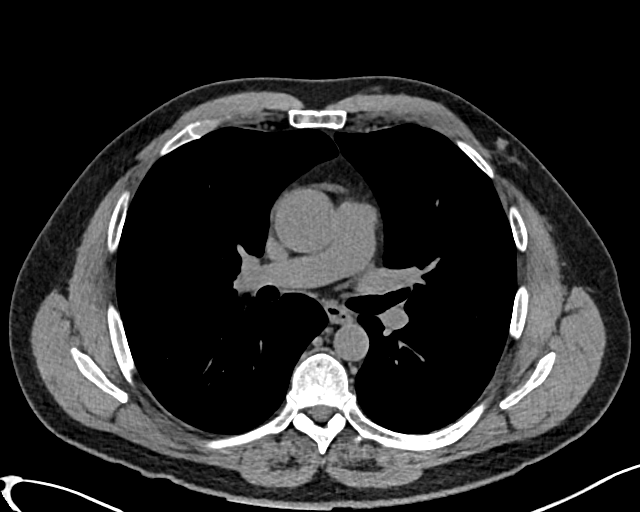
[im 94/175  lung]
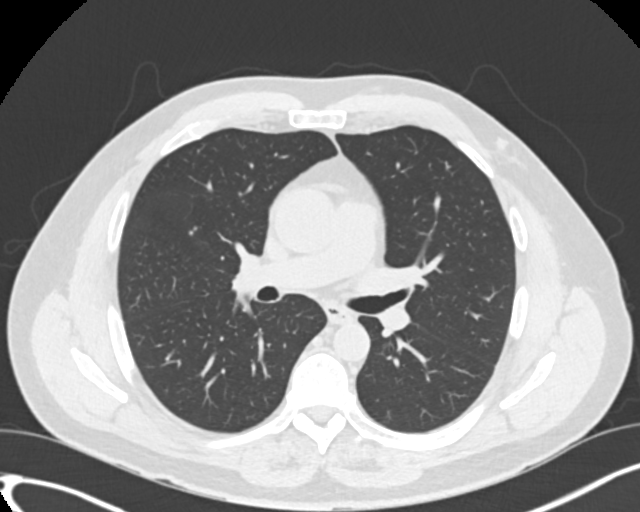
[im 108/175  lung]
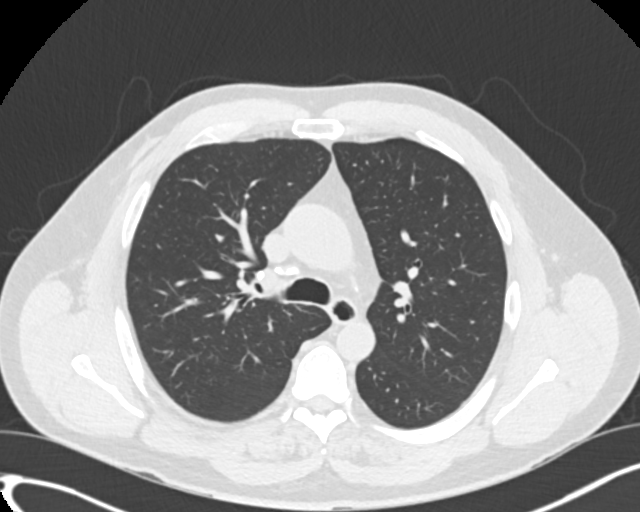
[im 121/175  lung]
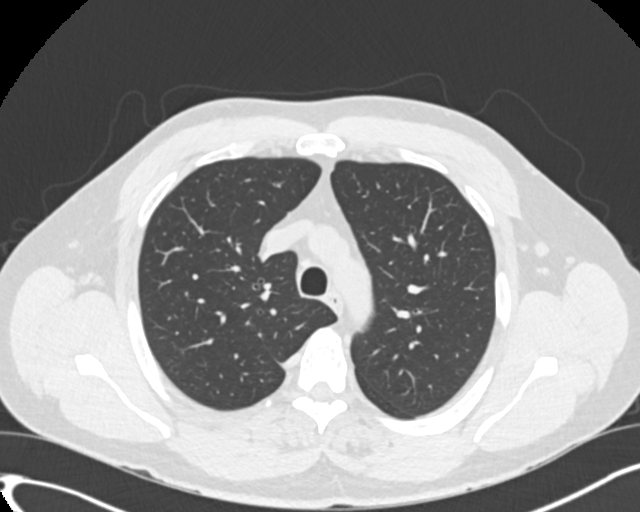
[im 148/175  lung]
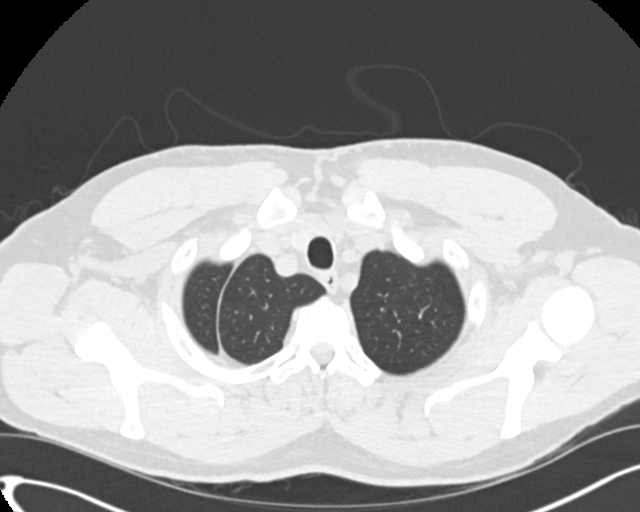
[im 161/175  mediastinal]
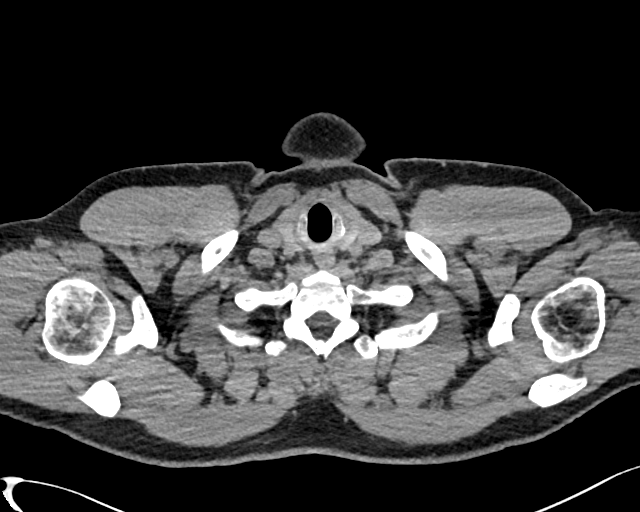
[im 161/175  lung]
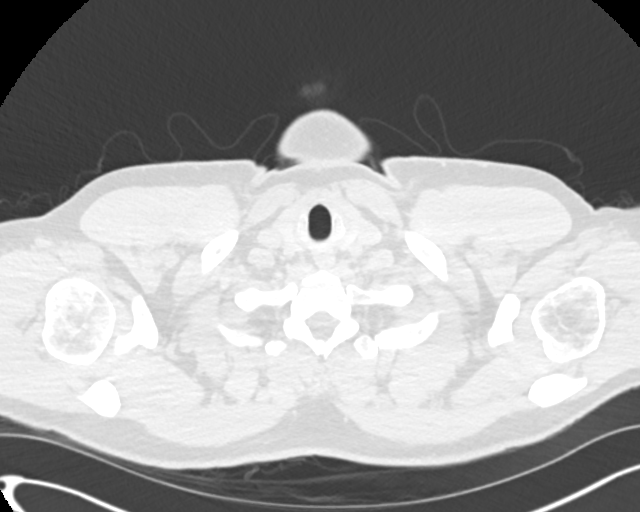

[Series 4: chest 2.00 br40 s3 cor · coronal · 0.68mm/px · 3 of 163 slices shown]
[im 33/163  lung]
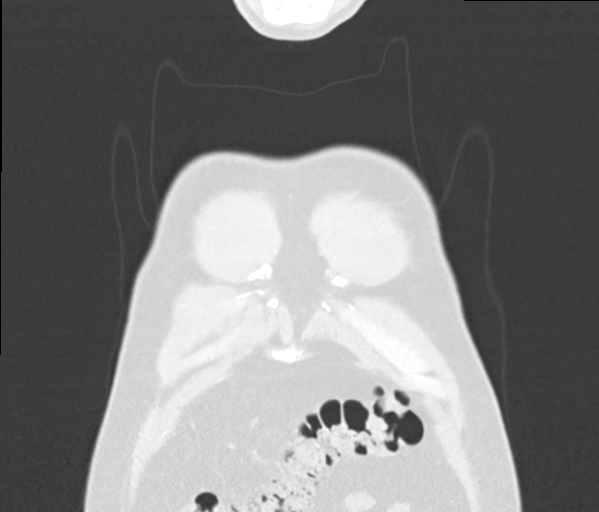
[im 65/163  lung]
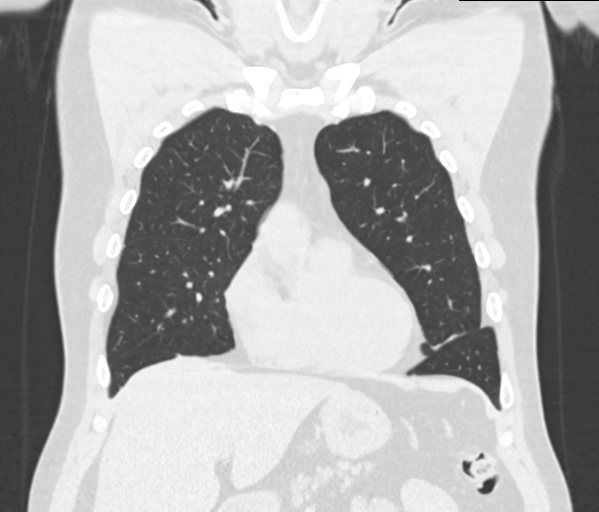
[im 98/163  lung]
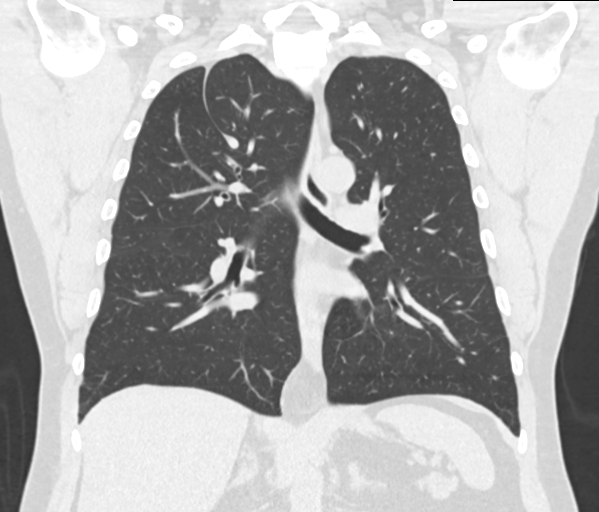

[12 of 36 positions shown; findings below may reference images not displayed]

FINDINGS: Cardiovascular: The heart is within upper limits of normal. No
pericardial effusion is seen. No coronary artery calcifications are
evident. The mid ascending thoracic aorta measures 40 mm in diameter
which does indicate fusiform dilatation. Recommend annual imaging
followup by CTA or MRA. This recommendation follows [2X]
ACCF/AHA/AATS/ACR/ASA/SCA/CALEB/CALEB/CALEB/CALEB Guidelines for the
Diagnosis and Management of Patients with Thoracic Aortic Disease.
Circulation. [2X]; 121: [id number].

Mediastinum/Nodes: There are calcified right hilar and mediastinal
lymph nodes present consistent with prior granulomatous disease. No
mediastinal adenopathy is seen. The thyroid gland is within normal
limits. There does appear to be a small hiatal hernia present.

Lungs/Pleura: Calcified granulomas are present with the right upper
lobe adjacent to the azygos lobe consistent with prior granulomatous
disease. Some linear scarring is present posteriorly in the left
lower lobe and within the lingula. No pneumonia or effusion is
currently seen.

Upper Abdomen: On images through the upper abdomen there does appear
to be a cyst emanating from the upper pole of the left kidney
measuring approximately 3.7 cm. No other abnormality seen on these
limited views through the upper abdomen.

Musculoskeletal: The thoracic vertebrae are in normal alignment with
mild degenerative change diffusely. No compression deformity is
seen. The sternum appears intact.
IMPRESSION: 1. Changes of prior granulomatous disease with calcified granulomas
in the right upper lobe, azygos lobe, and right hilum-mediastinum.
2. No suspicious lung nodule or mass. No pneumonia or pleural
effusion is seen.
3. Fusiform dilatation of the mid ascending thoracic aorta measuring
40 mm in diameter. Recommend annual imaging followup by CTA or MRA.
This recommendation follows [2X]
ACCF/AHA/AATS/ACR/ASA/SCA/CALEB/CALEB/CALEB/CALEB Guidelines for the
Diagnosis and Management of Patients with Thoracic Aortic Disease.
Circulation. [2X]; 121: [id number].
4. Suspect small hiatal hernia.

## 2018-11-24 ENCOUNTER — Telehealth: Payer: Self-pay | Admitting: Medical

## 2018-11-24 NOTE — Telephone Encounter (Signed)
Please call pt concerning recent xrays. He has some additional questions. Pt can be reached at [phone].

## 2018-11-25 NOTE — Telephone Encounter (Signed)
I called and spoke to patient about CT results.  Next steps: 1 - refer to gastroenterology soon for hiatal hernia, eval on cough/GERD, and colonoscopy 2 - refer to [facility] /Dr. Einar Gip for abnormal finding of aorta on CT chest  Send recent CT results, labs, last notes to specialist if not in Epic

## 2018-11-28 ENCOUNTER — Other Ambulatory Visit: Payer: Self-pay

## 2018-11-28 DIAGNOSIS — K219 Gastro-esophageal reflux disease without esophagitis: Secondary | ICD-10-CM

## 2018-11-28 NOTE — Telephone Encounter (Signed)
Referral for [facility] GI put in epic.

## 2018-11-29 ENCOUNTER — Ambulatory Visit: Payer: BLUE CROSS/BLUE SHIELD | Admitting: Medical

## 2018-12-01 DIAGNOSIS — F4322 Adjustment disorder with anxiety: Secondary | ICD-10-CM | POA: Diagnosis not present

## 2018-12-02 ENCOUNTER — Other Ambulatory Visit: Payer: Self-pay

## 2018-12-02 DIAGNOSIS — F17210 Nicotine dependence, cigarettes, uncomplicated: Secondary | ICD-10-CM

## 2018-12-02 DIAGNOSIS — R0789 Other chest pain: Secondary | ICD-10-CM

## 2018-12-02 DIAGNOSIS — R079 Chest pain, unspecified: Principal | ICD-10-CM

## 2018-12-02 DIAGNOSIS — G8929 Other chronic pain: Secondary | ICD-10-CM

## 2018-12-07 DIAGNOSIS — F4322 Adjustment disorder with anxiety: Secondary | ICD-10-CM | POA: Diagnosis not present

## 2018-12-09 ENCOUNTER — Encounter: Payer: Self-pay | Admitting: Gastroenterology

## 2018-12-15 ENCOUNTER — Encounter: Payer: Self-pay | Admitting: Internal Medicine

## 2018-12-15 ENCOUNTER — Ambulatory Visit: Payer: BLUE CROSS/BLUE SHIELD | Admitting: Internal Medicine

## 2018-12-15 VITALS — BP 120/78 | HR 70 | Ht 71.0 in | Wt 221.8 lb

## 2018-12-15 DIAGNOSIS — R0789 Other chest pain: Secondary | ICD-10-CM | POA: Diagnosis not present

## 2018-12-15 DIAGNOSIS — R739 Hyperglycemia, unspecified: Secondary | ICD-10-CM | POA: Diagnosis not present

## 2018-12-15 DIAGNOSIS — R030 Elevated blood-pressure reading, without diagnosis of hypertension: Secondary | ICD-10-CM

## 2018-12-15 DIAGNOSIS — F17210 Nicotine dependence, cigarettes, uncomplicated: Secondary | ICD-10-CM | POA: Diagnosis not present

## 2018-12-15 DIAGNOSIS — R05 Cough: Secondary | ICD-10-CM

## 2018-12-15 DIAGNOSIS — J45909 Unspecified asthma, uncomplicated: Secondary | ICD-10-CM

## 2018-12-15 DIAGNOSIS — I712 Thoracic aortic aneurysm, without rupture: Secondary | ICD-10-CM | POA: Diagnosis not present

## 2018-12-15 DIAGNOSIS — E7800 Pure hypercholesterolemia, unspecified: Secondary | ICD-10-CM | POA: Diagnosis not present

## 2018-12-15 DIAGNOSIS — R058 Other specified cough: Secondary | ICD-10-CM

## 2018-12-15 NOTE — Patient Instructions (Addendum)
  Pantoprazole (protonix) 40 mg   Take  30-60 min before first meal of the day and Pepcid (famotidine)  20 mg one @  bedtime until return to office - this is the best way to tell whether stomach acid is contributing to your problem.    Try BREO  Off for now   The key is to stop smoking completely before smoking completely stops you!  For smoking cessation classes call [phone]      Please schedule a follow up office visit in 4 weeks, sooner if needed with pfts on return and no BREO that day

## 2018-12-15 NOTE — Progress Notes (Signed)
Subjective:     Patient ID: Cody Tapia, male   DOB: [birth year]       MRN: [id number]    Brief patient profile:  52  yowm active smoker with variable chest tightness x at least since summer 2016 but more consistent x mid May 2017 made worse by smoking and eval by Cody Tapia with neg EKG/cxr/ spirometry and referred to pulmonary 04/30/2016     History of Present Illness  04/30/2016 1st [facility] office visit/ Cody Tapia   Chief Complaint  Patient presents with  . Pulmonary Consult    Referred by Cody Bode, PA. Pt c/o chest tightness for at least the past month.    since onset of worse chest tightness has been able to consistently able to work out including aerobics with mild  sob but no tightness while on ppi taking daily with protein shake.  Tightness can last all day long once it starts but usually take a cigarette to trigger it.  Throat clearing is variable, no cough on wakening/ does not disturb sleep nor does the tightness rec Pantoprazole (protonix) 40 mg   Take  30-60 min before first meal of the day and Pepcid (famotidine)  20 mg one @  bedtime  X one month and continue if better, if not call me GERD rx The key is to stop smoking completely before smoking completely stops you - it's really not too late!    12/15/2018  f/u ov/Cody Tapia re: [employer] daily due to chest tightness since 02/22/18 :helped some Chief Complaint  Patient presents with  . Follow-up    results of ct scan,   Dyspnea:  Able to work out fine 3 x weekly  Cough: none/ some throat clearing, chewing orbit gum Sleeping: fine  Bed flat SABA use: not needing 02: no    No obvious day to day or daytime variability or assoc excess/ purulent sputum or mucus plugs or hemoptysis or cp or chest tightness, subjective wheeze or overt sinus or hb symptoms.   Sleeps ok  without nocturnal  or early am exacerbation  of respiratory  c/o's or need for noct saba. Also denies any obvious fluctuation of symptoms with weather or  environmental changes or other aggravating or alleviating factors except as outlined above   No unusual exposure hx or h/o childhood pna/ asthma or knowledge of premature birth.  Current Allergies, Complete Past Medical History, Past Surgical History, Family History, and Social History were reviewed in [employer] record.  ROS  The following are not active complaints unless bolded Hoarseness, sore throat, dysphagia, dental problems, itching, sneezing,  nasal congestion or discharge of excess mucus or purulent secretions, ear ache,   fever, chills, sweats, unintended wt loss or wt gain, classically pleuritic or exertional cp,  orthopnea pnd or arm/hand swelling  or leg swelling, presyncope, palpitations, abdominal pain, anorexia, nausea, vomiting, diarrhea  or change in bowel habits or change in bladder habits, change in stools or change in urine, dysuria, hematuria,  rash, arthralgias, visual complaints, headache, numbness, weakness or ataxia or problems with walking or coordination,  change in mood or  memory.        Current Meds  Medication Sig  . albuterol (PROVENTIL HFA;VENTOLIN HFA) 108 (90 Base) MCG/ACT inhaler Inhale 2 puffs into the lungs every 6 (six) hours as needed for wheezing.  . fluticasone furoate-vilanterol (BREO ELLIPTA) 200-25 MCG/INH AEPB Inhale 1 puff into the lungs daily.  . Multiple Vitamin (MULTIVITAMIN) tablet Take 1 tablet by  mouth daily.  . pantoprazole (PROTONIX) 40 MG tablet Take 1 tablet (40 mg total) by mouth daily.  . tadalafil (CIALIS) 20 MG tablet Take 1 tablet (20 mg total) by mouth daily. as directed                      Objective:   Physical Exam   amb wm nad chewing mint gum  .  12/15/2018      221   04/30/16 220 lb (99.791 kg)  04/22/16 217 lb (98.431 kg)  12/09/15 211 lb (95.709 kg)    Vital signs reviewed - Note on arrival 02 sats  98% on RA    HEENT: nl dentition, turbinates bilaterally, and oropharynx. Nl  external ear canals without cough reflex   NECK :  without JVD/Nodes/TM/ nl carotid upstrokes bilaterally   LUNGS: no acc muscle use,  Nl contour chest which is clear to A and P bilaterally without cough on insp or exp maneuvers   CV:  RRR  no s3 or murmur or increase in P2, and no edema   ABD:  soft and nontender with nl inspiratory excursion in the supine position. No bruits or organomegaly appreciated, bowel sounds nl  MS:  Nl gait/ ext warm without deformities, calf tenderness, cyanosis or clubbing No obvious joint restrictions   SKIN: warm and dry without lesions    NEURO:  alert, approp, nl sensorium with  no motor or cerebellar deficits apparent.      I personally reviewed images and agree with radiology impression as follows:   Chest CT  11/18/18 1. Changes of prior granulomatous disease with calcified granulomas in the right upper lobe, azygos lobe, and right hilum-mediastinum. 2. No suspicious lung nodule or mass. No pneumonia or pleural effusion is seen. 3. 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

## 2018-12-16 ENCOUNTER — Encounter: Payer: Self-pay | Admitting: Internal Medicine

## 2018-12-16 DIAGNOSIS — F4322 Adjustment disorder with anxiety: Secondary | ICD-10-CM | POA: Diagnosis not present

## 2018-12-16 NOTE — Assessment & Plan Note (Signed)
4-5 min discussion re active cigarette smoking in addition to office E&M  Ask about tobacco use:   Ongoing but trying to quit Advise quitting   I took an extended  opportunity with this patient to outline the consequences of continued cigarette use  in airway disorders based on all the data we have from the multiple national lung health studies (perfomed over decades at millions of dollars in cost)  indicating that smoking cessation, not choice of inhalers or physicians, is the most important aspect of care.   Assess willingness:  Not fully committed at this point Assist in quit attempt:  Per PCP when ready Arrange follow up:   Follow up per Primary Care planned  For smoking cessation classes call [phone]    As to his LDSCT : informed him so far so good but if he really wants to reduce his risk of dying from lung cancer, stopping smoking statistically is superior to LDSCT but encouraged him to stay in the program until we have better data on how long after he quits he'll need yearly studies (some say 15 years which is a lot of studies that expose the pt to 10 x the rads of a nl cxr and he'll need f/u studies anyway of the TA)

## 2018-12-16 NOTE — Assessment & Plan Note (Signed)
Onset ? 2017 assoc with chest tightness Spirometry 04/22/16 no sign airflow obst, very minimal  bowing in f/v loop  - rec max gerd rx 04/30/2016  ? Response - Spirometry  02/22/18   FEV1 3.0 (73%)  Ratio 79 s curvature  > started breo which helped chest tightness did not worsen cough   Upper airway cough syndrome (previously labeled PNDS),  is so named because it's frequently impossible to sort out how much is  CR/sinusitis with freq throat clearing (which can be related to primary GERD)   vs  causing  secondary (" extra esophageal")  GERD from wide swings in gastric pressure that occur with throat clearing, often  promoting self use of mint and menthol lozenges that reduce the lower esophageal sphincter tone and exacerbate the problem further in a cyclical fashion.   These are the same pts (now being labeled as having "irritable larynx syndrome" by some cough centers) who not infrequently have a history of having failed to tolerate ace inhibitors,  dry powder inhalers or biphosphonates or report having atypical/extraesophageal reflux symptoms that don't respond to standard doses of PPI  and are easily confused as having aecopd or asthma flares by even experienced allergists/ pulmonologists (myself included).   At this point he is on BREO (less irrritating than advair for sure but still a dpi) and just started on acei today by cards due to TA and chewing mint gum, not using ppi as rec and ? Has dx of asthma/copd clinically now.   In other words. He's a "perfect storm" for UACS if this is indeed the dx and to sort though it rec  1) follow gerd diet and meds to the letter if possible 2) stop smoking now if at all possible (see separate a/p)  3) return off breo for full pfts and decide where  to go from there  - if chest tightness or cough recur off breo can just start back but hold it the day of the pfts   Discussed in detail all the  indications, usual  risks and alternatives  relative to the benefits  with patient who agrees to proceed with w/u as outlined.

## 2018-12-16 NOTE — Assessment & Plan Note (Signed)
Started on acei by cardiology today.  In event cough worsens,  Keep in mind ACE inhibitors are problematic in  pts with airway complaints because  even experienced pulmonologists can't  distinguish ace effects from copd/asthma.  By themselves they don't actually cause a problem, much like oxygen can't by itself start a fire, but they certainly serve as a powerful catalyst or enhancer for any "fire"  or inflammatory process in the upper airway, be it caused by an ET  tube or more commonly reflux (especially in the obese or pts with known GERD-as is the case here- or who are on biphoshonates).    In the era of ARB near equivalency until we have a better handle on the reversibility of the airway problem, I would have low threshold to change to ARB but pt not enthusiastic about taking any meds for bp so defer this issue entirely to PCP/ cards.   I had an extended discussion with the patient reviewing all relevant studies completed to date and  lasting 15 to 20 minutes of a 25 minute visit    Each maintenance medication was reviewed in detail including most importantly the difference between maintenance and prns and under what circumstances the prns are to be triggered using an action plan format that is not reflected in the computer generated alphabetically organized AVS.     Please see AVS for specific instructions unique to this visit that I personally wrote and verbalized to the the pt in detail and then reviewed with pt  by my nurse highlighting any  changes in therapy recommended at today's visit to their plan of care.

## 2018-12-17 ENCOUNTER — Other Ambulatory Visit: Payer: Self-pay | Admitting: Cardiology

## 2018-12-17 DIAGNOSIS — I200 Unstable angina: Secondary | ICD-10-CM

## 2018-12-21 DIAGNOSIS — F4322 Adjustment disorder with anxiety: Secondary | ICD-10-CM | POA: Diagnosis not present

## 2018-12-24 HISTORY — PX: COLONOSCOPY: SHX174

## 2018-12-30 DIAGNOSIS — F4322 Adjustment disorder with anxiety: Secondary | ICD-10-CM | POA: Diagnosis not present

## 2019-01-04 ENCOUNTER — Ambulatory Visit: Payer: BLUE CROSS/BLUE SHIELD | Admitting: Gastroenterology

## 2019-01-04 ENCOUNTER — Encounter: Payer: Self-pay | Admitting: Gastroenterology

## 2019-01-04 VITALS — BP 110/76 | HR 88 | Ht 71.0 in | Wt 208.0 lb

## 2019-01-04 DIAGNOSIS — Z1211 Encounter for screening for malignant neoplasm of colon: Secondary | ICD-10-CM

## 2019-01-04 DIAGNOSIS — K449 Diaphragmatic hernia without obstruction or gangrene: Secondary | ICD-10-CM | POA: Diagnosis not present

## 2019-01-04 DIAGNOSIS — K219 Gastro-esophageal reflux disease without esophagitis: Secondary | ICD-10-CM | POA: Diagnosis not present

## 2019-01-04 MED ORDER — NA SULFATE-K SULFATE-MG SULF 17.5-3.13-1.6 GM/177ML PO SOLN: 1.0000 | Freq: Once | ORAL | 0 refills | Status: AC

## 2019-01-04 NOTE — Patient Instructions (Signed)
If you are age 51 or older, your body mass index should be between 23-30. Your Body mass index is 29.01 kg/m. If this is out of the aforementioned range listed, please consider follow up with your Primary Care Provider.  If you are age 4 or younger, your body mass index should be between 19-25. Your Body mass index is 29.01 kg/m. If this is out of the aformentioned range listed, please consider follow up with your Primary Care Provider.   You have been scheduled for an endoscopy and colonoscopy. Please follow the written instructions given to you at your visit today. Please pick up your prep supplies at the pharmacy within the next 1-3 days. If you use inhalers (even only as needed), please bring them with you on the day of your procedure. Your physician has requested that you go to www.startemmi.com and enter the access code given to you at your visit today. This web site gives a general overview about your procedure. However, you should still follow specific instructions given to you by our office regarding your preparation for the procedure.  It was a pleasure to see you today!  Dr. Loletha Carrow

## 2019-01-04 NOTE — Progress Notes (Signed)
[facility] Consult Note:  History: Cody Tapia 01/04/2019  Referring physician: Carlena Hurl, PA-C  Reason for consult/chief complaint: Colon Cancer Screening (Patient has no compliants today, hx of colon polyps)   Subjective  HPI:  This is a very pleasant 53 year old man referred by primary care for chronic GERD symptoms.  I reviewed primary care office notes, with the patient is complained of chronic chest tightness and wheezing, limiting his activities and causing frustration.  He has been seen in cardiology consult, and also recently in pulmonary consult.  At that time, nightly H2 blocker was added to his daily PPI.  Unfortunately he continues to smoke cigarettes.  He reports upper endoscopy and colonoscopy about 10 years ago at outside institution, done for left upper quadrant abdominal pain, which now rarely bothers him.  His reflux symptoms have improved with recent changes in diet, cessation of alcohol intake, purposeful weight loss and attempt to quit smoking.  Cody Tapia denies dysphagia, odynophagia, nausea, vomiting or early satiety.  No rectal bleeding.  His longstanding bowel pattern is typically a formed stool in the morning, and then may be 1 or 2 semi-formed to loose stools later in the day.  ROS:  Review of Systems  Constitutional: Positive for fatigue. Negative for appetite change and unexpected weight change.  HENT: Negative for mouth sores and voice change.   Eyes: Negative for pain and redness.  Respiratory: Negative for cough and shortness of breath.   Cardiovascular: Negative for chest pain and palpitations.  Genitourinary: Negative for dysuria and hematuria.  Musculoskeletal: Negative for arthralgias and myalgias.  Skin: Negative for pallor and rash.  Neurological: Negative for weakness and headaches.  Hematological: Negative for adenopathy.     Past Medical History: Past Medical History:  Diagnosis Date  . 23-polyvalent  pneumococcal polysaccharide vaccine declined 7/14  . Anxiety    prior therapy, in remission  . ED (erectile dysfunction)   . GERD (gastroesophageal reflux disease)   . Tobacco use   . Wears glasses      Past Surgical History: Past Surgical History:  Procedure Laterality Date  . COLONOSCOPY  2009   [facility] for abdominal pain  . ESOPHAGOGASTRODUODENOSCOPY  2009   [facility], abdominal pain  . EYE SURGERY     strabismus surgery, right  . WISDOM TOOTH EXTRACTION       Family History: Family History  Problem Relation Age of Onset  . Heart disease Father 35       stent, CAD  . COPD Mother   . Sudden death Neg Hx   . Heart attack Neg Hx   . Hyperlipidemia Neg Hx   . Diabetes Neg Hx   . Hypertension Neg Hx   . Cancer Neg Hx   . Stroke Neg Hx     Social History: Social History   Socioeconomic History  . Marital status: Single    Spouse name: Not on file  . Number of children: Not on file  . Years of education: Not on file  . Highest education level: Not on file  Occupational History  . Not on file  Social Needs  . Financial resource strain: Not on file  . Food insecurity:    Worry: Not on file    Inability: Not on file  . Transportation needs:    Medical: Not on file    Non-medical: Not on file  Tobacco Use  . Smoking status: Current Some Day Smoker    Packs/day: 0.50  Years: 30.00    Pack years: 15.00    Types: Cigarettes  . Smokeless tobacco: Never Used  Substance and Sexual Activity  . Alcohol use: Yes    Alcohol/week: 3.0 standard drinks    Types: 3 Glasses of wine per week  . Drug use: No  . Sexual activity: Not on file  Lifestyle  . Physical activity:    Days per week: Not on file    Minutes per session: Not on file  . Stress: Not on file  Relationships  . Social connections:    Talks on phone: Not on file    Gets together: Not on file    Attends religious service: Not on file    Active member of club or organization: Not on  file    Attends meetings of clubs or organizations: Not on file    Relationship status: Not on file  Other Topics Concern  . Not on file  Social History Narrative   Girlfriend, domestic partner, teenage daughter.   Exercise - cardio, elliptical, power walking, free weights 3 days per week.  Working out with Physiological scientist.  Mortgage lending x 20 years.      Allergies: Allergies  Allergen Reactions  . Chantix [Varenicline Tartrate]   . Wellbutrin [Bupropion]     Sleepwalking, intolerance    Outpatient Meds: Current Outpatient Medications  Medication Sig Dispense Refill  . albuterol (PROVENTIL HFA;VENTOLIN HFA) 108 (90 Base) MCG/ACT inhaler Inhale 2 puffs into the lungs every 6 (six) hours as needed for wheezing. 1 Inhaler 1  . fluticasone furoate-vilanterol (BREO ELLIPTA) 200-25 MCG/INH AEPB Inhale 1 puff into the lungs daily. 28 each 11  . irbesartan (AVAPRO) 150 MG tablet Take 150 mg by mouth daily.    . Multiple Vitamin (MULTIVITAMIN) tablet Take 1 tablet by mouth daily.    . pantoprazole (PROTONIX) 40 MG tablet Take 1 tablet (40 mg total) by mouth daily. 90 tablet 3  . tadalafil (CIALIS) 20 MG tablet Take 1 tablet (20 mg total) by mouth daily. as directed 10 tablet 11   No current facility-administered medications for this visit.       ___________________________________________________________________ Objective   Exam:  BP 110/76   Pulse 88   Ht 5\' 11"  (1.803 m)   Wt 208 lb (94.3 kg)   BMI 29.01 kg/m    General: Well-appearing man, normal vocal quality  Eyes: sclera anicteric, no redness  ENT: oral mucosa moist without lesions, no cervical or supraclavicular lymphadenopathy  CV: RRR without murmur, S1/S2, no JVD, no peripheral edema  Resp: clear to auscultation bilaterally, normal RR and effort noted  GI: soft, no tenderness, with active bowel sounds. No guarding or palpable organomegaly noted.  Skin; warm and dry, no rash or jaundice noted  Neuro:  awake, alert and oriented x 3. Normal gross motor function and fluent speech  Labs:  CBC Latest Ref Rng & Units 03/07/2018 01/14/2017 11/07/2015  WBC 3.4 - 10.8 x10E3/uL 7.5 7.2 8.4  Hemoglobin 13.0 - 17.7 g/dL 14.8 15.5 15.1  Hematocrit 37.5 - 51.0 % 44.1 45.7 44.9  Platelets 150 - 379 x10E3/uL 285 283 261     Radiologic Studies:  CT scan chest 11/18/2018: " IMPRESSION: 1. Changes of prior granulomatous disease with calcified granulomas in the right upper lobe, azygos lobe, and right hilum-mediastinum. 2. No suspicious lung nodule or mass. No pneumonia or pleural effusion is seen. 3. Fusiform dilatation of the mid ascending thoracic aorta measuring 40 mm in diameter. Recommend annual  imaging followup by CTA or MRA. This recommendation follows 2010 ACCF/AHA/AATS/ACR/ASA/SCA/SCAI/SIR/STS/SVM Guidelines for the Diagnosis and Management of Patients with Thoracic Aortic Disease. Circulation. 2010; 121: Q333-L456. 4. Suspect small hiatal hernia"   Assessment: Encounter Diagnoses  Name Primary?  . Gastroesophageal reflux disease, esophagitis presence not specified Yes  . Hiatal hernia   . Special screening for malignant neoplasms, colon     Longstanding GERD symptoms and smoker.  Naturally, we discussed importance of smoking cessation, diet and lifestyle changes to control reflux symptoms.  He does not have red flag symptoms, but should have upper endoscopy because of his increased risk for Barrett's esophagus.  Hiatal hernia possibly contributing to reflux symptoms.  Screening colonoscopy as well.  He is agreeable after discussion of procedure and risks.  The benefits and risks of the planned procedure were described in detail with the patient or (when appropriate) their health care proxy.  Risks were outlined as including, but not limited to, bleeding, infection, perforation, adverse medication reaction leading to cardiac or pulmonary decompensation, or pancreatitis (if ERCP).   The limitation of incomplete mucosal visualization was also discussed.  No guarantees or warranties were given.   Thank you for the courtesy of this consult.  Please call me with any questions or concerns.  Nelida Meuse III  CC: Referring provider noted above

## 2019-01-05 ENCOUNTER — Encounter: Payer: Self-pay | Admitting: Gastroenterology

## 2019-01-06 ENCOUNTER — Other Ambulatory Visit: Payer: BLUE CROSS/BLUE SHIELD

## 2019-01-06 DIAGNOSIS — R0789 Other chest pain: Secondary | ICD-10-CM | POA: Diagnosis not present

## 2019-01-09 ENCOUNTER — Encounter: Payer: Self-pay | Admitting: Internal Medicine

## 2019-01-09 ENCOUNTER — Ambulatory Visit (INDEPENDENT_AMBULATORY_CARE_PROVIDER_SITE_OTHER): Payer: BLUE CROSS/BLUE SHIELD | Admitting: Internal Medicine

## 2019-01-09 ENCOUNTER — Other Ambulatory Visit: Payer: Self-pay | Admitting: Cardiology

## 2019-01-09 ENCOUNTER — Ambulatory Visit: Payer: BLUE CROSS/BLUE SHIELD | Admitting: Internal Medicine

## 2019-01-09 VITALS — BP 102/70 | HR 69 | Ht 71.0 in | Wt 209.0 lb

## 2019-01-09 DIAGNOSIS — J45909 Unspecified asthma, uncomplicated: Secondary | ICD-10-CM | POA: Diagnosis not present

## 2019-01-09 DIAGNOSIS — R05 Cough: Secondary | ICD-10-CM | POA: Diagnosis not present

## 2019-01-09 DIAGNOSIS — R0789 Other chest pain: Secondary | ICD-10-CM

## 2019-01-09 DIAGNOSIS — R058 Other specified cough: Secondary | ICD-10-CM

## 2019-01-09 DIAGNOSIS — F17210 Nicotine dependence, cigarettes, uncomplicated: Secondary | ICD-10-CM | POA: Diagnosis not present

## 2019-01-09 LAB — PULMONARY FUNCTION TEST
DL/VA % pred: 107 %
DL/VA: 4.75 ml/min/mmHg/L
DLCO UNC: 27.31 ml/min/mmHg
DLCO unc % pred: 90 %
FEF 25-75 Post: 2.3 L/sec
FEF 25-75 Pre: 2.41 L/sec
FEF2575-%Change-Post: -4 %
FEF2575-%Pred-Post: 64 %
FEF2575-%Pred-Pre: 67 %
FEV1-%Change-Post: 0 %
FEV1-%PRED-PRE: 72 %
FEV1-%Pred-Post: 71 %
FEV1-Post: 2.9 L
FEV1-Pre: 2.93 L
FEV1FVC-%Change-Post: 0 %
FEV1FVC-%Pred-Pre: 96 %
FEV6-%Change-Post: 0 %
FEV6-%Pred-Post: 76 %
FEV6-%Pred-Pre: 76 %
FEV6-Post: 3.87 L
FEV6-Pre: 3.85 L
FEV6FVC-%CHANGE-POST: 0 %
FEV6FVC-%Pred-Post: 103 %
FEV6FVC-%Pred-Pre: 103 %
FVC-%Change-Post: 0 %
FVC-%Pred-Post: 74 %
FVC-%Pred-Pre: 74 %
FVC-Post: 3.87 L
FVC-Pre: 3.9 L
Post FEV1/FVC ratio: 75 %
Post FEV6/FVC ratio: 100 %
Pre FEV1/FVC ratio: 75 %
Pre FEV6/FVC Ratio: 100 %
RV % pred: 141 %
RV: 2.96 L
TLC % pred: 98 %
TLC: 7.05 L

## 2019-01-09 NOTE — Patient Instructions (Signed)
Continue the reflux treatment for another 2 months after that defer to PCP or GI problems  Not smoking is key  If the throat clearing is still bothering you options are   1) gabapentin 100 mg three times a day and adjusted up to a max 300 mg three times  2) see Dr Eliott Nine / voice center (call for referral)    If you are satisfied with your treatment plan,  let your doctor know and he/she can either refill your medications or you can return here when your prescription runs out.     If in any way you are not 100% satisfied,  please tell us.  If 100% better, tell your friends!  Pulmonary follow up is as needed

## 2019-01-09 NOTE — Progress Notes (Signed)
Full PFT performed today. °

## 2019-01-09 NOTE — Progress Notes (Signed)
Subjective:     Patient ID: Cody Tapia, male   DOB: 04-07-1966       MRN: [id number]    Brief patient profile:  52  yowm active smoker with variable chest tightness x at least since summer 2016 but more consistent x mid May 2017 made worse by smoking and eval by [facility] with neg EKG/cxr/ spirometry and referred to pulmonary 04/30/2016     History of Present Illness  04/30/2016 1st [facility] office visit/ [name]   Chief Complaint  Patient presents with  . Pulmonary Consult    Referred by Chana Bode, PA. Pt c/o chest tightness for at least the past month.    since onset of worse chest tightness has been able to consistently able to work out including aerobics with mild  sob but no tightness while on ppi taking daily with protein shake.  Tightness can last all day long once it starts but usually take a cigarette to trigger it.  Throat clearing is variable, no cough on wakening/ does not disturb sleep nor does the tightness rec Pantoprazole (protonix) 40 mg   Take  30-60 min before first meal of the day and Pepcid (famotidine)  20 mg one @  bedtime  X one month and continue if better, if not call me GERD rx The key is to stop smoking completely before smoking completely stops you - it's really not too late!    12/15/2018  f/u ov/[name] re: [employer] daily due to chest tightness since 02/22/18 :helped some Chief Complaint  Patient presents with  . Follow-up    results of ct scan,   Dyspnea:  Able to work out fine 3 x weekly  Cough: none/ some throat clearing, chewing orbit gum Sleeping: fine  Bed flat SABA use: not needing rec Pantoprazole (protonix) 40 mg   Take  30-60 min before first meal of the day and Pepcid (famotidine)  20 mg one @  bedtime until return to office - this is the best way to tell whether stomach acid is contributing to your problem.   Try BREO  Off for now  The key is to stop smoking completely before smoking completely stops you! For smoking cessation  classes call [phone]   Please schedule a follow up office visit in 4 weeks, sooner if needed with pfts on return and no BREO that day     01/09/2019  f/u ov/[name] re: chest tightness better on ppi/ diet  Chief Complaint  Patient presents with  . Follow-up    PFT's done today. Breathing is doing well and no co's. He rarely uses his albuterol inhaler.   Dyspnea:  Not limited by breathing from desired activities  / does some aerobics s tightness in chest any more  Cough: freq thoat clearing he attributes to smoking  - worse the more he smokes Sleeping: ok flat / one pillow  SABA use: none     No obvious day to day or daytime variability or assoc excess/ purulent sputum or mucus plugs or hemoptysis or cp or chest tightness, subjective wheeze or overt sinus or hb symptoms.   Sleeping as above  without nocturnal  or early am exacerbation  of respiratory  c/o's or need for noct saba. Also denies any obvious fluctuation of symptoms with weather or environmental changes or other aggravating or alleviating factors except as outlined above   No unusual exposure hx or h/o childhood pna/ asthma or knowledge of premature birth.  Current Allergies, Complete Past  Medical History, Past Surgical History, Family History, and Social History were reviewed in [employer] record.  ROS  The following are not active complaints unless bolded Hoarseness, sore throat, dysphagia, dental problems, itching, sneezing,  nasal congestion or discharge of excess mucus or purulent secretions, ear ache,   fever, chills, sweats, unintended wt loss or wt gain, classically pleuritic or exertional cp,  orthopnea pnd or arm/hand swelling  or leg swelling, presyncope, palpitations, abdominal pain, anorexia, nausea, vomiting, diarrhea  or change in bowel habits or change in bladder habits, change in stools or change in urine, dysuria, hematuria,  rash, arthralgias, visual complaints, headache, numbness,  weakness or ataxia or problems with walking or coordination,  change in mood or  memory.        Current Meds  Medication Sig  . albuterol (PROVENTIL HFA;VENTOLIN HFA) 108 (90 Base) MCG/ACT inhaler Inhale 2 puffs into the lungs every 6 (six) hours as needed for wheezing.  . fluticasone furoate-vilanterol (BREO ELLIPTA) 200-25 MCG/INH AEPB Inhale 1 puff into the lungs daily.  . irbesartan (AVAPRO) 150 MG tablet Take 150 mg by mouth daily.  . Multiple Vitamin (MULTIVITAMIN) tablet Take 1 tablet by mouth daily.  . pantoprazole (PROTONIX) 40 MG tablet Take 1 tablet (40 mg total) by mouth daily.  . tadalafil (CIALIS) 20 MG tablet Take 1 tablet (20 mg total) by mouth daily. as directed            Objective:   Physical Exam   amb wm / freq throat clearing  01/09/2019       209   12/15/2018      221   04/30/16 220 lb (99.791 kg)  04/22/16 217 lb (98.431 kg)  12/09/15 211 lb (95.709 kg)    Vital signs reviewed - Note on arrival 02 sats  95% on RA     HEENT: nl dentition, turbinates bilaterally, and oropharynx. Nl external ear canals without cough reflex   NECK :  without JVD/Nodes/TM/ nl carotid upstrokes bilaterally   LUNGS: no acc muscle use,  Nl contour chest which is clear to A and P bilaterally without cough on insp or exp maneuvers   CV:  RRR  no s3 or murmur or increase in P2, and no edema   ABD:  soft and nontender with nl inspiratory excursion in the supine position. No bruits or organomegaly appreciated, bowel sounds nl  MS:  Nl gait/ ext warm without deformities, calf tenderness, cyanosis or clubbing No obvious joint restrictions   SKIN: warm and dry without lesions    NEURO:  alert, approp, nl sensorium with  no motor or cerebellar deficits apparent.                   Assessment:

## 2019-01-10 ENCOUNTER — Encounter: Payer: Self-pay | Admitting: Internal Medicine

## 2019-01-10 DIAGNOSIS — F4322 Adjustment disorder with anxiety: Secondary | ICD-10-CM | POA: Diagnosis not present

## 2019-01-10 NOTE — Assessment & Plan Note (Addendum)
Spirometry 04/22/16 s sign airflow obst confirmed with completely nl pfts 01/09/2019   Counseled re importance of smoking cessation but did not meet time criteria for separate billing   I reviewed the Fletcher curve with the patient that basically indicates  if you quit smoking when your best day FEV1 is still well preserved (as is clearly  the case here)  it is highly unlikely you will progress to severe disease and informed the patient there was  no medication on the market that has proven to alter the curve/ its downward trajectory  or the likelihood of progression of their disease(unlike other chronic medical conditions such as atheroclerosis where we do think we can change the natural hx with risk reducing meds)    Therefore stopping smoking and maintaining abstinence are  the most important aspects of care, not choice of inhalers or for that matter, doctors.   Treatment other than smoking cessation  is entirely directed by severity of symptoms and focused also on reducing exacerbations, not attempting to change the natural history of the disease.   >>>  Since no chronic symptoms and no tendency to aecopd, no rx or pulmonary f/u needed at this point   I had an extended summary discussion with the patient reviewing all relevant studies completed to date and  lasting 15 to 20 minutes of a 25 minute visit    Each maintenance medication was reviewed in detail including most importantly the difference between maintenance and prns and under what circumstances the prns are to be triggered using an action plan format that is not reflected in the computer generated alphabetically organized AVS.     Please see AVS for specific instructions unique to this visit that I personally wrote and verbalized to the the pt in detail and then reviewed with pt  by my nurse highlighting any  changes in therapy recommended at today's visit to their plan of care.

## 2019-01-10 NOTE — Assessment & Plan Note (Addendum)
Onset ? 2017 assoc with chest tightness/ active smoker Spirometry 04/22/16 no sign airflow obst, very minimal  bowing in f/v loop  - rec max gerd rx 04/30/2016  ? Response - Spirometry  02/22/18   FEV1 3.0 (73%)  Ratio 79 s curvature  > started breo which helped chest tightness did not worsen cough  - 01/09/2019 ov: cough improved on gerd rx/ consider either gabapentin trial (here prn) or referral to [facility] center/ Dr Joya Gaskins p GI eval done if the throat clearing persists   - PFT's  - 01/09/2019  FEV1 2.93  (72% ) ratio 0.75  p no % improvement from saba p nothing  prior to study with DLCO  90 % corrects to 107  % for alv volume  And  Very minimal  curvature    His symptoms have improved with gerd rx and he has no significant copd at this point either by pfts or by ct (no emphysema evolving) so rec d/c cigs if possible and continue gerd rx subject to f/u by GI planned.

## 2019-01-16 ENCOUNTER — Other Ambulatory Visit: Payer: Self-pay | Admitting: Cardiology

## 2019-01-16 DIAGNOSIS — R0789 Other chest pain: Secondary | ICD-10-CM | POA: Diagnosis not present

## 2019-01-17 ENCOUNTER — Ambulatory Visit: Payer: BLUE CROSS/BLUE SHIELD

## 2019-01-17 DIAGNOSIS — I200 Unstable angina: Secondary | ICD-10-CM

## 2019-01-17 LAB — BASIC METABOLIC PANEL
BUN/Creatinine Ratio: 18 (ref 9–20)
BUN: 20 mg/dL (ref 6–24)
CO2: 23 mmol/L (ref 20–29)
CREATININE: 1.11 mg/dL (ref 0.76–1.27)
Calcium: 9.3 mg/dL (ref 8.7–10.2)
Chloride: 104 mmol/L (ref 96–106)
GFR calc Af Amer: 89 mL/min/{1.73_m2} (ref >59)
GFR calc non Af Amer: 77 mL/min/{1.73_m2} (ref >59)
GLUCOSE: 110 mg/dL — AB (ref 65–99)
Potassium: 4.5 mmol/L (ref 3.5–5.2)
SODIUM: 139 mmol/L (ref 134–144)

## 2019-01-18 ENCOUNTER — Ambulatory Visit (AMBULATORY_SURGERY_CENTER): Payer: BLUE CROSS/BLUE SHIELD | Admitting: Gastroenterology

## 2019-01-18 ENCOUNTER — Encounter: Payer: Self-pay | Admitting: Gastroenterology

## 2019-01-18 VITALS — BP 101/59 | HR 66 | Temp 98.6°F | Resp 18 | Ht 71.0 in | Wt 209.0 lb

## 2019-01-18 DIAGNOSIS — K219 Gastro-esophageal reflux disease without esophagitis: Secondary | ICD-10-CM

## 2019-01-18 DIAGNOSIS — D125 Benign neoplasm of sigmoid colon: Secondary | ICD-10-CM | POA: Diagnosis not present

## 2019-01-18 DIAGNOSIS — D122 Benign neoplasm of ascending colon: Secondary | ICD-10-CM

## 2019-01-18 DIAGNOSIS — K449 Diaphragmatic hernia without obstruction or gangrene: Secondary | ICD-10-CM | POA: Diagnosis not present

## 2019-01-18 DIAGNOSIS — Z1211 Encounter for screening for malignant neoplasm of colon: Secondary | ICD-10-CM | POA: Diagnosis not present

## 2019-01-18 MED ORDER — SODIUM CHLORIDE 0.9 % IV SOLN: 500.0000 mL | Freq: Once | INTRAVENOUS | Status: DC

## 2019-01-18 NOTE — Op Note (Signed)
[facility] Patient Name: Cody Tapia Procedure Date: 01/18/2019 2:27 PM MRN: [id number] Endoscopist: Mallie Mussel L. Loletha Carrow , MD Age: 53 Referring MD:  Date of Birth: [birth year] Gender: Male Account #: [id number] Procedure:                Colonoscopy Indications:              Screening for colorectal malignant neoplasm, This                            is the patient's first colonoscopy Medicines:                Monitored Anesthesia Care Procedure:                Pre-Anesthesia Assessment:                           - Prior to the procedure, a History and Physical                            was performed, and patient medications and                            allergies were reviewed. The patient's tolerance of                            previous anesthesia was also reviewed. The risks                            and benefits of the procedure and the sedation                            options and risks were discussed with the patient.                            All questions were answered, and informed consent                            was obtained. Prior Anticoagulants: The patient has                            taken no previous anticoagulant or antiplatelet                            agents. ASA Grade Assessment: II - A patient with                            mild systemic disease. After reviewing the risks                            and benefits, the patient was deemed in                            satisfactory condition to undergo the procedure.  After obtaining informed consent, the colonoscope                            was passed under direct vision. Throughout the                            procedure, the patient's blood pressure, pulse, and                            oxygen saturations were monitored continuously. The                            Colonoscope was introduced through the anus and                            advanced to the the cecum,  identified by                            appendiceal orifice and ileocecal valve. The                            colonoscopy was performed without difficulty. The                            patient tolerated the procedure well. The quality                            of the bowel preparation was excellent. The                            ileocecal valve, appendiceal orifice, and rectum                            were photographed. Scope In: 2:43:12 PM Scope Out: 2:55:54 PM Scope Withdrawal Time: 0 hours 10 minutes 7 seconds  Total Procedure Duration: 0 hours 12 minutes 42 seconds  Findings:                 The perianal and digital rectal examinations were                            normal.                           Multiple diverticula were found in the left colon.                           Three sessile polyps were found in the distal                            sigmoid colon (2) and ascending colon (1). The                            polyps were diminutive in size. These polyps were  removed with a cold biopsy forceps. Resection and                            retrieval were complete.                           The exam was otherwise without abnormality on                            direct and retroflexion views. Complications:            No immediate complications. Estimated Blood Loss:     Estimated blood loss was minimal. Impression:               - Diverticulosis in the left colon.                           - Three diminutive polyps in the distal sigmoid                            colon and in the ascending colon, removed with a                            cold biopsy forceps. Resected and retrieved.                           - The examination was otherwise normal on direct                            and retroflexion views. Recommendation:           - Patient has a contact number available for                            emergencies. The signs and symptoms of  potential                            delayed complications were discussed with the                            patient. Return to normal activities tomorrow.                            Written discharge instructions were provided to the                            patient.                           - Resume previous diet.                           - Continue present medications.                           - Await pathology results.                           -  Repeat colonoscopy is recommended for                            surveillance. The colonoscopy date will be                            determined after pathology results from today's                            exam become available for review. Henry L. Loletha Carrow, MD 01/18/2019 3:09:57 PM This report has been signed electronically.

## 2019-01-18 NOTE — Progress Notes (Signed)
PT taken to PACU. Monitors in place. VSS. Report given to RN. 

## 2019-01-18 NOTE — Op Note (Signed)
[facility] Patient Name: Cody Tapia Procedure Date: 01/18/2019 2:26 PM MRN: [id number] Endoscopist: Mallie Mussel L. Loletha Carrow , MD Age: 53 Referring MD:  Date of Birth: 01-03-1966 Gender: Male Account #: [id number] Procedure:                Upper GI endoscopy Indications:              Esophageal reflux symptoms that persist despite                            appropriate therapy, Screening for Barrett's                            esophagus in patient at risk for this condition Medicines:                Monitored Anesthesia Care Procedure:                Pre-Anesthesia Assessment:                           - Prior to the procedure, a History and Physical                            was performed, and patient medications and                            allergies were reviewed. The patient's tolerance of                            previous anesthesia was also reviewed. The risks                            and benefits of the procedure and the sedation                            options and risks were discussed with the patient.                            All questions were answered, and informed consent                            was obtained. Prior Anticoagulants: The patient has                            taken no previous anticoagulant or antiplatelet                            agents. ASA Grade Assessment: II - A patient with                            mild systemic disease. After reviewing the risks                            and benefits, the patient was deemed in  satisfactory condition to undergo the procedure.                           After obtaining informed consent, the endoscope was                            passed under direct vision. Throughout the                            procedure, the patient's blood pressure, pulse, and                            oxygen saturations were monitored continuously. The                            Endoscope was  introduced through the mouth, and                            advanced to the second part of duodenum. The upper                            GI endoscopy was accomplished without difficulty.                            The patient tolerated the procedure well. Scope In: Scope Out: Findings:                 The larynx was normal.                           There is no endoscopic evidence of Barrett's                            esophagus or esophagitis in the entire esophagus.                           A 2 cm hiatal hernia was present.                           The exam of the stomach was otherwise normal.                           The cardia and gastric fundus were normal on                            retroflexion.                           A single umbilicated lesion measuring 6 mm in                            diameter was found in the duodenal bulb.                           The exam of the duodenum was otherwise normal. Complications:  No immediate complications. Estimated Blood Loss:     Estimated blood loss: none. Impression:               - 2 cm hiatal hernia.                           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 Benign finding.                           - No specimens collected. Recommendation:           - Patient has a contact number available for                            emergencies. The signs and symptoms of potential                            delayed complications were discussed with the                            patient. Return to normal activities tomorrow.                            Written discharge instructions were provided to the                            patient.                           - Resume previous diet.                           - Continue present medications.                           - Discontinue the use of any products containing                             nicotine.                           - Follow an antireflux regimen indefinitely.                           - See the other procedure note for documentation of                            additional recommendations. Cody Tapia L. Loletha Carrow, MD 01/18/2019 3:16:19 PM This report has been signed electronically.

## 2019-01-18 NOTE — Patient Instructions (Signed)
Polyps, Hiatal Hernia, and Diverticulum handouts given  YOU HAD AN ENDOSCOPIC PROCEDURE TODAY AT [facility]:   Refer to the procedure report that was given to you for any specific questions about what was found during the examination.  If the procedure report does not answer your questions, please call your gastroenterologist to clarify.  If you requested that your care partner not be given the details of your procedure findings, then the procedure report has been included in a sealed envelope for you to review at your convenience later.  YOU SHOULD EXPECT: Some feelings of bloating in the abdomen. Passage of more gas than usual.  Walking can help get rid of the air that was put into your GI tract during the procedure and reduce the bloating. If you had a lower endoscopy (such as a colonoscopy or flexible sigmoidoscopy) you may notice spotting of blood in your stool or on the toilet paper. If you underwent a bowel prep for your procedure, you may not have a normal bowel movement for a few days.  Please Note:  You might notice some irritation and congestion in your nose or some drainage.  This is from the oxygen used during your procedure.  There is no need for concern and it should clear up in a day or so.  SYMPTOMS TO REPORT IMMEDIATELY:   Following lower endoscopy (colonoscopy or flexible sigmoidoscopy):  Excessive amounts of blood in the stool  Significant tenderness or worsening of abdominal pains  Swelling of the abdomen that is new, acute  Fever of 100F or higher   Following upper endoscopy (EGD)  Vomiting of blood or coffee ground material  New chest pain or pain under the shoulder blades  Painful or persistently difficult swallowing  New shortness of breath  Fever of 100F or higher  Black, tarry-looking stools  For urgent or emergent issues, a gastroenterologist can be reached at any hour by calling [phone].   DIET:  We do recommend a small meal at  first, but then you may proceed to your regular diet.  Drink plenty of fluids but you should avoid alcoholic beverages for 24 hours.  ACTIVITY:  You should plan to take it easy for the rest of today and you should NOT DRIVE or use heavy machinery until tomorrow (because of the sedation medicines used during the test).    FOLLOW UP: Our staff will call the number listed on your records the next business day following your procedure to check on you and address any questions or concerns that you may have regarding the information given to you following your procedure. If we do not reach you, we will leave a message.  However, if you are feeling well and you are not experiencing any problems, there is no need to return our call.  We will assume that you have returned to your regular daily activities without incident.  If any biopsies were taken you will be contacted by phone or by letter within the next 1-3 weeks.  Please call us at [phone] if you have not heard about the biopsies in 3 weeks.    SIGNATURES/CONFIDENTIALITY: You and/or your care partner have signed paperwork which will be entered into your electronic medical record.  These signatures attest to the fact that that the information above on your After Visit Summary has been reviewed and is understood.  Full responsibility of the confidentiality of this discharge information lies with you and/or your care-partner.

## 2019-01-19 ENCOUNTER — Telehealth: Payer: Self-pay | Admitting: *Deleted

## 2019-01-19 ENCOUNTER — Telehealth: Payer: Self-pay

## 2019-01-19 NOTE — Telephone Encounter (Signed)
Left message on f/u call 

## 2019-01-19 NOTE — Telephone Encounter (Signed)
No answer, message left for the patient. 

## 2019-01-24 ENCOUNTER — Encounter: Payer: Self-pay | Admitting: Cardiology

## 2019-01-24 ENCOUNTER — Encounter: Payer: Self-pay | Admitting: Gastroenterology

## 2019-01-24 ENCOUNTER — Ambulatory Visit: Payer: BLUE CROSS/BLUE SHIELD | Admitting: Cardiology

## 2019-01-24 ENCOUNTER — Encounter: Payer: Self-pay | Admitting: Medical

## 2019-01-24 VITALS — BP 144/87 | HR 76 | Ht 71.0 in | Wt 209.0 lb

## 2019-01-24 DIAGNOSIS — E7800 Pure hypercholesterolemia, unspecified: Secondary | ICD-10-CM

## 2019-01-24 DIAGNOSIS — I712 Thoracic aortic aneurysm, without rupture, unspecified: Secondary | ICD-10-CM

## 2019-01-24 DIAGNOSIS — I10 Essential (primary) hypertension: Secondary | ICD-10-CM

## 2019-01-24 DIAGNOSIS — Z720 Tobacco use: Secondary | ICD-10-CM | POA: Diagnosis not present

## 2019-01-24 MED ORDER — IRBESARTAN 300 MG PO TABS: 300.0000 mg | ORAL_TABLET | Freq: Every day | ORAL | 3 refills | Status: DC

## 2019-01-24 NOTE — Progress Notes (Signed)
Subjective:  Primary Physician:  Carlena Hurl, PA-C  Patient ID: Cody Tapia, male    DOB: Feb 21, 1966, 53 y.o.   MRN: [id number]  Chief Complaint  Patient presents with  . Hypertension    F/U after testing    HPI: Cody Tapia  is a 53 y.o. male  with chronic tobacco use, mild obesity, hyperlipidemia and erectile dysfunction who presents to the office today for f/u of chest tightness and abnormal CT scan results. He underwent CT scan of the chest on 11/18/2018 which demonstrated mid ascending thoracic aorta measuring 40 mm indicating fusiform dilatation. The CT scan was negative for coronary artery calcification, but found calcified granulomas in the right upper lobe, azygos lobe, and right hilum-mediastinum. Patient denies a family history of aneursyms. He does smoke about a pack of day for the past few decades. Denies frequent alcohol use. Underwent echo and stress and presents for f/u   Past Medical History:  Diagnosis Date  . 23-polyvalent pneumococcal polysaccharide vaccine declined 7/14  . Abnormal lung field   . Anxiety    prior therapy, in remission  . Asthma   . Atypical chest pain   . ED (erectile dysfunction)   . GERD (gastroesophageal reflux disease)   . GERD (gastroesophageal reflux disease)   . Hypercholesteremia   . Hyperglycemia   . Hypertension   . Obesity   . Thoracic ascending aortic aneurysm ([place])   . Tobacco use   . Wears glasses     Past Surgical History:  Procedure Laterality Date  . COLONOSCOPY  2009   [facility] for abdominal pain  . ESOPHAGOGASTRODUODENOSCOPY  2009   [facility], abdominal pain  . EYE SURGERY     strabismus surgery, right  . WISDOM TOOTH EXTRACTION      Social History   Socioeconomic History  . Marital status: Single    Spouse name: Not on file  . Number of children: 1  . Years of education: Not on file  . Highest education level: Not on file  Occupational History  . Not on file  Social  Needs  . Financial resource strain: Not on file  . Food insecurity:    Worry: Not on file    Inability: Not on file  . Transportation needs:    Medical: Not on file    Non-medical: Not on file  Tobacco Use  . Smoking status: Current Some Day Smoker    Packs/day: 0.25    Years: 30.00    Pack years: 7.50    Types: Cigarettes  . Smokeless tobacco: Never Used  Substance and Sexual Activity  . Alcohol use: Yes    Alcohol/week: 3.0 standard drinks    Types: 3 Glasses of wine per week  . Drug use: No  . Sexual activity: Not on file  Lifestyle  . Physical activity:    Days per week: Not on file    Minutes per session: Not on file  . Stress: Not on file  Relationships  . Social connections:    Talks on phone: Not on file    Gets together: Not on file    Attends religious service: Not on file    Active member of club or organization: Not on file    Attends meetings of clubs or organizations: Not on file    Relationship status: Not on file  . Intimate partner violence:    Fear of current or ex partner: Not on file    Emotionally  abused: Not on file    Physically abused: Not on file    Forced sexual activity: Not on file  Other Topics Concern  . Not on file  Social History Narrative   Girlfriend, domestic partner, teenage daughter.   Exercise - cardio, elliptical, power walking, free weights 3 days per week.  Working out with Physiological scientist.  Mortgage lending x 20 years.      Current Outpatient Medications on File Prior to Visit  Medication Sig Dispense Refill  . Multiple Vitamin (MULTIVITAMIN) tablet Take 1 tablet by mouth daily.    . pantoprazole (PROTONIX) 40 MG tablet Take 1 tablet (40 mg total) by mouth daily. 90 tablet 3  . tadalafil (CIALIS) 20 MG tablet Take 1 tablet (20 mg total) by mouth daily. as directed 10 tablet 11  . albuterol (PROVENTIL HFA;VENTOLIN HFA) 108 (90 Base) MCG/ACT inhaler Inhale 2 puffs into the lungs every 6 (six) hours as needed for wheezing.  (Patient not taking: Reported on 01/24/2019) 1 Inhaler 1  . fluticasone furoate-vilanterol (BREO ELLIPTA) 200-25 MCG/INH AEPB Inhale 1 puff into the lungs daily. (Patient not taking: Reported on 01/24/2019) 28 each 11   No current facility-administered medications on file prior to visit.      Review of Systems  Constitutional: Negative for malaise/fatigue and weight loss.  Respiratory: Negative for cough, hemoptysis and shortness of breath.   Cardiovascular: Negative for chest pain, palpitations, claudication and leg swelling.  Gastrointestinal: Negative for abdominal pain, blood in stool, constipation, heartburn and vomiting.  Genitourinary: Negative for dysuria.  Musculoskeletal: Negative for joint pain and myalgias.  Neurological: Negative for dizziness, focal weakness and headaches.  Endo/Heme/Allergies: Does not bruise/bleed easily.  Psychiatric/Behavioral: Negative for depression. The patient is not nervous/anxious.   All other systems reviewed and are negative.      Objective:  Blood pressure (!) 144/87, pulse 76, height 5' 11"  (1.803 m), SpO2 97 %. Body mass index is 29.15 kg/m.  Physical Exam  Constitutional: He appears well-developed. No distress.  Mildly obese  HENT:  Head: Atraumatic.  Eyes: Conjunctivae are normal.  Neck: Neck supple. No JVD present. No thyromegaly present.  Cardiovascular: Normal rate, regular rhythm, normal heart sounds and intact distal pulses. Exam reveals no gallop.  No murmur heard. Pulmonary/Chest: Effort normal and breath sounds normal.  Abdominal: Soft. Bowel sounds are normal.  Musculoskeletal: Normal range of motion.        General: No edema.  Neurological: He is alert.  Skin: Skin is warm and dry.  Psychiatric: He has a normal mood and affect.    CARDIAC STUDIES:   Echocardiogram 01/17/2019: Left ventricle cavity is normal in size. Mild concentric hypertrophy of the left ventricle. Normal global wall motion. Normal diastolic filling  pattern. Calculated EF 51%. No significant valvular abnormalities. IVC is dilated with respiratory variation. This may suggest elevated right heart pressure    Exercise sestamibi stress test 01/06/2019: 1. The patient performed treadmill exercise using Bruce protocol, completing 9:37 minutes. The patient completed an estimated workload of 11.1 METS, reaching 92% of the maximum predicted heart rate. Exercise capacity was excellent. Hemodynamic response was normal. Stress symptoms included fatigue. No ischemic changes seen on stress electrocardiogram. 2. The overall quality of the study is excellent. There is no evidence of abnormal lung activity. Stress and rest SPECT images demonstrate homogeneous tracer distribution throughout the myocardium. Gated SPECT imaging reveals normal myocardial thickening and wall motion. The left ventricular ejection fraction was normal (66%).   3. Low risk  study.  CTA Chest  11/18/2018: The mid ascending thoracic aorta measures 40 mm in diameter which does indicate fusiform dilatation. Recommend annual imaging  Assessment & Recommendations:   1. Essential hypertension Will increase Avapro to 3 mg daily, BMP was reviewed, serum creatinine stable and potassium levels are normal.  2. Thoracic aortic aneurysm without rupture (HCC) - CT ANGIO CHEST AORTA W/CM &/OR WO/CM; Future, in one year prior to his next follow-up.  3. Hypercholesteremia Continue lifestyle modification for now, advised him that if his lipids are not at goal by next office visit, he should certainly be on a statin.  4. Tobacco use He appears to be very motivated for smoking cessation.  States that she has cut down from one pack of cigarettes a day to less than a pack now since last office visit.  Other  Labs 03/07/2018: Serum glucose 98 mg, BUN 18, creatinine 1.13, eGFR greater than 69, potassium 4.2.  CMP otherwise normal.  CBC normal, total cholesterol 217, triglycerides 52, HDL 64, LDL 143.   Non-HDL cholesterol 165.  A1c 5.7%.  Normal TSH in 2018.   Recommendation: I'll see him back in the office in one year.  I have extensively discussed with him regarding the CT scan, aortic aneurysm and presentation of aneurysm, lipids. I have also reassured him that his aneurysm is relatively small.  I have discussed the etiology for AAA. signs of and symptoms of dissection including severe chest/abdominal pain, radiation to the back.  I have discussed the presentation of rapidly expanding aneurysm and also signs and symptoms of aneurysm rupture with the patient. Indications for going to the ED for urgent evaluation. Importance of continued screening discussed.   Adrian Prows, MD, [facility] 01/24/2019, 5:56 PM [facility] [place] Pager: [phone] Office: [phone] If no answer Cell [phone]

## 2019-01-30 DIAGNOSIS — F4322 Adjustment disorder with anxiety: Secondary | ICD-10-CM | POA: Diagnosis not present

## 2019-02-09 DIAGNOSIS — F4322 Adjustment disorder with anxiety: Secondary | ICD-10-CM | POA: Diagnosis not present

## 2019-02-16 DIAGNOSIS — F4322 Adjustment disorder with anxiety: Secondary | ICD-10-CM | POA: Diagnosis not present

## 2019-02-23 DIAGNOSIS — F4322 Adjustment disorder with anxiety: Secondary | ICD-10-CM | POA: Diagnosis not present

## 2019-03-02 ENCOUNTER — Other Ambulatory Visit: Payer: Self-pay | Admitting: Medical

## 2019-03-02 DIAGNOSIS — K219 Gastro-esophageal reflux disease without esophagitis: Secondary | ICD-10-CM

## 2019-03-02 DIAGNOSIS — F4322 Adjustment disorder with anxiety: Secondary | ICD-10-CM | POA: Diagnosis not present

## 2019-03-02 NOTE — Telephone Encounter (Signed)
Is this okay to refill? 

## 2019-03-09 DIAGNOSIS — F4322 Adjustment disorder with anxiety: Secondary | ICD-10-CM | POA: Diagnosis not present

## 2019-03-16 DIAGNOSIS — F4322 Adjustment disorder with anxiety: Secondary | ICD-10-CM | POA: Diagnosis not present

## 2019-03-21 DIAGNOSIS — F4322 Adjustment disorder with anxiety: Secondary | ICD-10-CM | POA: Diagnosis not present

## 2019-03-23 DIAGNOSIS — F4322 Adjustment disorder with anxiety: Secondary | ICD-10-CM | POA: Diagnosis not present

## 2019-03-30 DIAGNOSIS — F4322 Adjustment disorder with anxiety: Secondary | ICD-10-CM | POA: Diagnosis not present

## 2019-04-06 DIAGNOSIS — F4322 Adjustment disorder with anxiety: Secondary | ICD-10-CM | POA: Diagnosis not present

## 2019-04-08 ENCOUNTER — Other Ambulatory Visit: Payer: Self-pay | Admitting: Medical

## 2019-04-10 NOTE — Telephone Encounter (Signed)
Is this okay to refill? 

## 2019-04-18 DIAGNOSIS — S46812A Strain of other muscles, fascia and tendons at shoulder and upper arm level, left arm, initial encounter: Secondary | ICD-10-CM | POA: Diagnosis not present

## 2019-05-01 DIAGNOSIS — M9901 Segmental and somatic dysfunction of cervical region: Secondary | ICD-10-CM | POA: Diagnosis not present

## 2019-05-01 DIAGNOSIS — M5412 Radiculopathy, cervical region: Secondary | ICD-10-CM | POA: Diagnosis not present

## 2019-05-01 DIAGNOSIS — M9902 Segmental and somatic dysfunction of thoracic region: Secondary | ICD-10-CM | POA: Diagnosis not present

## 2019-05-01 DIAGNOSIS — M5020 Other cervical disc displacement, unspecified cervical region: Secondary | ICD-10-CM | POA: Diagnosis not present

## 2019-05-31 ENCOUNTER — Other Ambulatory Visit: Payer: Self-pay | Admitting: Medical

## 2019-05-31 DIAGNOSIS — K219 Gastro-esophageal reflux disease without esophagitis: Secondary | ICD-10-CM

## 2019-05-31 NOTE — Telephone Encounter (Signed)
Is this ok to refill?  

## 2019-06-08 ENCOUNTER — Other Ambulatory Visit: Payer: Self-pay | Admitting: Medical

## 2019-06-19 DIAGNOSIS — K602 Anal fissure, unspecified: Secondary | ICD-10-CM | POA: Diagnosis not present

## 2019-06-19 DIAGNOSIS — K625 Hemorrhage of anus and rectum: Secondary | ICD-10-CM | POA: Diagnosis not present

## 2019-06-19 DIAGNOSIS — K6289 Other specified diseases of anus and rectum: Secondary | ICD-10-CM | POA: Diagnosis not present

## 2019-06-21 ENCOUNTER — Encounter: Payer: Self-pay | Admitting: Podiatry

## 2019-06-21 ENCOUNTER — Ambulatory Visit: Payer: BC Managed Care – PPO | Admitting: Podiatry

## 2019-06-21 ENCOUNTER — Other Ambulatory Visit: Payer: Self-pay

## 2019-06-21 VITALS — Temp 97.9°F

## 2019-06-21 DIAGNOSIS — L600 Ingrowing nail: Secondary | ICD-10-CM

## 2019-06-21 NOTE — Progress Notes (Signed)
Subjective:   Patient ID: Cody Tapia, male   DOB: 53 y.o.   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  Patient states he does not remember specific injury they have just been this way long time there is a small dark spot in the left one of just several weeks duration.  Patient smokes quarter pack per day and likes to be active   Review of Systems  All other systems reviewed and are negative.       Objective:  Physical Exam Vitals signs and nursing note reviewed.  Constitutional:      Appearance: He is well-developed.  Pulmonary:     Effort: Pulmonary effort is normal.  Musculoskeletal: Normal range of motion.  Skin:    General: Skin is warm.  Neurological:     Mental Status: He is alert.     Neurovascular status intact muscle strength found to be adequate range of motion within normal limits.  Patient does have incurvated medial and lateral borders of the hallux bilateral that are painful when pressed with some thickness of the center nail and what appears to be a small trauma spot on the left hallux nail proximal portion that is only a short-term duration     Assessment:  Ingrown toenail deformity hallux bilateral medial lateral borders with pain with thickness of the nail beds bilateral     Plan:  H&P condition reviewed and I do think long-term removal of the medial lateral nail borders of each hallux would be at best benefit to the patient.  Patient wants this done but cannot do it currently and is scheduled for the procedure and I did discuss the small blood spot I do not think to be a problem will be watched and could be biopsied at one point in future if it becomes a problem

## 2019-06-30 ENCOUNTER — Ambulatory Visit: Payer: BC Managed Care – PPO | Admitting: Podiatry

## 2019-08-09 ENCOUNTER — Other Ambulatory Visit: Payer: Self-pay | Admitting: Medical

## 2019-08-31 ENCOUNTER — Other Ambulatory Visit: Payer: Self-pay | Admitting: Medical

## 2019-08-31 DIAGNOSIS — K219 Gastro-esophageal reflux disease without esophagitis: Secondary | ICD-10-CM

## 2019-09-04 ENCOUNTER — Other Ambulatory Visit: Payer: Self-pay

## 2019-09-04 ENCOUNTER — Encounter: Payer: Self-pay | Admitting: Medical

## 2019-09-04 ENCOUNTER — Ambulatory Visit: Payer: BC Managed Care – PPO | Admitting: Medical

## 2019-09-04 VITALS — BP 126/80 | HR 75 | Temp 97.7°F | Ht 71.0 in | Wt 223.8 lb

## 2019-09-04 DIAGNOSIS — E669 Obesity, unspecified: Secondary | ICD-10-CM

## 2019-09-04 DIAGNOSIS — R05 Cough: Secondary | ICD-10-CM | POA: Insufficient documentation

## 2019-09-04 DIAGNOSIS — R059 Cough, unspecified: Secondary | ICD-10-CM | POA: Insufficient documentation

## 2019-09-04 DIAGNOSIS — N529 Male erectile dysfunction, unspecified: Secondary | ICD-10-CM

## 2019-09-04 DIAGNOSIS — K219 Gastro-esophageal reflux disease without esophagitis: Secondary | ICD-10-CM

## 2019-09-04 DIAGNOSIS — R030 Elevated blood-pressure reading, without diagnosis of hypertension: Secondary | ICD-10-CM

## 2019-09-04 DIAGNOSIS — J45909 Unspecified asthma, uncomplicated: Secondary | ICD-10-CM | POA: Diagnosis not present

## 2019-09-04 DIAGNOSIS — I712 Thoracic aortic aneurysm, without rupture, unspecified: Secondary | ICD-10-CM | POA: Insufficient documentation

## 2019-09-04 MED ORDER — PANTOPRAZOLE SODIUM 40 MG PO TBEC: 40.0000 mg | DELAYED_RELEASE_TABLET | Freq: Every day | ORAL | 11 refills | Status: DC

## 2019-09-04 MED ORDER — ALBUTEROL SULFATE HFA 108 (90 BASE) MCG/ACT IN AERS: 2.0000 | INHALATION_SPRAY | Freq: Four times a day (QID) | RESPIRATORY_TRACT | 1 refills | Status: DC | PRN

## 2019-09-04 MED ORDER — TADALAFIL 20 MG PO TABS: 20.0000 mg | ORAL_TABLET | Freq: Every day | ORAL | 5 refills | Status: DC

## 2019-09-04 NOTE — Progress Notes (Addendum)
Subjective:  Cody Tapia is a 53 y.o. male who presents for Chief Complaint  Patient presents with  . Wheezing    fatigue possibly due to season change      Here for complaint of fatigue, cough.  He has about 1.5 to 2 weeks ago he had cold symptoms including head congestion, chest congestion, mild sore throat from postnasal drainage, feels rundown, but no fever, no shortness of breath no loss of sense of smell or taste, no nausea or vomiting, no sick contacts.  He continues to feel wheezy at times, occasional, but worsened as his wheezing.  He notes seeing Cody Tapia cardiology last back in March 2020 and this past year for abnormal EKG, chest pain and ultimately was found to have a thoracic aortic aneurysm which they are now monitoring yearly.  He was advised to begin blood pressure medicine stop smoking in getting better health.  He is unfortunately gained a lot of weight this year.  He did start a blood pressure pill.  No other aggravating or relieving factors.    No other c/o.  The following portions of the patient's history were reviewed and updated as appropriate: allergies, current medications, past family history, past medical history, past social history, past surgical history and problem list.  ROS Otherwise as in subjective above  Objective: BP 126/80   Pulse 75   Temp 97.7 F (36.5 C)   Ht 5\' 11"  (1.803 m)   Wt 223 lb 12.8 oz (101.5 kg)   SpO2 97%   BMI 31.21 kg/m   General appearance: alert, no distress, well developed, well nourished HEENT: normocephalic, sclerae anicteric, conjunctiva pink and moist, TMs pearly, nares patent, no discharge or erythema, pharynx normal Oral cavity: MMM, no lesions Neck: supple, no lymphadenopathy, no thyromegaly, no masses Heart: RRR, normal S1, S2, no murmurs Lungs: CTA bilaterally, no wheezes, rhonchi, or rales Abdomen: +bs, soft, non tender, non distended, no masses, no hepatomegaly, no splenomegaly Pulses: 2+ radial pulses,  2+ pedal pulses, normal cap refill Ext: no edema   Assessment: Encounter Diagnoses  Name Primary?  . Cough Yes  . Erectile dysfunction, unspecified erectile dysfunction type   . Gastroesophageal reflux disease without esophagitis   . Extrinsic asthma without complication, unspecified asthma severity, unspecified whether persistent   . Class 1 obesity with serious comorbidity in adult, unspecified BMI, unspecified obesity type   . Elevated blood-pressure reading without diagnosis of hypertension   . Thoracic aortic aneurysm without rupture ([place])      Plan: We discussed recent symptoms which could have been a mild respiratory tract infection but cannot rule out Covid completely.  Advise he can use albuterol inhaler every 4-6 hours as needed, avoid triggers.  Advised rest, hydration, and will have him come back for blood pressure recheck, weight recheck. He can return in 2 to 3 weeks for covid antibody test..  Is to repeat this at this point.  Erectile dysfunction doing fine on current therapy, refilled medication as below  GERD-discussed long-term risk and benefits of medication.  Can refer if symptoms persist  Elevated blood pressure-continue to monitor, work on diet and exercise  I reviewed the cardiology notes and, MRI of chest.  Advise healthy diet, low-fat diet, he will follow-up with cardiology in March 2021    Cody Tapia was seen today for wheezing.  Diagnoses and all orders for this visit:  Cough  Erectile dysfunction, unspecified erectile dysfunction type  Gastroesophageal reflux disease without esophagitis -  pantoprazole (PROTONIX) 40 MG tablet; Take 1 tablet (40 mg total) by mouth daily.  Extrinsic asthma without complication, unspecified asthma severity, unspecified whether persistent  Class 1 obesity with serious comorbidity in adult, unspecified BMI, unspecified obesity type  Elevated blood-pressure reading without diagnosis of hypertension  Thoracic aortic  aneurysm without rupture ([place])  Other orders -     tadalafil (CIALIS) 20 MG tablet; Take 1 tablet (20 mg total) by mouth daily. as directed -     albuterol (VENTOLIN HFA) 108 (90 Base) MCG/ACT inhaler; Inhale 2 puffs into the lungs every 6 (six) hours as needed for wheezing.    Follow up: Pending callback

## 2019-09-06 ENCOUNTER — Ambulatory Visit: Payer: BLUE CROSS/BLUE SHIELD | Admitting: Cardiology

## 2019-09-06 ENCOUNTER — Other Ambulatory Visit: Payer: Self-pay

## 2019-09-06 ENCOUNTER — Ambulatory Visit: Payer: BC Managed Care – PPO | Admitting: Cardiology

## 2019-09-06 DIAGNOSIS — Z20828 Contact with and (suspected) exposure to other viral communicable diseases: Secondary | ICD-10-CM | POA: Diagnosis not present

## 2019-09-06 DIAGNOSIS — Z20822 Contact with and (suspected) exposure to covid-19: Secondary | ICD-10-CM

## 2019-09-07 ENCOUNTER — Other Ambulatory Visit: Payer: Self-pay

## 2019-09-07 ENCOUNTER — Encounter: Payer: Self-pay | Admitting: Medical

## 2019-09-07 ENCOUNTER — Ambulatory Visit: Payer: BC Managed Care – PPO | Admitting: Medical

## 2019-09-07 VITALS — Ht 71.0 in | Wt 223.0 lb

## 2019-09-07 DIAGNOSIS — R05 Cough: Secondary | ICD-10-CM

## 2019-09-07 DIAGNOSIS — R059 Cough, unspecified: Secondary | ICD-10-CM

## 2019-09-07 DIAGNOSIS — R062 Wheezing: Secondary | ICD-10-CM

## 2019-09-07 LAB — NOVEL CORONAVIRUS, NAA: SARS-CoV-2, NAA: NOT DETECTED

## 2019-09-07 MED ORDER — PREDNISONE 10 MG PO TABS: ORAL_TABLET | ORAL | 0 refills | Status: DC

## 2019-09-07 MED ORDER — AZITHROMYCIN 250 MG PO TABS: ORAL_TABLET | ORAL | 0 refills | Status: DC

## 2019-09-07 MED ORDER — HYDROCODONE-HOMATROPINE 5-1.5 MG/5ML PO SYRP: 5.0000 mL | ORAL_SOLUTION | Freq: Three times a day (TID) | ORAL | 0 refills | Status: AC | PRN

## 2019-09-07 NOTE — Progress Notes (Signed)
Subjective:     Patient ID: Cody Tapia, male   DOB: Aug 16, 1966, 53 y.o.   MRN: [id number]  This visit type was conducted due to national recommendations for restrictions regarding the COVID-19 Pandemic (e.g. social distancing) in an effort to limit this patient's exposure and mitigate transmission in our community.  Due to their co-morbid illnesses, this patient is at least at moderate risk for complications without adequate follow up.  This format is felt to be most appropriate for this patient at this time.    Documentation for virtual audio and video telecommunications through Zoom encounter:  The patient was located at home. The provider was located in the office. The patient did consent to this visit and is aware of possible charges through their insurance for this visit.  The other persons participating in this telemedicine service were none. Time spent on call was 20 minutes and in review of previous records >20 minutes total.  This virtual service is not related to other E/M service within previous 7 days.   HPI Chief Complaint  Patient presents with  . Cough    wheezing-not feeling better-sob    Virtual consult today for recheck from in person visit a few days ago.  At that visit I had him restart Breo and albuterol but he is only using both about once daily, he does not see a big improvement so far.  Symptoms are off and on.  Some days better, some days worse.  Still feels like shit, wheezing all night.  In the mornings, hacking, coughing, clearing out lungs.  He is using the inhalers with some help. Hasn't had fever in the last 3 weeks.  No other change in symptoms since his visit few days ago  Review of Systems As in subjective    Objective:   Physical Exam Due to coronavirus pandemic stay at home measures, patient visit was virtual and they were not examined in person.   Ht 5\' 11"  (1.803 m)   Wt 223 lb (101.2 kg)   BMI 31.10 kg/m       Assessment:      Encounter Diagnoses  Name Primary?  . Cough Yes  . Wheezing        Plan:     As we discussed a few days ago it has in person visit, his symptoms suggest that you he had Covid or has reactive airway due to allergies or recent respiratory tract infection.  He is no worse than he was a few days ago when I saw him.  We discussed his symptoms and concerns.  He is only using the albuterol minimally.  I will have him increase albuterol to 2 puffs every 4 hours as needed, increase Breo to twice daily for the next week, rest, hydrate well, can use cough syrup below, can use Benadryl over-the-counter for mucus and congestion.  If not any improved by Sunday particularly if fever, colored mucus, worsening chest congestion then begin Z-Pak.  If not do not start Z-Pak.  He went for Covid rapid test yesterday so we will await that result.  That result will likely be negative but I still asked him to come back here in 1 to 2 weeks for Covid antibody test for the IgG antibody.  Reilley was seen today for cough.  Diagnoses and all orders for this visit:  Cough  Wheezing  Other orders -     predniSONE (DELTASONE) 10 MG tablet; 6/5/4/3/2/1 taper -     HYDROcodone-homatropine (HYCODAN)  5-1.5 MG/5ML syrup; Take 5 mLs by mouth every 8 (eight) hours as needed for up to 5 days. -     azithromycin (ZITHROMAX) 250 MG tablet; 2 tablets day 1, then 1 tablet days 2-4

## 2019-09-07 NOTE — Addendum Note (Signed)
Addended by: Carlena Hurl on: 09/07/2019 09:15 AM   Modules accepted: Orders

## 2019-09-11 ENCOUNTER — Ambulatory Visit
Admission: RE | Admit: 2019-09-11 | Discharge: 2019-09-11 | Disposition: A | Discharge status: Home / Self Care | Payer: BC Managed Care – PPO | Source: Ambulatory Visit | Attending: Medical | Admitting: Medical

## 2019-09-11 ENCOUNTER — Other Ambulatory Visit: Payer: Self-pay | Admitting: Medical

## 2019-09-11 ENCOUNTER — Telehealth: Payer: Self-pay | Admitting: Medical

## 2019-09-11 DIAGNOSIS — R0602 Shortness of breath: Secondary | ICD-10-CM

## 2019-09-11 DIAGNOSIS — R062 Wheezing: Secondary | ICD-10-CM

## 2019-09-11 DIAGNOSIS — R05 Cough: Secondary | ICD-10-CM | POA: Diagnosis not present

## 2019-09-11 IMAGING — DX DG CHEST 2V
1 series · 1 of 1 positions shown · non-contrast
Comparison: Chest radiograph [DATE]

CLINICAL DATA: SOB and wheezing with mild productive cough x 1-2
weeks, negative COVID test [DATE], HTN. Hx smoker, asthma

EXAM:
CHEST - 2 VIEW

[dg chest 2 view]
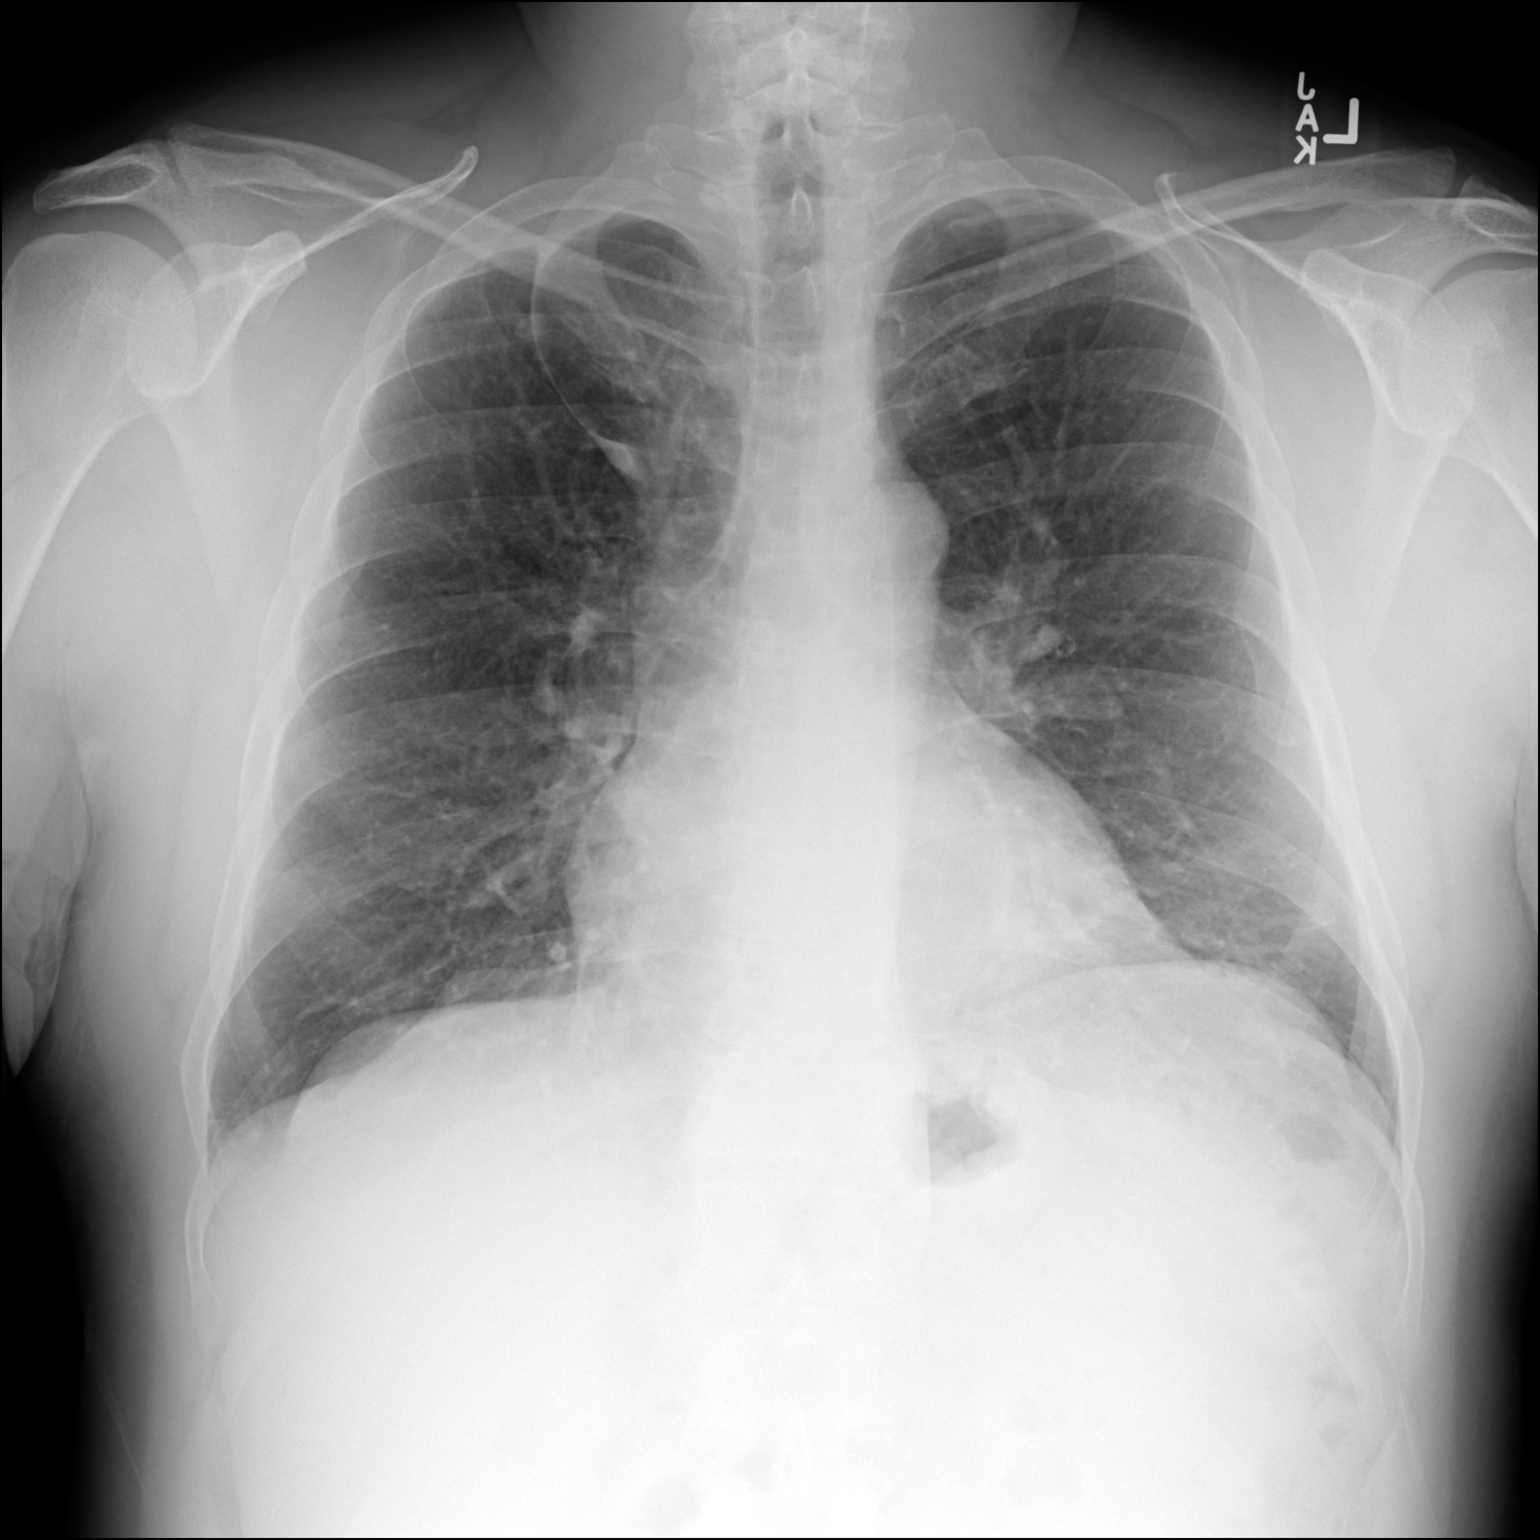

[1 of 1 positions shown; findings below may reference images not displayed]

FINDINGS: The heart size and mediastinal contours are within normal limits.
Mildly prominent interstitium bilaterally. No new focal opacity to
suggest an active disease or edema. No pneumothorax or pleural
effusion. The visualized skeletal structures are unremarkable.
IMPRESSION: 1. No evidence of active disease in the chest.
2. Mildly prominent interstitium could represent bronchitic changes
or reactive airways disease.

## 2019-09-11 NOTE — Telephone Encounter (Signed)
See xray result note.  °

## 2019-09-11 NOTE — Telephone Encounter (Signed)
Called patient and lmom to call back for results. Sent results to [facility]

## 2019-09-11 NOTE — Telephone Encounter (Signed)
Pt called back and states that he is still not feeling better chest and ribs are hurting from coughing so much, when he blows his  Nose it is a extreme pain in his rib cage, state he is still very SOB, pt states also that his COVID test is negative,. Pt states he has 2 days left of he steroids and pt has been using his inhaler, he wants to know if he needs to go get a chest xray to make sure noting else is going on, pt [place], [place] pt  Can be reached at [phone] with any questions,

## 2019-09-11 NOTE — Telephone Encounter (Signed)
Pt is going to get a chest xray per shane

## 2019-09-15 ENCOUNTER — Ambulatory Visit: Payer: Self-pay | Admitting: Cardiology

## 2019-09-20 ENCOUNTER — Encounter: Payer: BC Managed Care – PPO | Admitting: Medical

## 2019-09-29 ENCOUNTER — Other Ambulatory Visit: Payer: Self-pay

## 2019-09-29 ENCOUNTER — Ambulatory Visit: Payer: BC Managed Care – PPO | Admitting: Medical

## 2019-09-29 VITALS — Ht 71.0 in | Wt 215.0 lb

## 2019-09-29 DIAGNOSIS — R059 Cough, unspecified: Secondary | ICD-10-CM

## 2019-09-29 DIAGNOSIS — R05 Cough: Secondary | ICD-10-CM | POA: Diagnosis not present

## 2019-09-29 DIAGNOSIS — R0602 Shortness of breath: Secondary | ICD-10-CM

## 2019-09-29 DIAGNOSIS — J45909 Unspecified asthma, uncomplicated: Secondary | ICD-10-CM | POA: Diagnosis not present

## 2019-09-29 MED ORDER — BUDESONIDE-FORMOTEROL FUMARATE 160-4.5 MCG/ACT IN AERO: 2.0000 | INHALATION_SPRAY | Freq: Two times a day (BID) | RESPIRATORY_TRACT | 0 refills | Status: DC

## 2019-09-29 NOTE — Progress Notes (Signed)
Subjective:     Patient ID: Cody Tapia, male   DOB: [birth year], 53 y.o.   MRN: [id number]  This visit type was conducted due to national recommendations for restrictions regarding the COVID-19 Pandemic (e.g. social distancing) in an effort to limit this patient's exposure and mitigate transmission in our community.  Due to their co-morbid illnesses, this patient is at least at moderate risk for complications without adequate follow up.  This format is felt to be most appropriate for this patient at this time.    Documentation for virtual audio and video telecommunications through Zoom encounter:  The patient was located at home. The provider was located in the office. The patient did consent to this visit and is aware of possible charges through their insurance for this visit.  The other persons participating in this telemedicine service were none. Time spent on call was 20 minutes and in review of previous records >20 minutes total.  This virtual service is not related to other E/M service within previous 7 days.   HPI Chief Complaint  Patient presents with  . Cough    with weakness and sob    I initially did a visit with him virtual on 09/04/19 for fatigue, cough that started around the first week of Octrober including head congestion, cough, cold symptoms, sore throat, drainage.   Over the course of the last few weeks he has used Albuterol inhaler, a course of prednisone taper, BREO inhaler, a course of Zpak antibiotic after he called back in.  He reports 85% improvement with the steroids and antibiotic after last phone call.   However, he is still having some issues.   This past weekend went to [place].   Had to stop several times out of breath.    This week back to coughing, ribs sore, nasal congestion, some coughing.   Started on some alka seltzer plus, using the albuterol some.   Still feeling run down and tired.    Now starting to wonder  about bronchitis.  Just not back to baseline . Last few days felt crappy again.   Using some cough syrup.   Hydrating.  Has started back up with cough, some shortness of breath, congestion.  Still using Breo, still using albuterol.    No body aches, no chills no fever no productive cough with dark colors, no sore throat, no nausea or vomiting, no sick contacts, no change in sense of taste or smell.   No other aggravating or relieving factors. No other complaint.   Review of Systems As in subjective    Objective:   Physical Exam Due to coronavirus pandemic stay at home measures, patient visit was virtual and they were not examined in person.   Ht 5\' 11"  (1.803 m)   Wt 215 lb (97.5 kg)   BMI 29.99 kg/m   Wt Readings from Last 3 Encounters:  09/29/19 215 lb (97.5 kg)  09/07/19 223 lb (101.2 kg)  09/04/19 223 lb 12.8 oz (101.5 kg)   General: Pleasant, answers questions appropriate, no labored breathing, no obvious wheezing or dyspnea He does cough some during the interview  Chest xray 09/11/19 IMPRESSION: 1. No evidence of active disease in the chest. 2. Mildly prominent interstitium could represent bronchitic changes or reactive airways disease.  covid swab negative on 09/06/19       Assessment:     Encounter Diagnoses  Name Primary?  . SOB (shortness of breath) Yes  . Cough   .  Extrinsic asthma without complication, unspecified asthma severity, unspecified whether persistent        Plan:     Based on the conversation today he did seem to improve mostly from the October illness and symptoms.  His recent symptoms are likely asthma flareup from cold weather and leaf litter now that we have entered fall and early winter.  He seems to get response from his inhalers but may be not as much response as needed.  We will continue albuterol HFA, but change from [facility] preventative inhaler to Symbicort trial.  He will come by and pick up samples of Symbicort today.  We discussed  proper use.  He will avoid triggers and possible.  He can use Mucinex as needed.  He will consider adding an allergy pill given the leaf litter and in the past has had some fall allergy symptoms during this time a year.  Advised if symptoms change or worsen over the next several days suggestive of infection or much worse symptoms to get reevaluated or call back.  Going forward consider home albuterol and nebulizer therapy.  Consider recheck with pulmonology with Dr. Melvyn Novas.    If new or other symptoms that do not suggest asthma, consider other evaluation such as CT chest, follow-up in person for exam and vitals, labs.  Jakorey was seen today for cough.  Diagnoses and all orders for this visit:  SOB (shortness of breath)  Cough  Extrinsic asthma without complication, unspecified asthma severity, unspecified whether persistent  Other orders -     budesonide-formoterol (SYMBICORT) 160-4.5 MCG/ACT inhaler; Inhale 2 puffs into the lungs 2 (two) times daily.

## 2019-10-02 ENCOUNTER — Emergency Department (HOSPITAL_COMMUNITY)
Admission: EM | Admit: 2019-10-02 | Discharge: 2019-10-02 | Disposition: A | Discharge status: Home / Self Care | Payer: BC Managed Care – PPO | Attending: Emergency Medicine | Admitting: Emergency Medicine

## 2019-10-02 ENCOUNTER — Telehealth: Payer: Self-pay | Admitting: Medical

## 2019-10-02 ENCOUNTER — Other Ambulatory Visit: Payer: Self-pay

## 2019-10-02 ENCOUNTER — Emergency Department (HOSPITAL_COMMUNITY): Payer: BC Managed Care – PPO

## 2019-10-02 DIAGNOSIS — J40 Bronchitis, not specified as acute or chronic: Secondary | ICD-10-CM | POA: Insufficient documentation

## 2019-10-02 DIAGNOSIS — R05 Cough: Secondary | ICD-10-CM | POA: Diagnosis not present

## 2019-10-02 DIAGNOSIS — I10 Essential (primary) hypertension: Secondary | ICD-10-CM | POA: Insufficient documentation

## 2019-10-02 DIAGNOSIS — F17210 Nicotine dependence, cigarettes, uncomplicated: Secondary | ICD-10-CM | POA: Insufficient documentation

## 2019-10-02 DIAGNOSIS — R0602 Shortness of breath: Secondary | ICD-10-CM

## 2019-10-02 DIAGNOSIS — Z79899 Other long term (current) drug therapy: Secondary | ICD-10-CM | POA: Insufficient documentation

## 2019-10-02 DIAGNOSIS — Z20828 Contact with and (suspected) exposure to other viral communicable diseases: Secondary | ICD-10-CM | POA: Insufficient documentation

## 2019-10-02 DIAGNOSIS — R059 Cough, unspecified: Secondary | ICD-10-CM

## 2019-10-02 LAB — CBC WITH DIFFERENTIAL/PLATELET
Abs Immature Granulocytes: 0.05 10*3/uL (ref 0.00–0.07)
Basophils Absolute: 0 10*3/uL (ref 0.0–0.1)
Basophils Relative: 0 %
Eosinophils Absolute: 1 10*3/uL — ABNORMAL HIGH (ref 0.0–0.5)
Eosinophils Relative: 10 %
HCT: 47 % (ref 39.0–52.0)
Hemoglobin: 15.7 g/dL (ref 13.0–17.0)
Immature Granulocytes: 1 %
Lymphocytes Relative: 20 %
Lymphs Abs: 1.9 10*3/uL (ref 0.7–4.0)
MCH: 31.7 pg (ref 26.0–34.0)
MCHC: 33.4 g/dL (ref 30.0–36.0)
MCV: 94.8 fL (ref 80.0–100.0)
Monocytes Absolute: 1 10*3/uL (ref 0.1–1.0)
Monocytes Relative: 10 %
Neutro Abs: 5.7 10*3/uL (ref 1.7–7.7)
Neutrophils Relative %: 59 %
Platelets: 284 10*3/uL (ref 150–400)
RBC: 4.96 MIL/uL (ref 4.22–5.81)
RDW: 13.1 % (ref 11.5–15.5)
WBC: 9.7 10*3/uL (ref 4.0–10.5)
nRBC: 0 % (ref 0.0–0.2)

## 2019-10-02 LAB — BASIC METABOLIC PANEL
Anion gap: 11 (ref 5–15)
BUN: 16 mg/dL (ref 6–20)
CO2: 25 mmol/L (ref 22–32)
Calcium: 9.5 mg/dL (ref 8.9–10.3)
Chloride: 101 mmol/L (ref 98–111)
Creatinine, Ser: 1.16 mg/dL (ref 0.61–1.24)
GFR calc Af Amer: >60 mL/min (ref >60)
GFR calc non Af Amer: >60 mL/min (ref >60)
Glucose, Bld: 107 mg/dL — ABNORMAL HIGH (ref 70–99)
Potassium: 4.2 mmol/L (ref 3.5–5.1)
Sodium: 137 mmol/L (ref 135–145)

## 2019-10-02 LAB — SARS CORONAVIRUS 2 (TAT 6-24 HRS): SARS Coronavirus 2: NEGATIVE

## 2019-10-02 LAB — LACTIC ACID, PLASMA: Lactic Acid, Venous: 1.6 mmol/L (ref 0.5–1.9)

## 2019-10-02 IMAGING — CT CT ANGIO CHEST
2 of 5 series · 16 of 36 positions shown · IV contrast (omnipaque)
Comparison: Chest x-ray of the same date, CT chest of [DATE]
COMPARISON: Chest x-ray of the same date, CT chest of [DATE]

Addendum:
CLINICAL DATA: Shortness of breath, persistent shortness of breath
for 5 weeks without improvement.

EXAM:
CT ANGIOGRAPHY CHEST WITH CONTRAST
TECHNIQUE: Multidetector CT imaging of the chest was performed using the
standard protocol during bolus administration of intravenous
contrast. Multiplanar CT image reconstructions and MIPs were
obtained to evaluate the vascular anatomy.
CONTRAST:  100mL OMNIPAQUE IOHEXOL 350 MG/ML SOLN

[Series 8: pe thins · axial · 0.80mm/px · z∈[-308,-28]mm · 13 of 446 slices shown]
[im 23/446  lung]
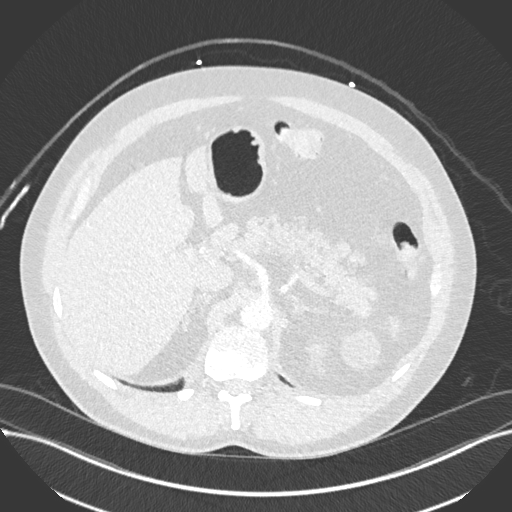
[im 67/446  mediastinal]
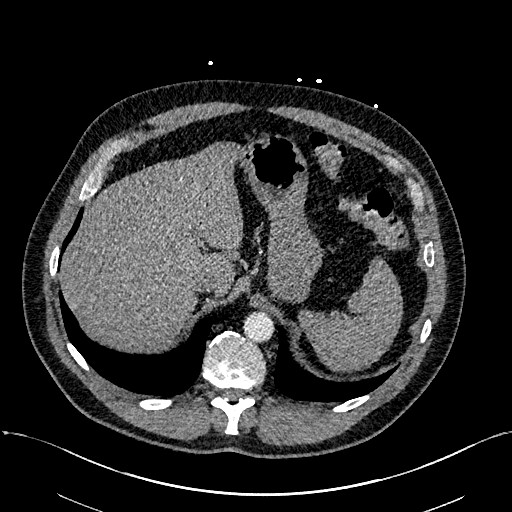
[im 90/446  lung]
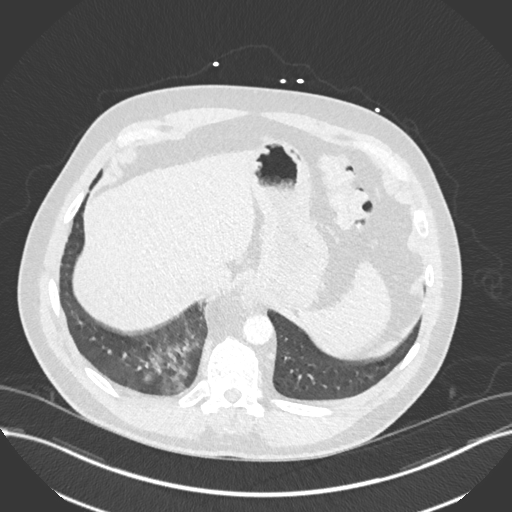
[im 134/446  mediastinal]
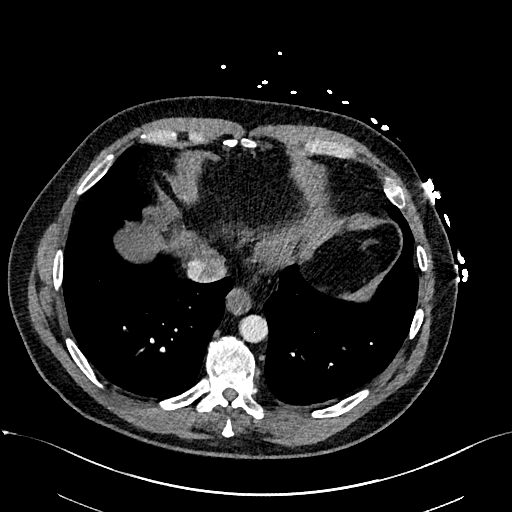
[im 156/446  lung]
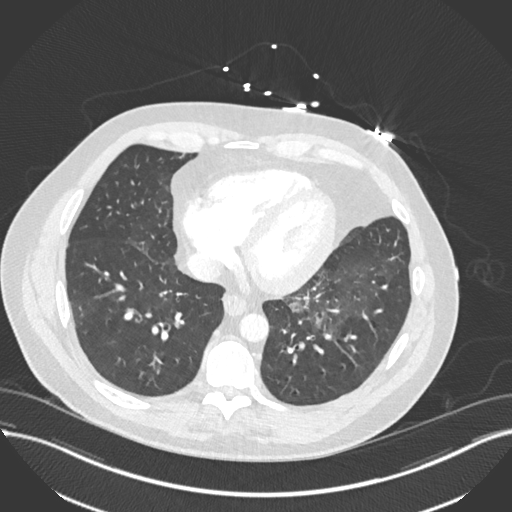
[im 201/446  mediastinal]
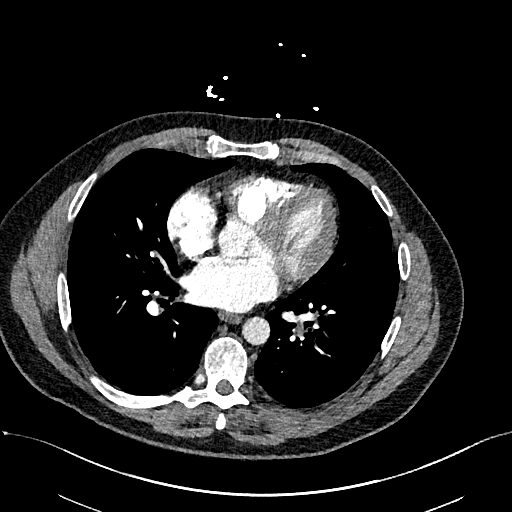
[im 223/446  lung]
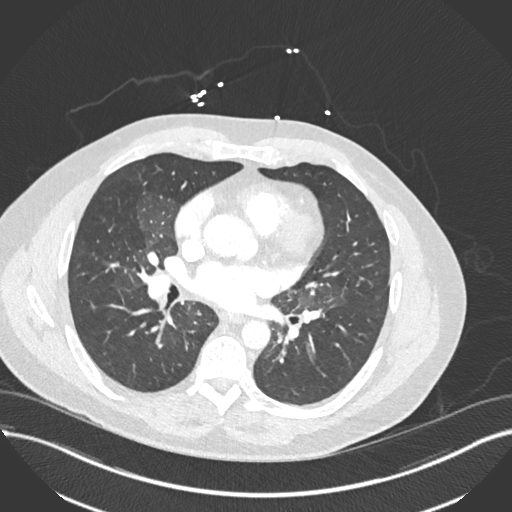
[im 245/446  mediastinal]
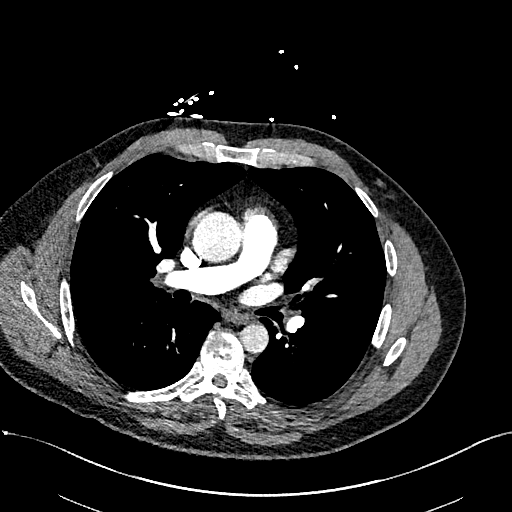
[im 290/446  lung]
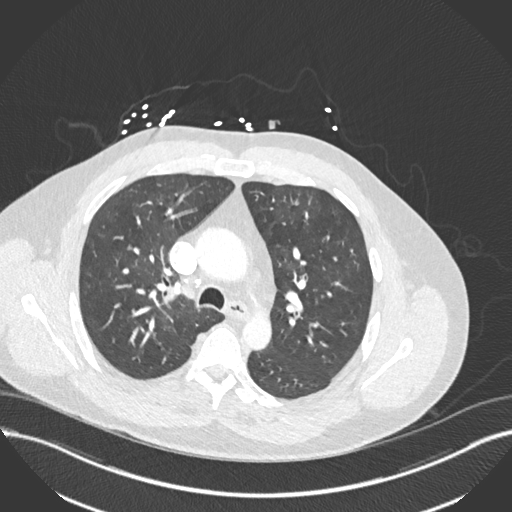
[im 312/446  mediastinal]
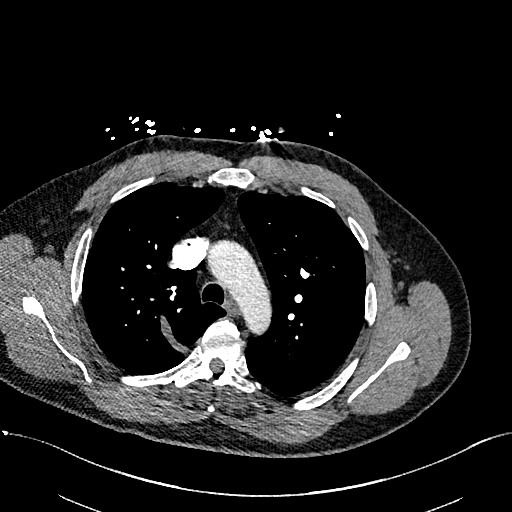
[im 357/446  lung]
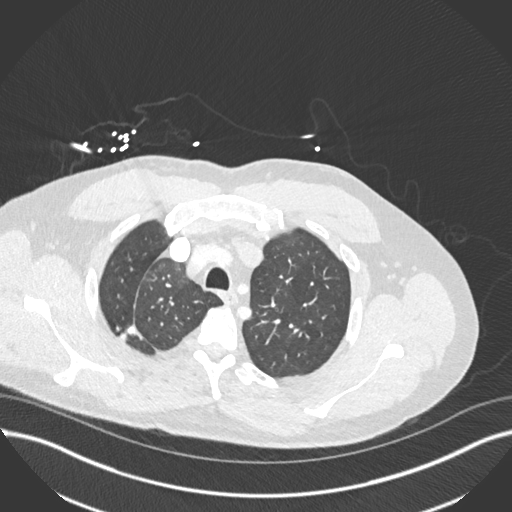
[im 379/446  mediastinal]
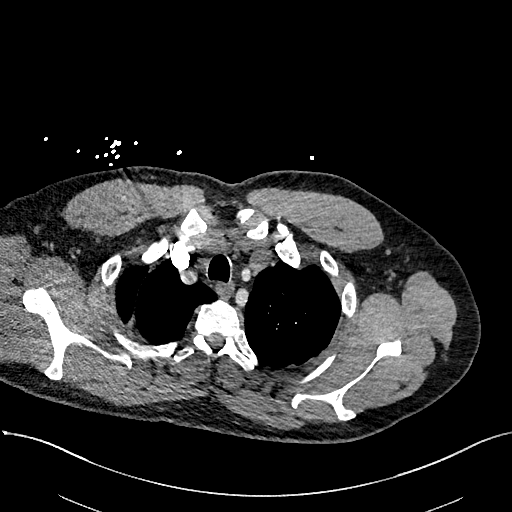
[im 423/446  lung]
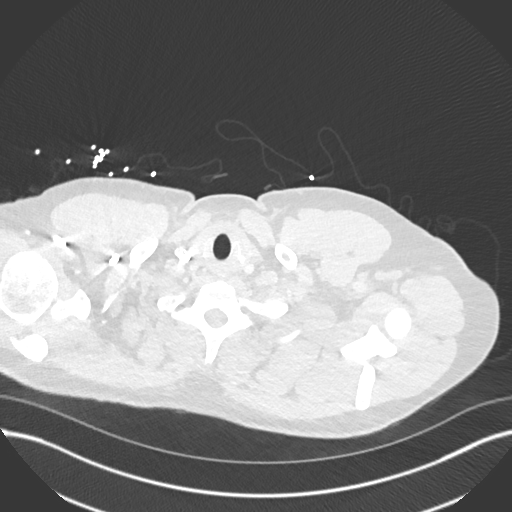

[Series 9: pe 2mm cor · coronal · 0.59mm/px · 3 of 151 slices shown]
[im 31/151  mediastinal]
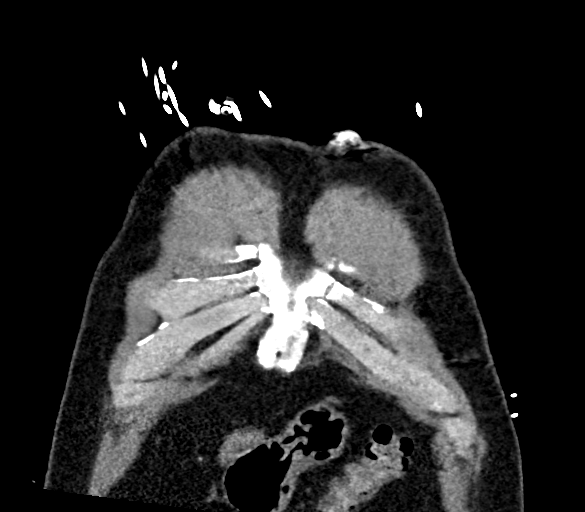
[im 61/151  mediastinal]
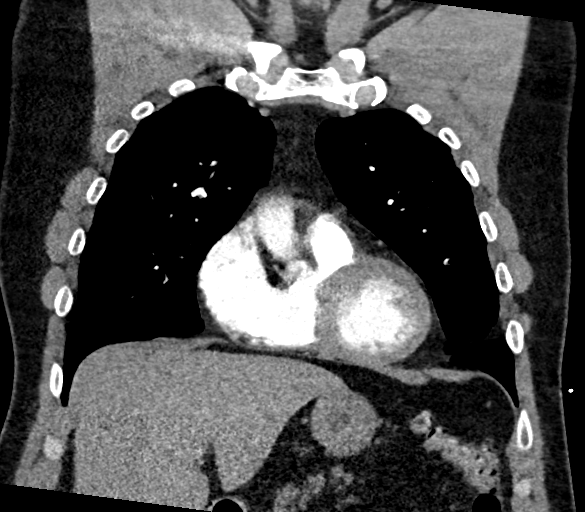
[im 91/151  mediastinal]
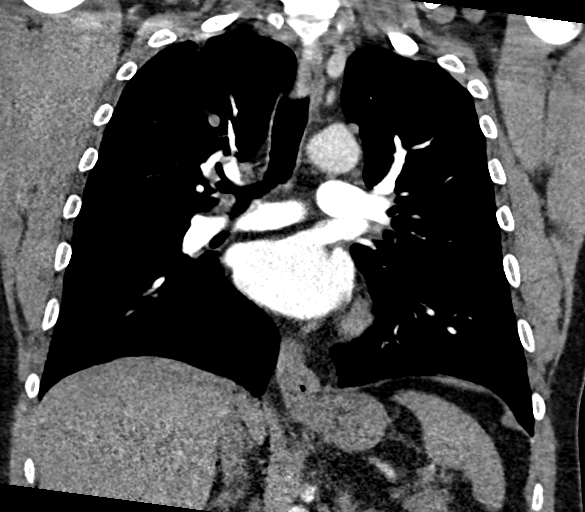

[16 of 36 positions shown; findings below may reference images not displayed]

FINDINGS: Cardiovascular: Aorta is of normal caliber. Heart size is normal
without pericardial effusion. No signs of atherosclerotic changes in
the thoracic aorta.

No evidence of pulmonary embolism.

Mediastinum/Nodes: No enlarged mediastinal, hilar, or axillary lymph
nodes. Thyroid gland, trachea, and esophagus demonstrate no
significant findings.

Lungs/Pleura: Mild nodularity along the azygos fissure as on the
previous study.

(Image 33, series 7) 9 mm area of nodularity is the largest discrete
nodule and is unchanged.

Areas of ground-glass/mosaic attenuation in the upper lobes
bilaterally have developed since the previous chest CT of
[DATE]. Patchy ground-glass and nodularity in the right lung
base is new and associated with mild bronchial wall thickening.
Material present in left lower lobe bronchi on today's exam along
with ground-glass and mild septal thickening but no nodularity.
Large airways are patent.

Upper Abdomen: Low-density lesion in the upper pole the left kidney
approximately 3.2 cm, cyst in this location on a previous study. No
acute findings in the upper abdomen. Small hiatal hernia.

Musculoskeletal: No chest wall abnormality. No acute or significant
osseous findings. It has

Review of the MIP images confirms the above findings.
IMPRESSION: 1. No pulmonary embolism.
2. Signs of bronchial inflammation and air trapping with areas of
pneumonitis at the lung bases raising the question of aspiration.
Correlation with clinical signs of reflux is suggested.
3. Signs of granulomatous disease with calcified nodule and
partially calcified areas of nodularity along the azygos fissure.
4. Small hiatal hernia.
5. Signs of a cystic left renal lesion, incompletely imaged.
Suggestion of very subtle internal complexity with simple cyst image
in this location in [LV], potentially representing small amount of
blood products in this previously described simple cyst. This has
enlarged since the previous study suggest follow-up renal sonogram
for further assessment.
6. A call is out to the referring provider to discuss above
findings.

Aortic Atherosclerosis ([LV]-[LV]).

ADDENDUM:
These results were called by telephone at the time of interpretation
on [DATE] at [DATE] to provider DREY , who verbally
acknowledged these results.

*** End of Addendum ***
FINDINGS: Cardiovascular: Aorta is of normal caliber. Heart size is normal
without pericardial effusion. No signs of atherosclerotic changes in
the thoracic aorta.

No evidence of pulmonary embolism.

Mediastinum/Nodes: No enlarged mediastinal, hilar, or axillary lymph
nodes. Thyroid gland, trachea, and esophagus demonstrate no
significant findings.

Lungs/Pleura: Mild nodularity along the azygos fissure as on the
previous study.

(Image 33, series 7) 9 mm area of nodularity is the largest discrete
nodule and is unchanged.

Areas of ground-glass/mosaic attenuation in the upper lobes
bilaterally have developed since the previous chest CT of
[DATE]. Patchy ground-glass and nodularity in the right lung
base is new and associated with mild bronchial wall thickening.
Material present in left lower lobe bronchi on today's exam along
with ground-glass and mild septal thickening but no nodularity.
Large airways are patent.

Upper Abdomen: Low-density lesion in the upper pole the left kidney
approximately 3.2 cm, cyst in this location on a previous study. No
acute findings in the upper abdomen. Small hiatal hernia.

Musculoskeletal: No chest wall abnormality. No acute or significant
osseous findings. It has

Review of the MIP images confirms the above findings.
IMPRESSION: 1. No pulmonary embolism.
2. Signs of bronchial inflammation and air trapping with areas of
pneumonitis at the lung bases raising the question of aspiration.
Correlation with clinical signs of reflux is suggested.
3. Signs of granulomatous disease with calcified nodule and
partially calcified areas of nodularity along the azygos fissure.
4. Small hiatal hernia.
5. Signs of a cystic left renal lesion, incompletely imaged.
Suggestion of very subtle internal complexity with simple cyst image
in this location in [LV], potentially representing small amount of
blood products in this previously described simple cyst. This has
enlarged since the previous study suggest follow-up renal sonogram
for further assessment.
6. A call is out to the referring provider to discuss above
findings.

Aortic Atherosclerosis ([LV]-[LV]).

## 2019-10-02 IMAGING — DX DG CHEST 2V
2 series · 2 of 2 positions shown · non-contrast
Comparison: [DATE]

CLINICAL DATA: Shortness of breath

EXAM:
CHEST - 2 VIEW

[chest pa]
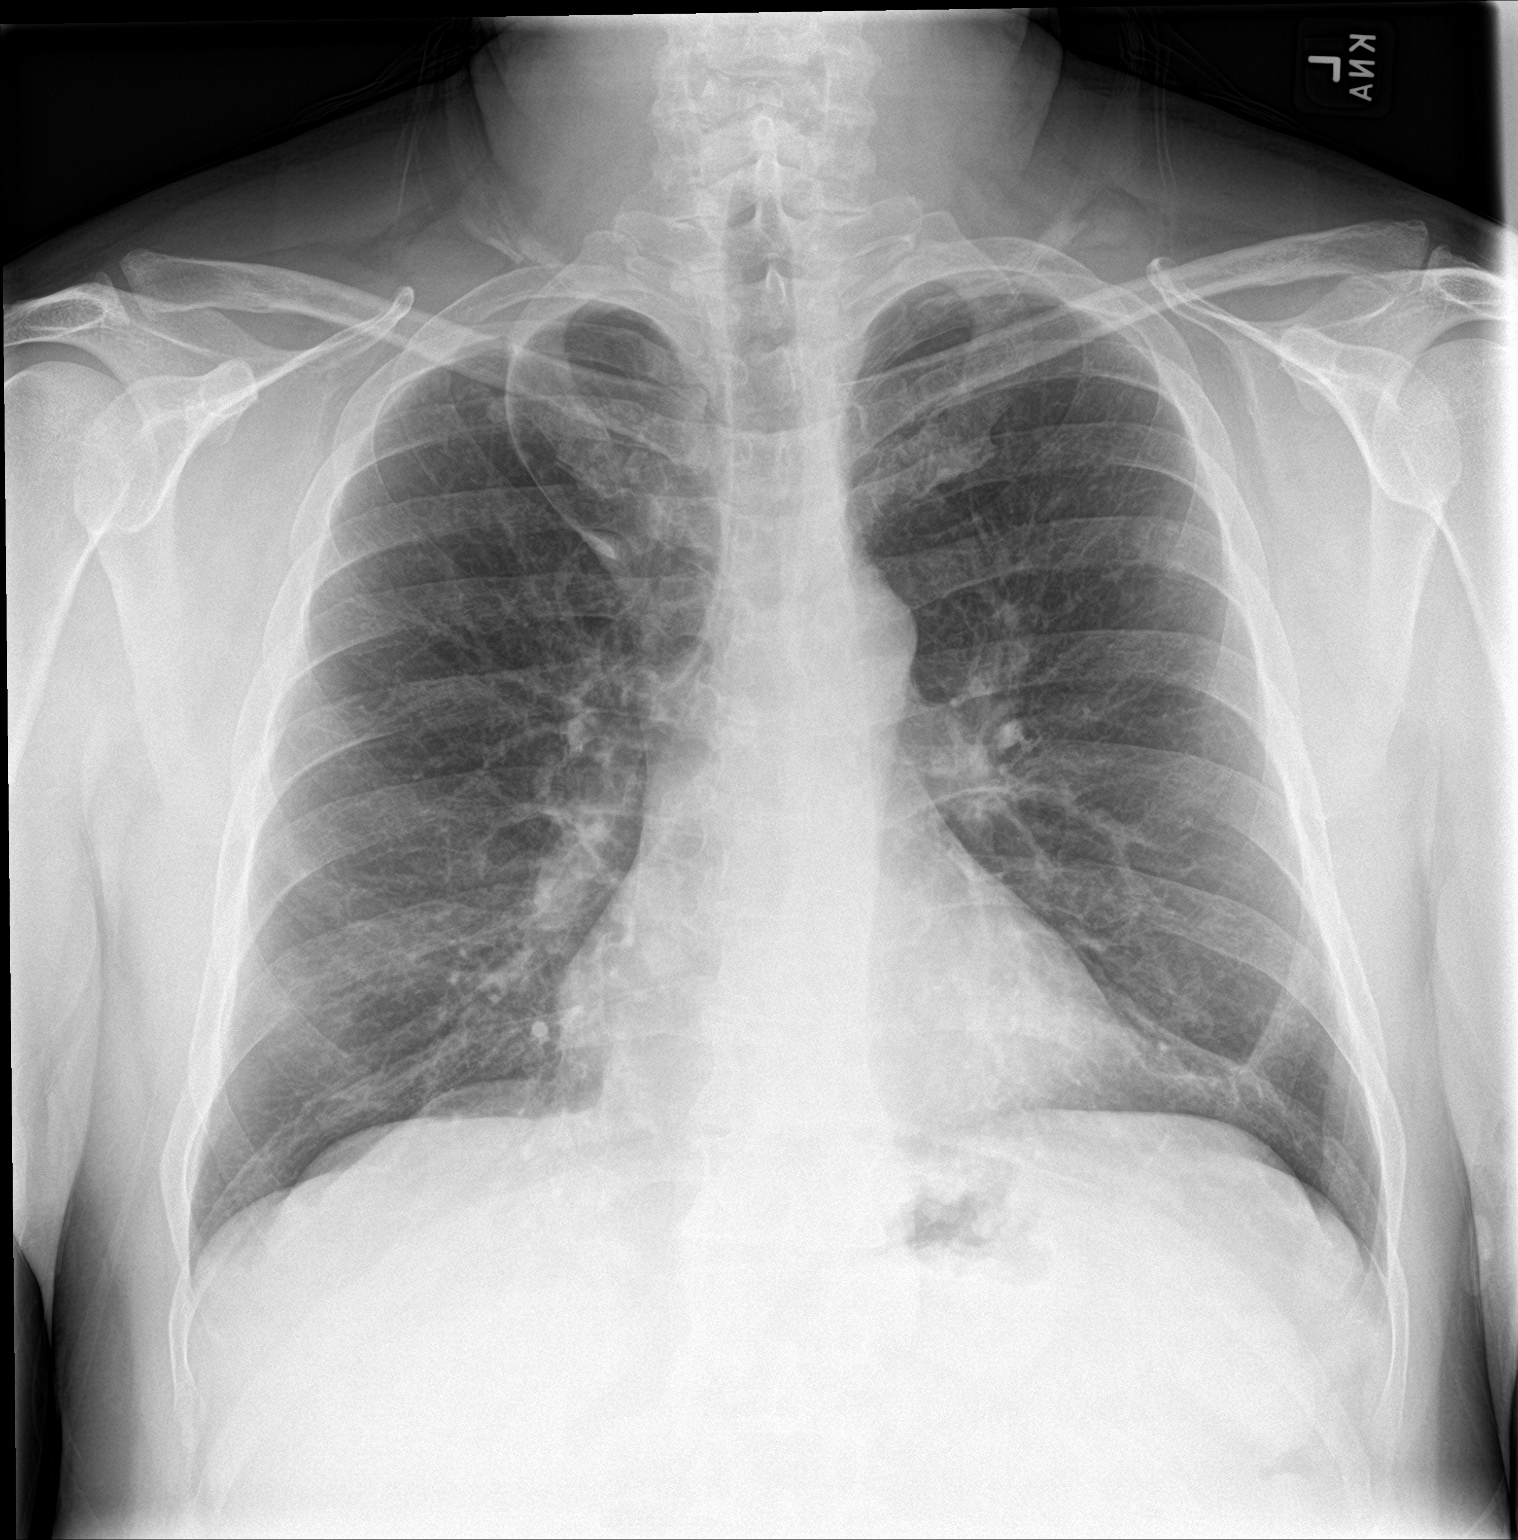

[chest lat]
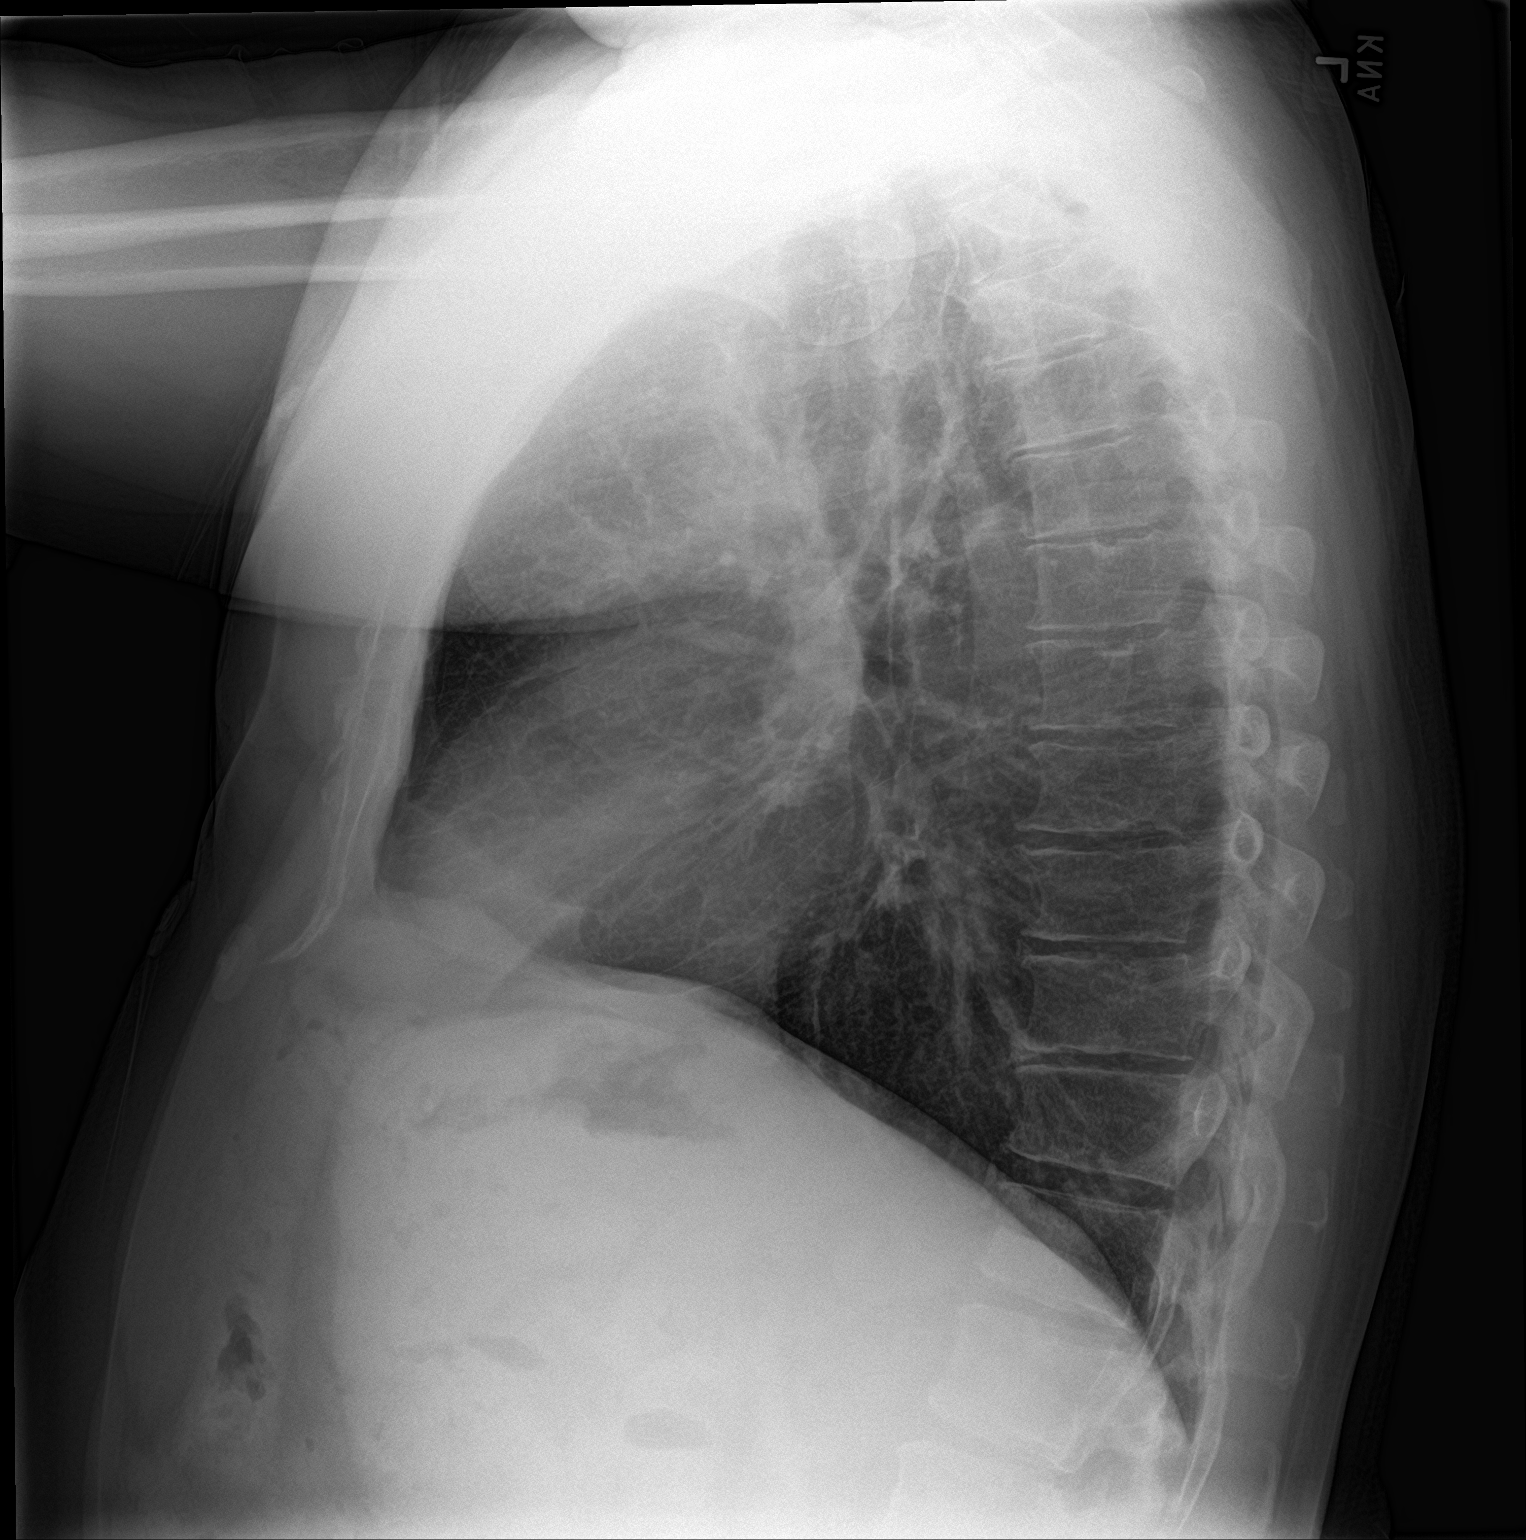

[2 of 2 positions shown; findings below may reference images not displayed]

FINDINGS: No new consolidation or edema. No pleural effusion or pneumothorax.
Stable cardiomediastinal contours with normal heart size. No acute
osseous abnormality.
IMPRESSION: No new findings.  No acute process in the chest

## 2019-10-02 MED ORDER — METHYLPREDNISOLONE SODIUM SUCC 125 MG IJ SOLR
125.0000 mg | Freq: Once | INTRAMUSCULAR | Status: AC
  Administered 2019-10-02: 125 mg via INTRAVENOUS
  Filled 2019-10-02: qty 2

## 2019-10-02 MED ORDER — IOHEXOL 350 MG/ML SOLN
100.0000 mL | Freq: Once | INTRAVENOUS | Status: AC | PRN
  Administered 2019-10-02: 100 mL via INTRAVENOUS

## 2019-10-02 MED ORDER — AEROCHAMBER PLUS FLO-VU MISC
1.0000 | Freq: Once | Status: AC
  Administered 2019-10-02: 1
  Filled 2019-10-02: qty 1

## 2019-10-02 MED ORDER — PREDNISONE 20 MG PO TABS: 60.0000 mg | ORAL_TABLET | Freq: Every day | ORAL | 0 refills | Status: AC

## 2019-10-02 MED ORDER — ALBUTEROL SULFATE HFA 108 (90 BASE) MCG/ACT IN AERS
4.0000 | INHALATION_SPRAY | Freq: Once | RESPIRATORY_TRACT | Status: AC
  Administered 2019-10-02: 4 via RESPIRATORY_TRACT
  Filled 2019-10-02: qty 6.7

## 2019-10-02 NOTE — ED Notes (Signed)
Help get patient undress on the monitor patient is resting with call bell in reach 

## 2019-10-02 NOTE — ED Triage Notes (Signed)
Patient complains of increasing SOB x 1 month. Has taken steroid and antibiotic with no improvement. On arrival sat 91% and speaking short sentences. Denies pain. Reports a negative xray and covid test 2 weeks

## 2019-10-02 NOTE — ED Provider Notes (Signed)
[facility] EMERGENCY DEPARTMENT Provider Note   CSN: [id number] Arrival date & time: 10/02/19  0935     History   Chief Complaint Chief Complaint  Patient presents with   Shortness of Breath   Cough    HPI Cody Tapia is a 53 y.o. male.     Cody Tapia is a 53 y.o. male with a history of hypertension, hyperlipidemia, GERD, asthma, anxiety, thoracic aortic aneurysm, who presents to the ED for evaluation of persistent shortness of breath.  Patient reports over the past 5 weeks he has had shortness of breath with associated wheezing and cough.  He was initially treated with a course of steroids, azithromycin and inhalers after having a negative Covid test and clear chest x-[place] he had some improvement in his symptoms, and felt like he was about 80-85% back to normal, but over the past few days he has started to feel much worse again reporting persistent shortness of breath that is worse with exertion he reports a frequent wheeze that is worse when he is laying down with associated dry cough.  No fevers.  Spite changing inhalers he is continued to have worsening symptoms.  Over the past 5 weeks he has had some improvement but has never felt like he is gotten back to baseline.  He denies any associated chest pain.  No lower extremity swelling.  No history of PE or DVT, no recent long distance travel or surgery.  Additional history obtained from chart review of patient's recent PCP telemedicine visits.     Past Medical History:  Diagnosis Date   23-polyvalent pneumococcal polysaccharide vaccine declined 7/14   Abnormal lung field    Anxiety    prior therapy, in remission   Asthma    Atypical chest pain    ED (erectile dysfunction)    GERD (gastroesophageal reflux disease)    GERD (gastroesophageal reflux disease)    Hypercholesteremia    Hyperglycemia    Hypertension    Obesity    Thoracic ascending aortic aneurysm ([place])    Tobacco  use    Wears glasses     Patient Active Problem List   Diagnosis Date Noted   Thoracic aortic aneurysm without rupture ([place]) 09/04/2019   Cough 09/04/2019   Chronic chest pain 10/18/2018   Wheezing 10/18/2018   Abnormal lung field 10/18/2018   Need for influenza vaccination 10/18/2018   Mood change 10/18/2018   Vaccine counseling 03/07/2018   Extrinsic asthma without complication [id number]   Chest tightness 02/22/2018   Abnormal PFT 02/22/2018   Rectal fissure 09/22/2017   Rectal pain 09/22/2017   Encounter for screening for malignant neoplasm of respiratory organs 01/14/2017   Polyp of colon 01/14/2017   Need for pneumococcal vaccination 01/14/2017   Throat discomfort 09/21/2016   Elevated blood-pressure reading without diagnosis of hypertension 09/21/2016   Upper airway cough syndrome 04/30/2016   Overweight 11/07/2015   Routine general medical examination at a health care facility 11/07/2015   Motion sickness 11/07/2015   Gastroesophageal reflux disease 11/07/2015   Erectile dysfunction 11/07/2015   History of prostatitis 11/07/2015   Cigarette smoker 10/22/2014   Class 1 obesity with serious comorbidity in adult 10/22/2014   Astigmatism 10/22/2011   Exotropia 10/22/2011   Ptosis of eyelid 10/22/2011    Past Surgical History:  Procedure Laterality Date   COLONOSCOPY  2009   [facility] for abdominal pain   ESOPHAGOGASTRODUODENOSCOPY  2009   [facility], abdominal pain   EYE  SURGERY     strabismus surgery, right   WISDOM TOOTH EXTRACTION          Home Medications    Prior to Admission medications   Medication Sig Start Date End Date Taking? Authorizing Provider  albuterol (VENTOLIN HFA) 108 (90 Base) MCG/ACT inhaler Inhale 2 puffs into the lungs every 6 (six) hours as needed for wheezing. 09/04/19   Tysinger, Camelia Eng, PA-C  budesonide-formoterol (SYMBICORT) 160-4.5 MCG/ACT inhaler Inhale 2 puffs into the lungs 2  (two) times daily. 09/29/19   Tysinger, Camelia Eng, PA-C  irbesartan (AVAPRO) 300 MG tablet Take 1 tablet (300 mg total) by mouth daily. 01/24/19   Adrian Prows, MD  Multiple Vitamin (MULTIVITAMIN) tablet Take 1 tablet by mouth daily.    [provider]  pantoprazole (PROTONIX) 40 MG tablet Take 1 tablet (40 mg total) by mouth daily. 09/04/19   Tysinger, Camelia Eng, PA-C  predniSONE (DELTASONE) 20 MG tablet Take 3 tablets (60 mg total) by mouth daily for 5 days. 10/02/19 10/07/19  Jacqlyn Larsen, PA-C  tadalafil (CIALIS) 20 MG tablet Take 1 tablet (20 mg total) by mouth daily. as directed 09/04/19   Tysinger, Camelia Eng, PA-C  valACYclovir (VALTREX) 1000 MG tablet Take 1,000 mg by mouth daily. 07/24/19   [provider]    Family History Family History  Problem Relation Age of Onset   Heart disease Father 22       stent, CAD   COPD Mother    Sudden death Neg Hx    Heart attack Neg Hx    Hyperlipidemia Neg Hx    Diabetes Neg Hx    Hypertension Neg Hx    Cancer Neg Hx    Stroke Neg Hx     Social History Social History   Tobacco Use   Smoking status: Current Some Day Smoker    Packs/day: 0.25    Years: 30.00    Pack years: 7.50    Types: Cigarettes   Smokeless tobacco: Never Used  Substance Use Topics   Alcohol use: Yes    Alcohol/week: 3.0 standard drinks    Types: 3 Glasses of wine per week   Drug use: No     Allergies   Chantix [varenicline tartrate] and Wellbutrin [bupropion]   Review of Systems Review of Systems  Constitutional: Negative for chills and fever.  HENT: Negative.   Respiratory: Positive for cough, shortness of breath and wheezing.   Cardiovascular: Negative for chest pain, palpitations and leg swelling.  Gastrointestinal: Negative for abdominal pain, diarrhea, nausea and vomiting.  Musculoskeletal: Negative for arthralgias and myalgias.  Skin: Negative for color change and rash.  Neurological: Negative for dizziness, syncope and  light-headedness.     Physical Exam Updated Vital Signs BP (!) 145/86 (BP Location: Left Arm)    Pulse 88    Temp 98 F (36.7 C) (Oral)    Resp 18    SpO2 91%   Physical Exam Vitals signs and nursing note reviewed.  Constitutional:      General: He is not in acute distress.    Appearance: He is well-developed and normal weight. He is not ill-appearing or diaphoretic.  HENT:     Head: Normocephalic and atraumatic.  Eyes:     General:        Right eye: No discharge.        Left eye: No discharge.     Pupils: Pupils are equal, round, and reactive to light.  Neck:  Musculoskeletal: Neck supple.  Cardiovascular:     Rate and Rhythm: Normal rate and regular rhythm.     Pulses: Normal pulses.     Heart sounds: Normal heart sounds. No murmur. No friction rub. No gallop.   Pulmonary:     Effort: Pulmonary effort is normal. No respiratory distress.     Breath sounds: Wheezing present. No rales.     Comments: At rest O2 sats ranging between 90-94% patient mildly tachypneic with some increased work of breathing, able to speak in short sentences.  On exam he has some faint wheezing throughout bilateral lung fields but good air movement. Chest:     Chest wall: No tenderness.  Abdominal:     General: Bowel sounds are normal. There is no distension.     Palpations: Abdomen is soft. There is no mass.     Tenderness: There is no abdominal tenderness. There is no guarding.     Comments: Abdomen soft, nondistended, nontender to palpation in all quadrants without guarding or peritoneal signs  Musculoskeletal:        General: No deformity.     Right lower leg: No edema.     Left lower leg: No edema.  Skin:    General: Skin is warm and dry.     Capillary Refill: Capillary refill takes less than 2 seconds.  Neurological:     Mental Status: He is alert.     Coordination: Coordination normal.     Comments: Speech is clear, able to follow commands Moves extremities without ataxia,  coordination intact  Psychiatric:        Mood and Affect: Mood normal.        Behavior: Behavior normal.      ED Treatments / Results  Labs (all labs ordered are listed, but only abnormal results are displayed) Labs Reviewed  CBC WITH DIFFERENTIAL/PLATELET - Abnormal; Notable for the following components:      Result Value   Eosinophils Absolute 1.0 (*)    All other components within normal limits  BASIC METABOLIC PANEL - Abnormal; Notable for the following components:   Glucose, Bld 107 (*)    All other components within normal limits  SARS CORONAVIRUS 2 (TAT 6-24 HRS)  LACTIC ACID, PLASMA    EKG EKG Interpretation  Date/Time:  Monday October 02 2019 10:08:34 EST Ventricular Rate:  87 PR Interval:  158 QRS Duration: 86 QT Interval:  364 QTC Calculation: 438 R Axis:   74 Text Interpretation: Normal sinus rhythm Normal ECG Confirmed by Virgel Manifold [phone]) on 10/02/2019 11:31:11 AM   Radiology Dg Chest 2 View  Result Date: 10/02/2019 CLINICAL DATA:  Shortness of breath EXAM: CHEST - 2 VIEW COMPARISON:  September 11 2019 FINDINGS: No new consolidation or edema. No pleural effusion or pneumothorax. Stable cardiomediastinal contours with normal heart size. No acute osseous abnormality. IMPRESSION: No new findings.  No acute process in the chest Electronically Signed   By: Macy Mis M.D.   On: 10/02/2019 10:44   Ct Angio Chest Pe W And/or Wo Contrast  Addendum Date: 10/02/2019   ADDENDUM REPORT: 10/02/2019 13:16 ADDENDUM: These results were called by telephone at the time of interpretation on 10/02/2019 at 1:16 pm to provider [facility] , who verbally acknowledged these results. Electronically Signed   By: Zetta Bills M.D.   On: 10/02/2019 13:16   Result Date: 10/02/2019 CLINICAL DATA:  Shortness of breath, persistent shortness of breath for 5 weeks without improvement. EXAM: CT ANGIOGRAPHY CHEST WITH  CONTRAST TECHNIQUE: Multidetector CT imaging of the chest was  performed using the standard protocol during bolus administration of intravenous contrast. Multiplanar CT image reconstructions and MIPs were obtained to evaluate the vascular anatomy. CONTRAST:  164mL OMNIPAQUE IOHEXOL 350 MG/ML SOLN COMPARISON:  Chest x-[place] of the same date, CT chest of 11/18/2018 FINDINGS: Cardiovascular: Aorta is of normal caliber. Heart size is normal without pericardial effusion. No signs of atherosclerotic changes in the thoracic aorta. No evidence of pulmonary embolism. Mediastinum/Nodes: No enlarged mediastinal, hilar, or axillary lymph nodes. Thyroid gland, trachea, and esophagus demonstrate no significant findings. Lungs/Pleura: Mild nodularity along the azygos fissure as on the previous study. (Image 33, series 7) 9 mm area of nodularity is the largest discrete nodule and is unchanged. Areas of ground-glass/mosaic attenuation in the upper lobes bilaterally have developed since the previous chest CT of 11/18/2018. Patchy ground-glass and nodularity in the right lung base is new and associated with mild bronchial wall thickening. Material present in left lower lobe bronchi on today's exam along with ground-glass and mild septal thickening but no nodularity. Large airways are patent. Upper Abdomen: Low-density lesion in the upper pole the left kidney approximately 3.2 cm, cyst in this location on a previous study. No acute findings in the upper abdomen. Small hiatal hernia. Musculoskeletal: No chest wall abnormality. No acute or significant osseous findings. It has Review of the MIP images confirms the above findings. IMPRESSION: 1. No pulmonary embolism. 2. Signs of bronchial inflammation and air trapping with areas of pneumonitis at the lung bases raising the question of aspiration. Correlation with clinical signs of reflux is suggested. 3. Signs of granulomatous disease with calcified nodule and partially calcified areas of nodularity along the azygos fissure. 4. Small hiatal hernia. 5.  Signs of a cystic left renal lesion, incompletely imaged. Suggestion of very subtle internal complexity with simple cyst image in this location in 2009, potentially representing small amount of blood products in this previously described simple cyst. This has enlarged since the previous study suggest follow-up renal sonogram for further assessment. 6. A call is out to the referring provider to discuss above findings. Aortic Atherosclerosis (ICD10-I70.0). Electronically Signed: By: Zetta Bills M.D. On: 10/02/2019 13:04    Procedures Procedures (including critical care time)  Medications Ordered in ED Medications  albuterol (VENTOLIN HFA) 108 (90 Base) MCG/ACT inhaler 4 puff (4 puffs Inhalation Given 10/02/19 1205)  aerochamber plus with mask device 1 each (1 each Other Given 10/02/19 1208)  methylPREDNISolone sodium succinate (SOLU-MEDROL) 125 mg/2 mL injection 125 mg (125 mg Intravenous Given 10/02/19 1206)  iohexol (OMNIPAQUE) 350 MG/ML injection 100 mL (100 mLs Intravenous Contrast Given 10/02/19 1235)     Initial Impression / Assessment and Plan / ED Course  I have reviewed the triage vital signs and the nursing notes.  Pertinent labs & imaging results that were available during my care of the patient were reviewed by me and considered in my medical decision making (see chart for details).  53 year old male presents with 5 weeks of persistent shortness of breath which initially improved with steroids, antibiotics and bronchodilators but never completely resolved and now is worsening again.  Patient has had negative Covid tests and chest x-rays.  Even persistent symptoms at this point I think it is reasonable to get CTA in addition to lab work and repeat Covid test, chest x-[place] here is negative.  CTA will allow [place] to rule out blood clot given persistent symptoms that improved but did not completely resolve with  these treatments.  This will allow [place] to see if there is any pneumonia or evidence of  bronchitis or inflammation.  Patient has no associated chest pain, no lower extremity edema, and with wheezing on exam I have very low suspicion for cardiac etiology.  Will give IV steroids and albuterol while awaiting CT.  Labs show no leukocytosis and normal hemoglobin, no acute electrolyte derangements, lactic acid was ordered from triage and is negative.  CTA shows no evidence of PE does show evidence of bronchial inflammation and air trapping this in conjunction with patient's smoking history likely suggests some degree of chronic bronchitis, there is a left renal cyst noted that has increased in size from 2009, radiology recommends follow-up outpatient with ultrasound, and I have discussed this with the patient.  Discussed these results with the patient, he was able to ambulate in the department and maintained O2 sats greater than 90% without significantly increased work of breathing.  I feel that this is a recurrence of inflammation and a combination of chronic bronchitis and patient's asthma.  Will treat with burst of steroids and have him continue with Symbicort and albuterol.  Follow-up with PCP and pulmonology.  Strict return precautions discussed.  Covid test is pending and patient will continue to quarantine at home until results return.  Patient expresses understanding and agreement with plan.  Final Clinical Impressions(s) / ED Diagnoses   Final diagnoses:  Bronchitis  Shortness of breath  Cough    ED Discharge Orders         Ordered    predniSONE (DELTASONE) 20 MG tablet  Daily     10/02/19 1438           Jacqlyn Larsen, [place] 10/02/19 1519    Virgel Manifold, MD 10/03/19 [phone]

## 2019-10-02 NOTE — ED Notes (Signed)
Pt ambulated in the hallway with 02 saturations 90%. Breathing unlabored. Strict return precautions given by PA and myself.

## 2019-10-02 NOTE — Discharge Instructions (Signed)
Please take steroids each morning with food for the next 5 days, continue using your Symbicort and use albuterol every 4 hours for the next 24 hours and then as needed.  Please follow-up with your PCP and with Dr. Melvyn Novas with pulmonology.  Return to the ED for any new or worsening symptoms.  Your CT showed signs of inflammation in your lungs.  It also showed a left-sided renal cyst that is increased from 2009, please follow-up with your PCP for this so they can do a renal ultrasound as an outpatient.

## 2019-10-02 NOTE — Telephone Encounter (Signed)
Pt called in first thing this morning Monday, advised to go to the ED for dyspnea, shortness of breath, not improved over the weekend despite recommendations discussed this past [place].  Very concerned and winded on the phone.

## 2019-10-03 ENCOUNTER — Telehealth: Payer: Self-pay | Admitting: Medical

## 2019-10-03 NOTE — Telephone Encounter (Signed)
I reviewed his ED visit notes, and I know he has had a difficult time with his breathing.  Thankfully there was no blood clot or no other life threatening issue.  I recommend a follow up with pulmonology, Dr. Melvyn Novas in 2-3 weeks.    Give him the phone number to call and schedule.

## 2019-10-03 NOTE — Telephone Encounter (Signed)
Pt informed. He wants to know if you want him to start the home nebulizer or should he continue the inhalers and steroids.

## 2019-10-03 NOTE — Telephone Encounter (Signed)
lmom for patient to detail message but also asked patient to give [place] a call in case he has additional questions

## 2019-10-03 NOTE — Telephone Encounter (Signed)
If he received nebulized albuterol in the emergency department and found this to be helpful then we can call this out.  Please ask?  The differences he would use nebulized albuterol in place of the handheld puffer albuterol when he is at home.  He can still use albuterol handheld puffer when out and about not at home.  They are interchangeable to be used every 4-6 hours as needed for shortness of breath wheezing or coughing spells.  Albuterol is a rescue emergency medication.  If desired call out nebulizer machine x1, albuterol for nebulizer solution the 0.083%, 90 mL, 1 treatment every 4-6 hours, 1 refill

## 2019-10-04 ENCOUNTER — Ambulatory Visit: Payer: Self-pay | Admitting: Cardiology

## 2019-10-11 ENCOUNTER — Telehealth: Payer: Self-pay

## 2019-10-11 ENCOUNTER — Other Ambulatory Visit: Payer: Self-pay | Admitting: Medical

## 2019-10-11 MED ORDER — ALBUTEROL SULFATE (2.5 MG/3ML) 0.083% IN NEBU: 2.5000 mg | INHALATION_SOLUTION | Freq: Four times a day (QID) | RESPIRATORY_TRACT | 1 refills | Status: DC | PRN

## 2019-10-11 NOTE — Telephone Encounter (Signed)
I sent the albuterol liquid medication to the pharmacy for neb use.  Please call out or set him up for nebulizer machine, tubing, equipment through supplier.

## 2019-10-11 NOTE — Telephone Encounter (Signed)
Pt. Called stating that you have been seeing him for asthma and bronchitis and you guys had discussed him getting a in home nebulizer machine if your nurse needs to call him about they can at [phone] pt. Would like in home nebulizer now ready for it now.

## 2019-10-12 ENCOUNTER — Encounter: Payer: BC Managed Care – PPO | Admitting: Medical

## 2019-10-12 NOTE — Telephone Encounter (Signed)
Done

## 2019-10-12 NOTE — Telephone Encounter (Signed)
This was routed back to me.  Please call out nebulizer, tubing, equipment to either [facility] or other home health supplier and thus we distribute those items from here through a different vendor

## 2019-10-15 ENCOUNTER — Emergency Department (HOSPITAL_COMMUNITY): Payer: BC Managed Care – PPO

## 2019-10-15 ENCOUNTER — Other Ambulatory Visit: Payer: Self-pay

## 2019-10-15 ENCOUNTER — Encounter (HOSPITAL_COMMUNITY): Payer: Self-pay | Admitting: Emergency Medicine

## 2019-10-15 ENCOUNTER — Inpatient Hospital Stay (HOSPITAL_COMMUNITY)
Admission: EM | Admit: 2019-10-15 | Discharge: 2019-10-17 | DRG: 189 | Disposition: A | Discharge status: Home / Self Care | Payer: BC Managed Care – PPO | Attending: Internal Medicine | Admitting: Internal Medicine

## 2019-10-15 DIAGNOSIS — Z8249 Family history of ischemic heart disease and other diseases of the circulatory system: Secondary | ICD-10-CM

## 2019-10-15 DIAGNOSIS — Z20828 Contact with and (suspected) exposure to other viral communicable diseases: Secondary | ICD-10-CM | POA: Diagnosis not present

## 2019-10-15 DIAGNOSIS — Z888 Allergy status to other drugs, medicaments and biological substances status: Secondary | ICD-10-CM | POA: Diagnosis not present

## 2019-10-15 DIAGNOSIS — Z79899 Other long term (current) drug therapy: Secondary | ICD-10-CM

## 2019-10-15 DIAGNOSIS — Z720 Tobacco use: Secondary | ICD-10-CM | POA: Diagnosis present

## 2019-10-15 DIAGNOSIS — N281 Cyst of kidney, acquired: Secondary | ICD-10-CM | POA: Diagnosis present

## 2019-10-15 DIAGNOSIS — Z825 Family history of asthma and other chronic lower respiratory diseases: Secondary | ICD-10-CM

## 2019-10-15 DIAGNOSIS — Z7951 Long term (current) use of inhaled steroids: Secondary | ICD-10-CM | POA: Diagnosis not present

## 2019-10-15 DIAGNOSIS — E669 Obesity, unspecified: Secondary | ICD-10-CM | POA: Diagnosis not present

## 2019-10-15 DIAGNOSIS — I10 Essential (primary) hypertension: Secondary | ICD-10-CM | POA: Diagnosis present

## 2019-10-15 DIAGNOSIS — J9601 Acute respiratory failure with hypoxia: Secondary | ICD-10-CM | POA: Diagnosis not present

## 2019-10-15 DIAGNOSIS — K219 Gastro-esophageal reflux disease without esophagitis: Secondary | ICD-10-CM | POA: Diagnosis present

## 2019-10-15 DIAGNOSIS — N529 Male erectile dysfunction, unspecified: Secondary | ICD-10-CM | POA: Diagnosis not present

## 2019-10-15 DIAGNOSIS — F17210 Nicotine dependence, cigarettes, uncomplicated: Secondary | ICD-10-CM | POA: Diagnosis present

## 2019-10-15 DIAGNOSIS — Z23 Encounter for immunization: Secondary | ICD-10-CM | POA: Diagnosis not present

## 2019-10-15 DIAGNOSIS — Z9981 Dependence on supplemental oxygen: Secondary | ICD-10-CM

## 2019-10-15 DIAGNOSIS — R0602 Shortness of breath: Secondary | ICD-10-CM | POA: Diagnosis not present

## 2019-10-15 DIAGNOSIS — F419 Anxiety disorder, unspecified: Secondary | ICD-10-CM | POA: Diagnosis present

## 2019-10-15 DIAGNOSIS — J441 Chronic obstructive pulmonary disease with (acute) exacerbation: Secondary | ICD-10-CM

## 2019-10-15 DIAGNOSIS — J189 Pneumonia, unspecified organism: Secondary | ICD-10-CM | POA: Diagnosis not present

## 2019-10-15 DIAGNOSIS — J45909 Unspecified asthma, uncomplicated: Secondary | ICD-10-CM | POA: Diagnosis not present

## 2019-10-15 DIAGNOSIS — Z6831 Body mass index (BMI) 31.0-31.9, adult: Secondary | ICD-10-CM | POA: Diagnosis not present

## 2019-10-15 LAB — COMPREHENSIVE METABOLIC PANEL
ALT: 32 U/L (ref 0–44)
AST: 25 U/L (ref 15–41)
Albumin: 4.1 g/dL (ref 3.5–5.0)
Alkaline Phosphatase: 118 U/L (ref 38–126)
Anion gap: 10 (ref 5–15)
BUN: 15 mg/dL (ref 6–20)
CO2: 24 mmol/L (ref 22–32)
Calcium: 9.5 mg/dL (ref 8.9–10.3)
Chloride: 102 mmol/L (ref 98–111)
Creatinine, Ser: 1.17 mg/dL (ref 0.61–1.24)
GFR calc Af Amer: >60 mL/min (ref >60)
GFR calc non Af Amer: >60 mL/min (ref >60)
Glucose, Bld: 104 mg/dL — ABNORMAL HIGH (ref 70–99)
Potassium: 4 mmol/L (ref 3.5–5.1)
Sodium: 136 mmol/L (ref 135–145)
Total Bilirubin: 0.7 mg/dL (ref 0.3–1.2)
Total Protein: 7.6 g/dL (ref 6.5–8.1)

## 2019-10-15 LAB — CBC WITH DIFFERENTIAL/PLATELET
Abs Immature Granulocytes: 0.09 10*3/uL — ABNORMAL HIGH (ref 0.00–0.07)
Basophils Absolute: 0 10*3/uL (ref 0.0–0.1)
Basophils Relative: 0 %
Eosinophils Absolute: 0.8 10*3/uL — ABNORMAL HIGH (ref 0.0–0.5)
Eosinophils Relative: 5 %
HCT: 48.2 % (ref 39.0–52.0)
Hemoglobin: 16.3 g/dL (ref 13.0–17.0)
Immature Granulocytes: 1 %
Lymphocytes Relative: 10 %
Lymphs Abs: 1.6 10*3/uL (ref 0.7–4.0)
MCH: 31.2 pg (ref 26.0–34.0)
MCHC: 33.8 g/dL (ref 30.0–36.0)
MCV: 92.3 fL (ref 80.0–100.0)
Monocytes Absolute: 1.3 10*3/uL — ABNORMAL HIGH (ref 0.1–1.0)
Monocytes Relative: 8 %
Neutro Abs: 12.5 10*3/uL — ABNORMAL HIGH (ref 1.7–7.7)
Neutrophils Relative %: 76 %
Platelets: 290 10*3/uL (ref 150–400)
RBC: 5.22 MIL/uL (ref 4.22–5.81)
RDW: 12.7 % (ref 11.5–15.5)
WBC: 16.1 10*3/uL — ABNORMAL HIGH (ref 4.0–10.5)
nRBC: 0 % (ref 0.0–0.2)

## 2019-10-15 LAB — POC SARS CORONAVIRUS 2 AG -  ED: SARS Coronavirus 2 Ag: NEGATIVE

## 2019-10-15 IMAGING — DX DG CHEST 2V
2 series · 2 of 2 positions shown · non-contrast
Comparison: [DATE]

CLINICAL DATA: Shortness of breath.

EXAM:
CHEST - 2 VIEW

[chest pa]
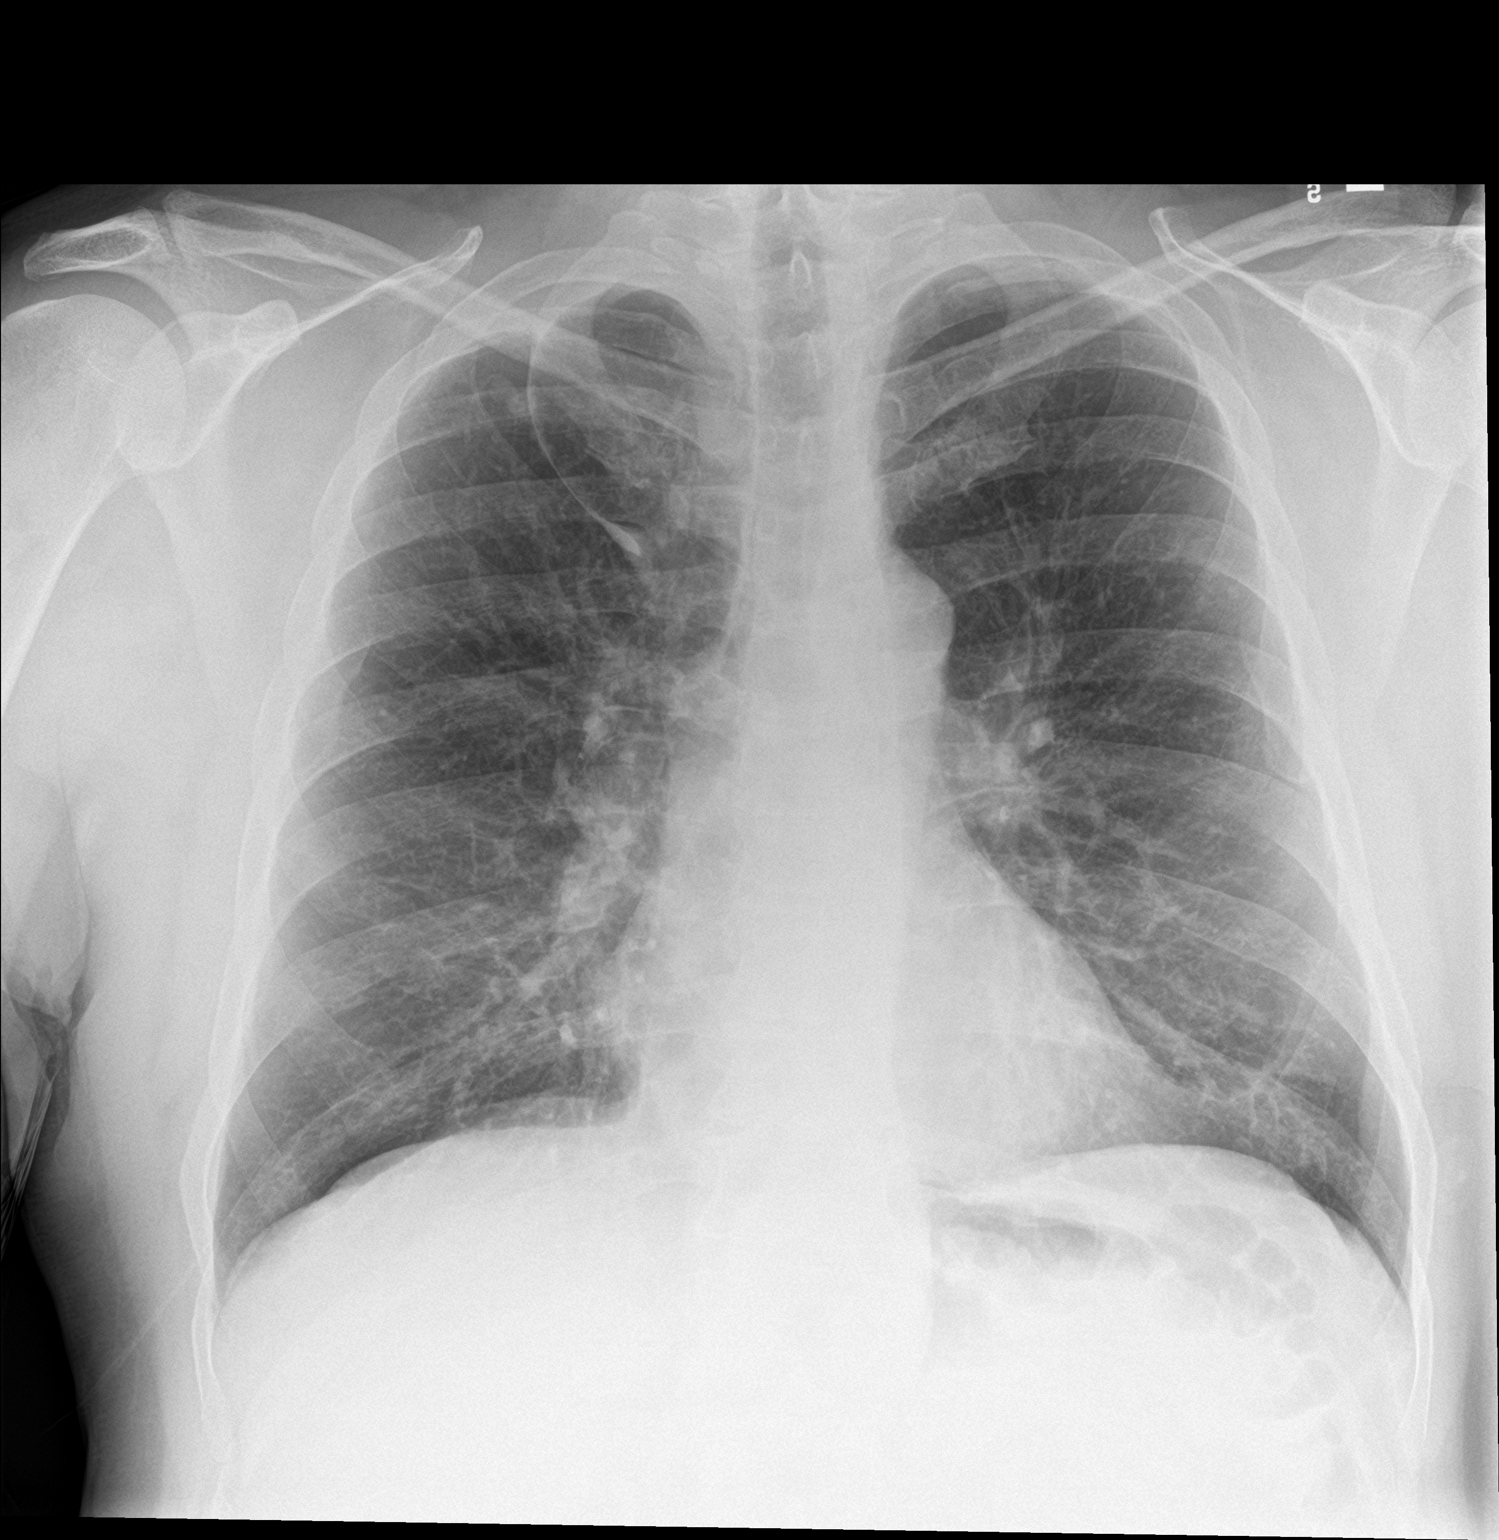

[chest lat]
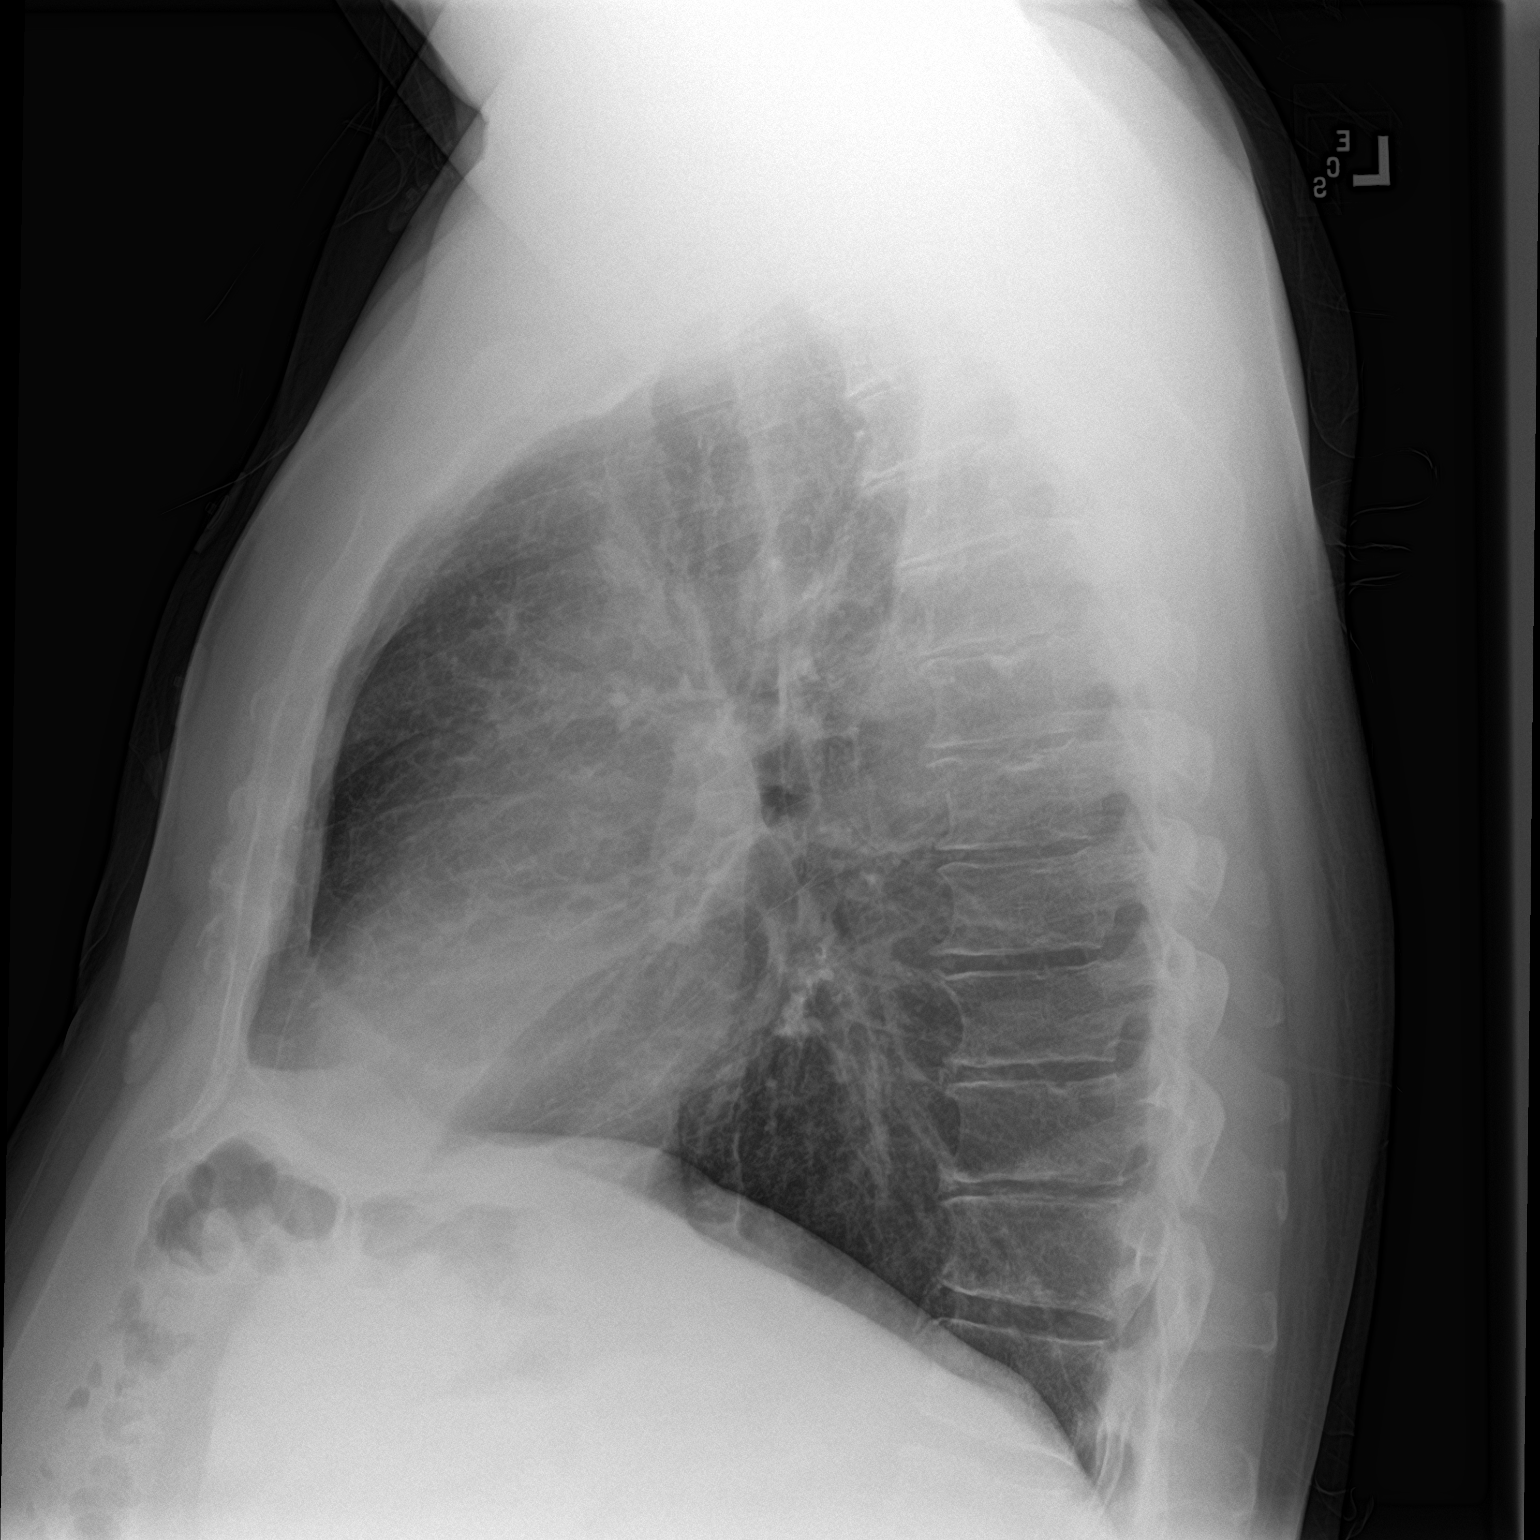

[2 of 2 positions shown; findings below may reference images not displayed]

FINDINGS: Again noted is an azygos lobe, a normal variant. There is likely a
calcified granuloma in the right lung apex. There is no
pneumothorax. No focal infiltrate. No significant pleural effusion.
There is no acute osseous abnormality. The heart size is normal.
IMPRESSION: No active cardiopulmonary disease.

## 2019-10-15 MED ORDER — NICOTINE 14 MG/24HR TD PT24
14.0000 mg | MEDICATED_PATCH | Freq: Every day | TRANSDERMAL | Status: DC
  Administered 2019-10-16 – 2019-10-17 (×2): 14 mg via TRANSDERMAL
  Filled 2019-10-15 (×2): qty 1

## 2019-10-15 MED ORDER — METHYLPREDNISOLONE SODIUM SUCC 125 MG IJ SOLR
60.0000 mg | Freq: Two times a day (BID) | INTRAMUSCULAR | Status: DC
  Administered 2019-10-16 – 2019-10-17 (×3): 60 mg via INTRAVENOUS
  Filled 2019-10-15 (×3): qty 2

## 2019-10-15 MED ORDER — MAGNESIUM SULFATE 2 GM/50ML IV SOLN
2.0000 g | Freq: Once | INTRAVENOUS | Status: AC
  Administered 2019-10-15: 2 g via INTRAVENOUS
  Filled 2019-10-15: qty 50

## 2019-10-15 MED ORDER — MOMETASONE FURO-FORMOTEROL FUM 200-5 MCG/ACT IN AERO
2.0000 | INHALATION_SPRAY | Freq: Two times a day (BID) | RESPIRATORY_TRACT | Status: DC
  Administered 2019-10-16 – 2019-10-17 (×2): 2 via RESPIRATORY_TRACT
  Filled 2019-10-15: qty 8.8

## 2019-10-15 MED ORDER — ENOXAPARIN SODIUM 40 MG/0.4ML ~~LOC~~ SOLN
40.0000 mg | SUBCUTANEOUS | Status: DC
  Administered 2019-10-15 – 2019-10-16 (×2): 40 mg via SUBCUTANEOUS
  Filled 2019-10-15 (×2): qty 0.4

## 2019-10-15 MED ORDER — ONDANSETRON HCL 4 MG PO TABS: 4.0000 mg | ORAL_TABLET | Freq: Four times a day (QID) | ORAL | Status: DC | PRN

## 2019-10-15 MED ORDER — ZOLPIDEM TARTRATE 5 MG PO TABS
5.0000 mg | ORAL_TABLET | Freq: Once | ORAL | Status: AC
  Administered 2019-10-16: 5 mg via ORAL
  Filled 2019-10-15: qty 1

## 2019-10-15 MED ORDER — HYDROXYZINE HCL 10 MG PO TABS
10.0000 mg | ORAL_TABLET | Freq: Once | ORAL | Status: AC
  Administered 2019-10-15: 10 mg via ORAL
  Filled 2019-10-15 (×2): qty 1

## 2019-10-15 MED ORDER — PANTOPRAZOLE SODIUM 40 MG PO TBEC
40.0000 mg | DELAYED_RELEASE_TABLET | Freq: Every day | ORAL | Status: DC
  Administered 2019-10-16 – 2019-10-17 (×2): 40 mg via ORAL
  Filled 2019-10-15 (×2): qty 1

## 2019-10-15 MED ORDER — ALBUTEROL SULFATE HFA 108 (90 BASE) MCG/ACT IN AERS: 6.0000 | INHALATION_SPRAY | RESPIRATORY_TRACT | Status: DC

## 2019-10-15 MED ORDER — ACETAMINOPHEN 650 MG RE SUPP: 650.0000 mg | Freq: Four times a day (QID) | RECTAL | Status: DC | PRN

## 2019-10-15 MED ORDER — IRBESARTAN 300 MG PO TABS
300.0000 mg | ORAL_TABLET | Freq: Every day | ORAL | Status: DC
  Administered 2019-10-16 – 2019-10-17 (×2): 300 mg via ORAL
  Filled 2019-10-15 (×2): qty 1

## 2019-10-15 MED ORDER — ONDANSETRON HCL 4 MG/2ML IJ SOLN: 4.0000 mg | Freq: Four times a day (QID) | INTRAMUSCULAR | Status: DC | PRN

## 2019-10-15 MED ORDER — ALBUTEROL SULFATE (2.5 MG/3ML) 0.083% IN NEBU
2.5000 mg | INHALATION_SOLUTION | RESPIRATORY_TRACT | Status: DC
  Administered 2019-10-15 – 2019-10-16 (×4): 2.5 mg via RESPIRATORY_TRACT
  Filled 2019-10-15 (×6): qty 3

## 2019-10-15 MED ORDER — METHYLPREDNISOLONE SODIUM SUCC 125 MG IJ SOLR
125.0000 mg | Freq: Once | INTRAMUSCULAR | Status: AC
  Administered 2019-10-15: 125 mg via INTRAVENOUS
  Filled 2019-10-15: qty 2

## 2019-10-15 MED ORDER — AEROCHAMBER PLUS FLO-VU LARGE MISC
Status: AC
  Administered 2019-10-15: 1
  Filled 2019-10-15: qty 1

## 2019-10-15 MED ORDER — ALBUTEROL SULFATE HFA 108 (90 BASE) MCG/ACT IN AERS
6.0000 | INHALATION_SPRAY | Freq: Once | RESPIRATORY_TRACT | Status: AC
  Administered 2019-10-15: 6 via RESPIRATORY_TRACT
  Filled 2019-10-15: qty 6.7

## 2019-10-15 MED ORDER — ACETAMINOPHEN 325 MG PO TABS: 650.0000 mg | ORAL_TABLET | Freq: Four times a day (QID) | ORAL | Status: DC | PRN

## 2019-10-15 MED ORDER — IPRATROPIUM BROMIDE HFA 17 MCG/ACT IN AERS
2.0000 | INHALATION_SPRAY | Freq: Once | RESPIRATORY_TRACT | Status: AC
  Administered 2019-10-15: 2 via RESPIRATORY_TRACT
  Filled 2019-10-15: qty 12.9

## 2019-10-15 NOTE — H&P (Signed)
History and Physical    Cody Tapia [id number] DOB: Jun 16, 1966 DOA: 10/15/2019  PCP: Carlena Hurl, PA-C  Patient coming from: Home I have personally briefly reviewed patient's old medical records in [place]  Chief Complaint: Shortness of breath and wheezing  HPI: Cody Tapia is a 53 y.o. male with medical history significant of hypertension, anxiety, asthma/COPD, obesity, erectile dysfunction, GERD presents to emergency department due to worsening of shortness of breath and wheezing since 6 weeks.    Patient reports dry cough, exertional shortness of breath and wheezing-took p.o. antibiotics and steroids x2 which helped his symptoms for couple of days however his symptoms returns back after few days of finishing steroid course.  Reports that he could not sleep since couple of days at nighttime due to his cough/shortness of breath.  He denies fever, chills, chest pain, leg swelling, orthopnea, PND, headache, blurry vision, palpitation, nausea, vomiting, diarrhea, urinary or bowel changes.  Patient presented to [facility] 2 weeks ago and was diagnosed with bronchitis and discharged home on p.o. steroids.  CT angiogram was performed which came back negative for pulmonary embolism.  He smokes 1 pack of cigarettes-trying to quit, drinks alcohol occasionally, denies illicit drug use.  He had pulmonary function test on 01/09/2019 which showed minimal obstructive airway disease.  Patient uses albuterol and Symbicort at home.  Not on home oxygen.  ED Course: Upon arrival: Patient blood pressure elevated, hypoxic in 90s-placed on 4 L of oxygen via nasal cannula, chest x-Nosson is negative, and COVID-19: Negative.  Patient received IV Solu-Medrol, magnesium sulfate, albuterol breathing treatment in ED.  Review of Systems: As per HPI otherwise negative.    Past Medical History:  Diagnosis Date   23-polyvalent pneumococcal polysaccharide vaccine declined 7/14   Abnormal  lung field    Anxiety    prior therapy, in remission   Asthma    Atypical chest pain    ED (erectile dysfunction)    GERD (gastroesophageal reflux disease)    GERD (gastroesophageal reflux disease)    Hypercholesteremia    Hyperglycemia    Hypertension    Obesity    Thoracic ascending aortic aneurysm ([place])    Tobacco use    Wears glasses     Past Surgical History:  Procedure Laterality Date   COLONOSCOPY  2009   [facility] for abdominal pain   ESOPHAGOGASTRODUODENOSCOPY  2009   [facility], abdominal pain   EYE SURGERY     strabismus surgery, right   WISDOM TOOTH EXTRACTION       reports that he has been smoking cigarettes. He has a 7.50 pack-year smoking history. He has never used smokeless tobacco. He reports current alcohol use of about 3.0 standard drinks of alcohol per week. He reports that he does not use drugs.  Allergies  Allergen Reactions   Chantix [Varenicline Tartrate]    Wellbutrin [Bupropion]     Sleepwalking, intolerance    Family History  Problem Relation Age of Onset   Heart disease Father 53       stent, CAD   COPD Mother    Sudden death Neg Hx    Heart attack Neg Hx    Hyperlipidemia Neg Hx    Diabetes Neg Hx    Hypertension Neg Hx    Cancer Neg Hx    Stroke Neg Hx     Prior to Admission medications   Medication Sig Start Date End Date Taking? Authorizing Provider  albuterol (VENTOLIN HFA) 108 (  90 Base) MCG/ACT inhaler Inhale 2 puffs into the lungs every 6 (six) hours as needed for wheezing. 09/04/19  Yes Tysinger, Camelia Eng, PA-C  budesonide-formoterol (SYMBICORT) 160-4.5 MCG/ACT inhaler Inhale 2 puffs into the lungs 2 (two) times daily. 09/29/19  Yes Tysinger, Camelia Eng, PA-C  irbesartan (AVAPRO) 300 MG tablet Take 1 tablet (300 mg total) by mouth daily. 01/24/19  Yes Adrian Prows, MD  Multiple Vitamin (MULTIVITAMIN) tablet Take 1 tablet by mouth daily.   Yes [provider]  pantoprazole (PROTONIX) 40  MG tablet Take 1 tablet (40 mg total) by mouth daily. 09/04/19  Yes Tysinger, Camelia Eng, PA-C  tadalafil (CIALIS) 20 MG tablet Take 1 tablet (20 mg total) by mouth daily. as directed 09/04/19  Yes Tysinger, Camelia Eng, PA-C  valACYclovir (VALTREX) 1000 MG tablet Take 1,000 mg by mouth daily. 07/24/19  Yes [provider]  albuterol (PROVENTIL) (2.5 MG/3ML) 0.083% nebulizer solution Take 3 mLs (2.5 mg total) by nebulization every 6 (six) hours as needed for wheezing or shortness of breath. Patient not taking: Reported on 10/15/2019 10/11/19   Carlena Hurl, PA-C    Physical Exam: Vitals:   10/15/19 1730 10/15/19 1745 10/15/19 1900 10/15/19 1930  BP: (!) 149/91 (!) 144/93 (!) 149/101 (!) 145/92  Pulse: 92 93 96 97  Resp:   16   Temp:      TempSrc:      SpO2: 91% 91% 90% 91%    Constitutional: NAD, calm, comfortable, on 4 L of oxygen via nasal cannula, obese, communicating well. Eyes: PERRL, lids and conjunctivae normal ENMT: Mucous membranes are moist. Posterior pharynx clear of any exudate or lesions.Normal dentition.  Neck: normal, supple, no masses, no thyromegaly Respiratory: Diffuse mild expiratory wheezing noted.  No accessory muscle use.  Cardiovascular: Regular rate and rhythm, no murmurs / rubs / gallops. No extremity edema. 2+ pedal pulses. No carotid bruits.  Abdomen: no tenderness, no masses palpated. No hepatosplenomegaly. Bowel sounds positive.  Musculoskeletal: no clubbing / cyanosis. No joint deformity upper and lower extremities. Good ROM, no contractures. Normal muscle tone.  Skin: no rashes, lesions, ulcers. No induration Neurologic: CN 2-12 grossly intact. Sensation intact, DTR normal. Strength 5/5 in all 4.  Psychiatric: Normal judgment and insight. Alert and oriented x 3. Normal mood.    Labs on Admission: I have personally reviewed following labs and imaging studies  CBC: Recent Labs  Lab 10/15/19 1650  WBC 16.1*  NEUTROABS 12.5*  HGB 16.3  HCT 48.2   MCV 92.3  PLT [id number]   Basic Metabolic Panel: Recent Labs  Lab 10/15/19 1650  NA 136  K 4.0  CL 102  CO2 24  GLUCOSE 104*  BUN 15  CREATININE 1.17  CALCIUM 9.5   GFR: CrCl cannot be calculated (Unknown ideal weight.). Liver Function Tests: Recent Labs  Lab 10/15/19 1650  AST 25  ALT 32  ALKPHOS 118  BILITOT 0.7  PROT 7.6  ALBUMIN 4.1   No results for input(s): LIPASE, AMYLASE in the last 168 hours. No results for input(s): AMMONIA in the last 168 hours. Coagulation Profile: No results for input(s): INR, PROTIME in the last 168 hours. Cardiac Enzymes: No results for input(s): CKTOTAL, CKMB, CKMBINDEX, TROPONINI in the last 168 hours. BNP (last 3 results) No results for input(s): PROBNP in the last 8760 hours. HbA1C: No results for input(s): HGBA1C in the last 72 hours. CBG: No results for input(s): GLUCAP in the last 168 hours. Lipid Profile: No results for input(s):  CHOL, HDL, LDLCALC, TRIG, CHOLHDL, LDLDIRECT in the last 72 hours. Thyroid Function Tests: No results for input(s): TSH, T4TOTAL, FREET4, T3FREE, THYROIDAB in the last 72 hours. Anemia Panel: No results for input(s): VITAMINB12, FOLATE, FERRITIN, TIBC, IRON, RETICCTPCT in the last 72 hours. Urine analysis:    Component Value Date/Time   LABSPEC 1.015 03/07/2018 1412   BILIRUBINUR negative 03/07/2018 1412   BILIRUBINUR neg 01/14/2017 0835   KETONESUR negative 03/07/2018 1412   PROTEINUR negative 03/07/2018 1412   PROTEINUR neg 01/14/2017 0835   UROBILINOGEN negative 01/14/2017 0835   NITRITE Negative 03/07/2018 1412   NITRITE neg 01/14/2017 0835   LEUKOCYTESUR Negative 03/07/2018 1412    Radiological Exams on Admission: Dg Chest 2 View  Result Date: 10/15/2019 CLINICAL DATA:  Shortness of breath. EXAM: CHEST - 2 VIEW COMPARISON:  October 02, 2019 FINDINGS: Again noted is an azygos lobe, a normal variant. There is likely a calcified granuloma in the right lung apex. There is no pneumothorax.  No focal infiltrate. No significant pleural effusion. There is no acute osseous abnormality. The heart size is normal. IMPRESSION: No active cardiopulmonary disease. Electronically Signed   By: Constance Holster M.D.   On: 10/15/2019 15:00    EKG: Normal sinus rhythm, no ST elevation or depression noted.  Assessment/Plan Principal Problem:   Acute respiratory failure with hypoxia (HCC) Active Problems:   GERD (gastroesophageal reflux disease)   Tobacco abuse   Hypertension   Anxiety   Acute hypoxic respiratory failure: -Likely secondary from asthma/COPD exacerbation.  Chest x-Jaston negative, COVID-19: Negative. -Reviewed PFTs from 02/20 which showed minimal obstructive airway disease. -Currently patient is on 4 L of oxygen via nasal cannula, on continuous pulse ox -We will admit patient on the floor.  We will try to wean off of oxygen -Patient is afebrile, no leukocytosis -We will continue IV Solu-Medrol, albuterol every 4 hour. -Ordered incentive spirometry.  Hypertension: Blood pressure is elevated -We will continue ARB  Tobacco abuse: Counseled about cessation and he verbalized understanding. -Nicotine patch as per patient's request  Anxiety: -Patient requested something which helps him sleep at night -Ordered hydroxyzine 10 mg at bedtime  GERD: Stable -Continue Protonix.  Left renal cyst: Incidental finding noted on CT angiogram -Follow-up outpatient with renal ultrasound  DVT prophylaxis: TED/SCD/Lovenox Code Status: Full code Family Communication: Patient's fianc present at bedside.  Plan of care discussed with patient and his fiance in length and they verbalized understanding and agreed with it. Disposition Plan: Likely home in 1 to 2 days  consults called: None Admission status: Inpatient   Mckinley Jewel MD [facility] Pager [phone]  If 7PM-7AM, please contact night-coverage [email or web address] Password [facility]  10/15/2019, 8:54 PM

## 2019-10-15 NOTE — ED Triage Notes (Signed)
C/o SOB x 1 month.  States he gets better after taking steroid (which he finished last Saturday) and then SOB gets worse.  Speaking in short sentences.  States he no longer has a cough.  Diagnosed with bronchitis at last ED visit on 11/9.

## 2019-10-16 ENCOUNTER — Inpatient Hospital Stay (HOSPITAL_COMMUNITY): Payer: BC Managed Care – PPO

## 2019-10-16 ENCOUNTER — Other Ambulatory Visit (HOSPITAL_COMMUNITY): Payer: BC Managed Care – PPO

## 2019-10-16 DIAGNOSIS — F419 Anxiety disorder, unspecified: Secondary | ICD-10-CM

## 2019-10-16 DIAGNOSIS — K219 Gastro-esophageal reflux disease without esophagitis: Secondary | ICD-10-CM

## 2019-10-16 DIAGNOSIS — Z720 Tobacco use: Secondary | ICD-10-CM

## 2019-10-16 DIAGNOSIS — I10 Essential (primary) hypertension: Secondary | ICD-10-CM

## 2019-10-16 DIAGNOSIS — J9601 Acute respiratory failure with hypoxia: Principal | ICD-10-CM

## 2019-10-16 LAB — CBC
HCT: 46.2 % (ref 39.0–52.0)
Hemoglobin: 15.6 g/dL (ref 13.0–17.0)
MCH: 31.1 pg (ref 26.0–34.0)
MCHC: 33.8 g/dL (ref 30.0–36.0)
MCV: 92.2 fL (ref 80.0–100.0)
Platelets: 270 10*3/uL (ref 150–400)
RBC: 5.01 MIL/uL (ref 4.22–5.81)
RDW: 12.9 % (ref 11.5–15.5)
WBC: 11.2 10*3/uL — ABNORMAL HIGH (ref 4.0–10.5)
nRBC: 0 % (ref 0.0–0.2)

## 2019-10-16 LAB — BASIC METABOLIC PANEL
Anion gap: 9 (ref 5–15)
BUN: 17 mg/dL (ref 6–20)
CO2: 27 mmol/L (ref 22–32)
Calcium: 9.4 mg/dL (ref 8.9–10.3)
Chloride: 102 mmol/L (ref 98–111)
Creatinine, Ser: 1.06 mg/dL (ref 0.61–1.24)
GFR calc Af Amer: >60 mL/min (ref >60)
GFR calc non Af Amer: >60 mL/min (ref >60)
Glucose, Bld: 148 mg/dL — ABNORMAL HIGH (ref 70–99)
Potassium: 4.8 mmol/L (ref 3.5–5.1)
Sodium: 138 mmol/L (ref 135–145)

## 2019-10-16 LAB — HIV ANTIBODY (ROUTINE TESTING W REFLEX): HIV Screen 4th Generation wRfx: NONREACTIVE

## 2019-10-16 LAB — MAGNESIUM: Magnesium: 2.5 mg/dL — ABNORMAL HIGH (ref 1.7–2.4)

## 2019-10-16 LAB — PHOSPHORUS: Phosphorus: 3.8 mg/dL (ref 2.5–4.6)

## 2019-10-16 IMAGING — CT CT CHEST HIGH RESOLUTION W/O CM
2 of 7 series · 14 of 36 positions shown, 17 images · non-contrast
Comparison: CT chest angiogram, [DATE], CT chest, [DATE]

CLINICAL DATA: Shortness of breath

EXAM:
CT CHEST WITHOUT CONTRAST
TECHNIQUE: Multidetector CT imaging of the chest was performed following the
standard protocol without intravenous contrast. High resolution
imaging of the lungs, as well as inspiratory and expiratory imaging,
was performed.

[Series 6: coronals · coronal · 0.69mm/px · 3 of 158 slices shown]
[im 32/158  lung]
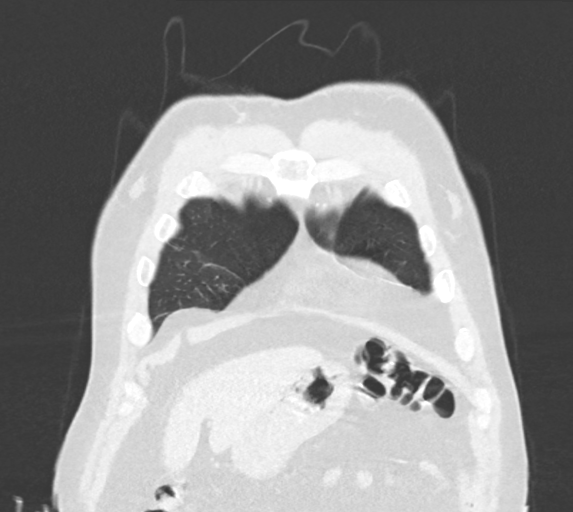
[im 63/158  lung]
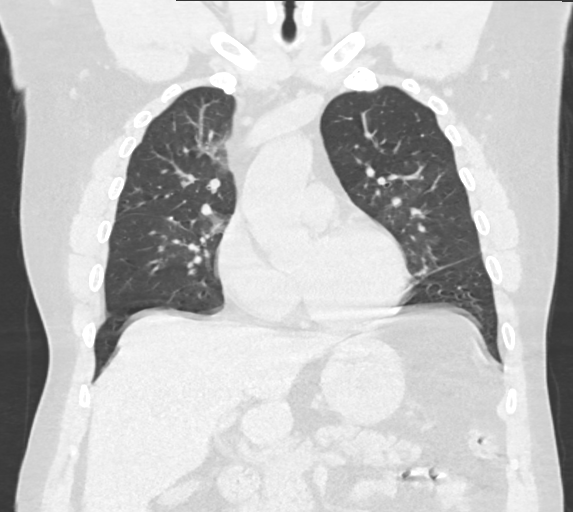
[im 95/158  lung]
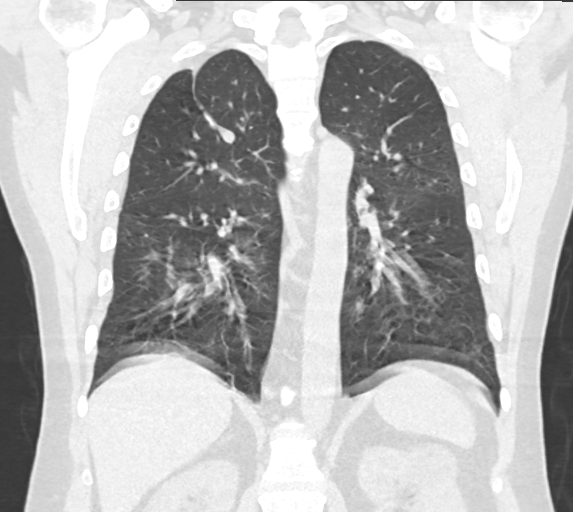

[Series 9: hi res thins · axial · 0.74mm/px · z∈[-382,-90]mm · 11 of 352 slices shown, 14 images]
[im 30/352  mediastinal]
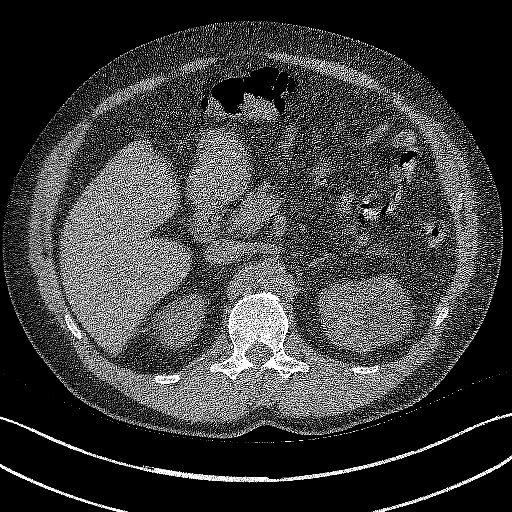
[im 30/352  lung]
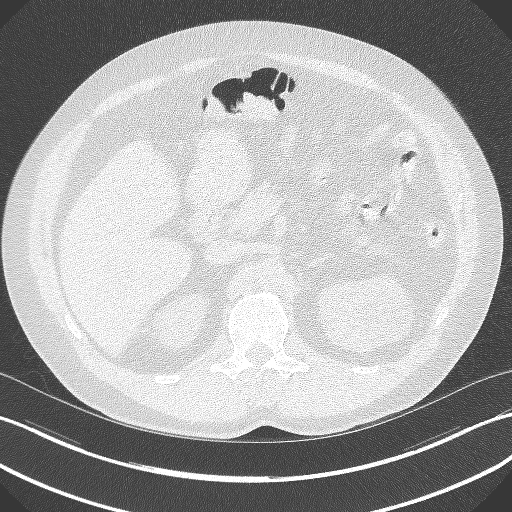
[im 59/352  lung]
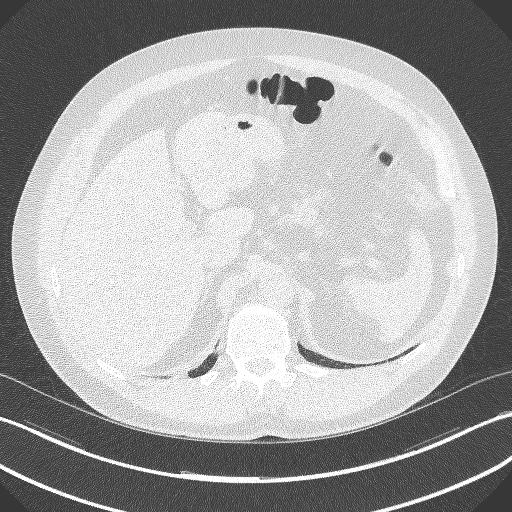
[im 88/352  lung]
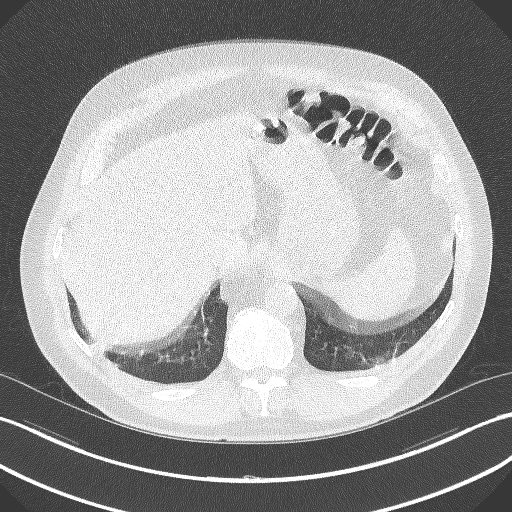
[im 118/352  lung]
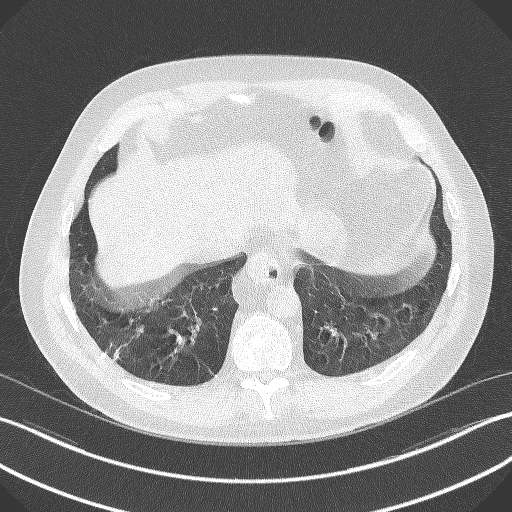
[im 147/352  mediastinal]
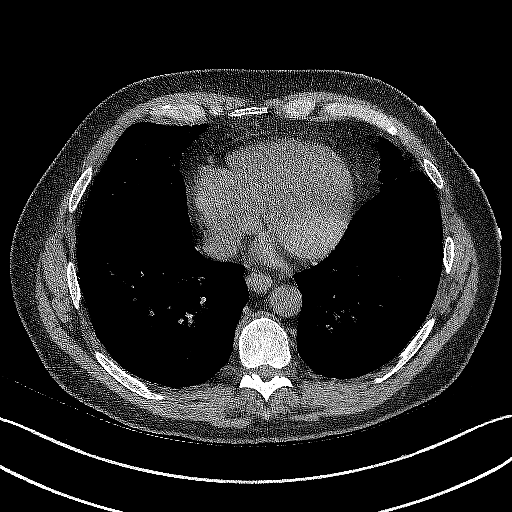
[im 147/352  lung]
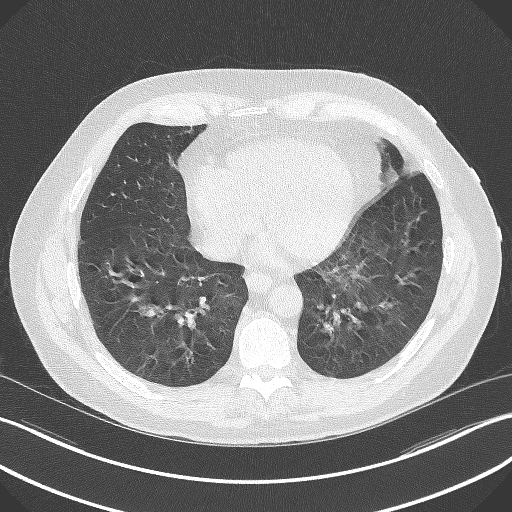
[im 176/352  lung]
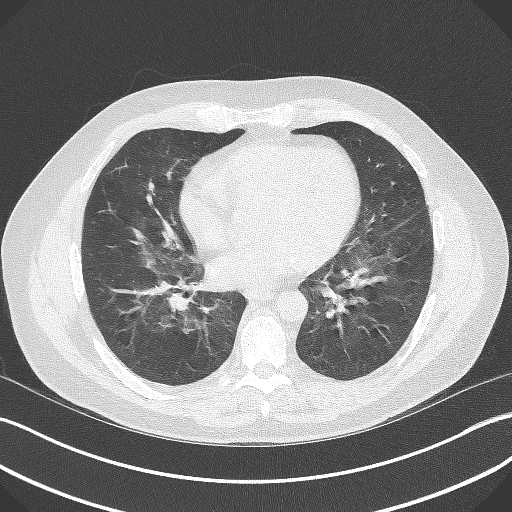
[im 205/352  lung]
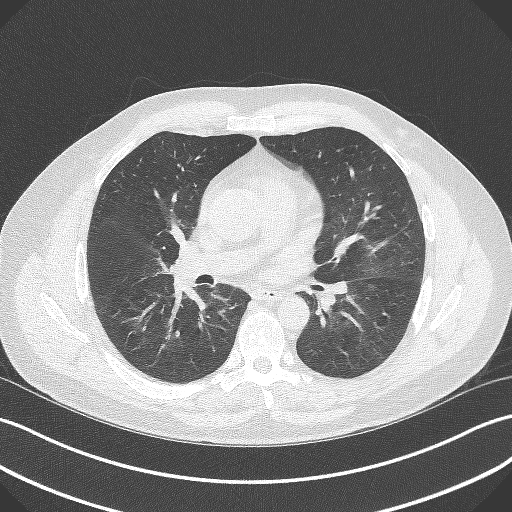
[im 235/352  lung]
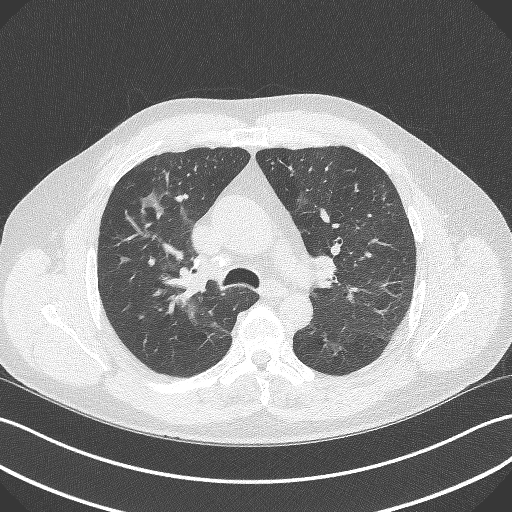
[im 264/352  mediastinal]
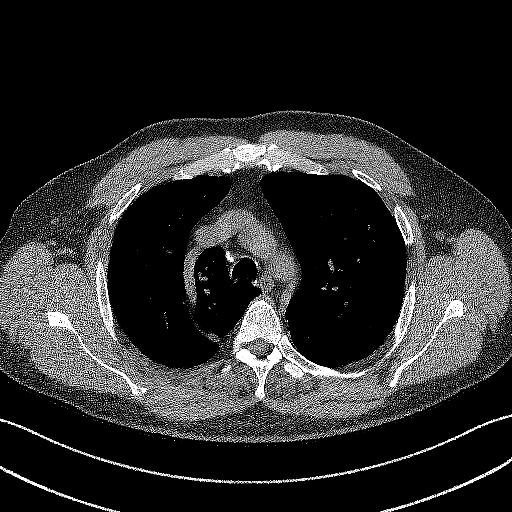
[im 264/352  lung]
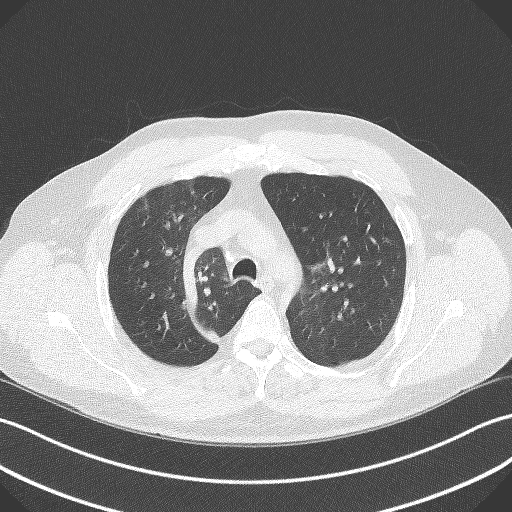
[im 293/352  lung]
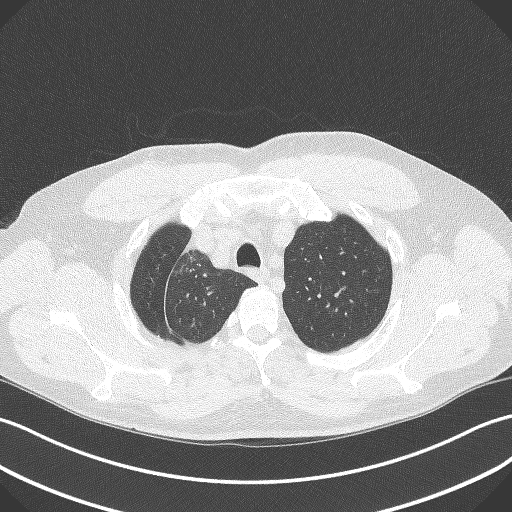
[im 322/352  lung]
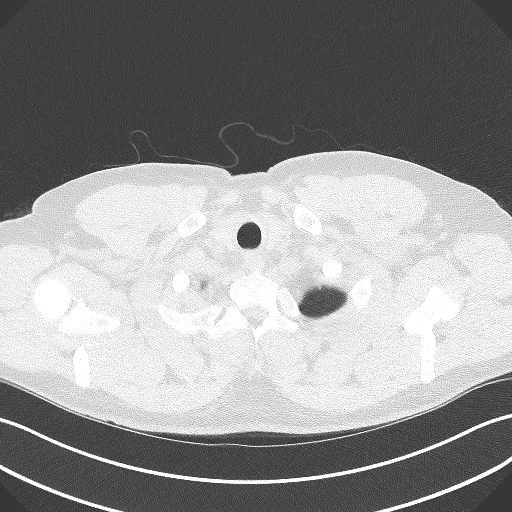

[14 of 36 positions shown; findings below may reference images not displayed]

FINDINGS: Cardiovascular: The tubular ascending thoracic aorta is enlarged,
measuring 4.2 x 4.1 cm, not significantly changed compared to prior
examinations. Normal heart size. No pericardial effusion.

Mediastinum/Nodes: No enlarged mediastinal, hilar, or axillary lymph
nodes. Calcified small right hilar lymph nodes. Thyroid gland,
trachea, and esophagus demonstrate no significant findings.

Lungs/Pleura: Examination is generally limited by breath motion
artifact. Within this limitation, there are scattered, generally
peribronchovascular and somewhat geographic ground-glass opacities
(series 10, image 96, 61). Mild bronchial wall thickening. No
significant air trapping on expiratory phase imaging. Small
redemonstrated benign calcified nodules of the right upper lobe
(series 10, image 34). No pleural effusion or pneumothorax.

Upper Abdomen: No acute abnormality.

Musculoskeletal: No chest wall mass or suspicious bone lesions
identified.
IMPRESSION: 1. Examination is generally limited by breath motion artifact.
Within this limitation, there are scattered, generally
peribronchovascular and somewhat geographic ground-glass opacities,
which are fluctuant in comparison to prior examination dated
[DATE] (series 10, image 96, 61). Mild bronchial wall
thickening. No significant air trapping on expiratory phase imaging.
These opacities are nonspecific and infectious or inflammatory given
fluctuant nature. No evidence of fibrotic interstitial lung disease.

2. Calcific stigma of prior granulomatous disease of the right lung.

3. The tubular ascending thoracic aorta is enlarged, measuring 4.2 x
4.1 cm, not significantly changed compared to prior examinations.

## 2019-10-16 MED ORDER — ALBUTEROL SULFATE (2.5 MG/3ML) 0.083% IN NEBU
2.5000 mg | INHALATION_SOLUTION | Freq: Three times a day (TID) | RESPIRATORY_TRACT | Status: DC
  Administered 2019-10-16 – 2019-10-17 (×2): 2.5 mg via RESPIRATORY_TRACT
  Filled 2019-10-16 (×2): qty 3

## 2019-10-16 MED ORDER — ZOLPIDEM TARTRATE 5 MG PO TABS
5.0000 mg | ORAL_TABLET | Freq: Every evening | ORAL | Status: DC | PRN
  Administered 2019-10-16: 5 mg via ORAL
  Filled 2019-10-16: qty 1

## 2019-10-16 NOTE — Progress Notes (Signed)
New Admission Note: ? Arrival Method: via bed  Mental Orientation: A/O x 4 Telemetry: Assessment: Completed Skin: Refer to flowsheet IV: Left AC Pain: 0/10 Tubes: Safety Measures: Safety Fall Prevention Plan discussed with patient. Admission: Completed 5 [place] Orientation: Patient has been orientated to the room, unit and the staff. Family: Orders have been reviewed and are being implemented. Will continue to monitor the patient. Call light has been placed within reach and bed alarm has been activated.  ? [employer], [place]

## 2019-10-16 NOTE — Plan of Care (Signed)
  Problem: Clinical Measurements: Goal: Ability to maintain clinical measurements within normal limits will improve Outcome: Progressing   

## 2019-10-16 NOTE — Plan of Care (Signed)
  Problem: Education: Goal: Knowledge of General Education information will improve Description Including pain rating scale, medication(s)/side effects and non-pharmacologic comfort measures Outcome: Progressing   

## 2019-10-16 NOTE — Progress Notes (Signed)
Cody Tapia [facility]  PROGRESS NOTE    Cody Tapia  [id number] DOB: June 28, 1966 DOA: 10/15/2019 PCP: Carlena Hurl, PA-C   Brief Narrative:    Cody Tapia is a 53 y.o. male with medical history significant of hypertension, anxiety, asthma/COPD, obesity, erectile dysfunction, GERD presents to emergency department due to worsening of shortness of breath and wheezing since 6 weeks.  Patient presented to [facility] 2 weeks ago and was diagnosed with bronchitis and discharged home on p.o. steroids.  CT angiogram was performed which came back negative for pulmonary embolism.  He smokes 1 pack of cigarettes-trying to quit, drinks alcohol occasionally, denies illicit drug use.  He had pulmonary function test on 01/09/2019 which showed minimal obstructive airway disease.  Patient uses albuterol and Symbicort at home.  Not on home oxygen.  11/23: Says he is still SOB this AM.    Assessment & Plan:   Principal Problem:   Acute respiratory failure with hypoxia ([place]) Active Problems:   GERD (gastroesophageal reflux disease)   Tobacco abuse   Hypertension   Anxiety  Acute hypoxic respiratory failure     - Chest x-Lyndol negative, COVID-19 negative     - CTA chest from 11/9: Signs of bronchial inflammation and air trapping with areas of pneumonitis at the lung bases raising the question of aspiration. Correlation with clinical signs of reflux is suggested.     - multiple visits in the last month-6weeks for same symptoms     - eval'd by pulm at beginning of the year for similar symptoms; was referred to [facility] voice center (did not follow up)     - not on O2 at home; currently on 4L [place], wean as able     - continue solumedrol, dulera, PRN albuterol     - continue IS     - have consulted SLP to eval for chronic silent aspiration; will go for MBS tomorrow     - have ordered hi-res CT chest     - have ordered echo; doesn't give the impression of volume overload at this time, but can not r/o cardiac  involvement just yet     - sidebarred Pulm/CC; if something shows up on HRCT, will call them for formal consult  Hypertension     - continue irbesartan  Tobacco abuse     - nicotine patch     - he says he's weaning tobacco use  Anxiety     - hydroxyzine qHS PRN  GERD     - continue protonix  Left renal cyst:      - Incidental finding noted on CT angiogram     - Follow-up outpatient with renal ultrasound  DVT prophylaxis: lovenox Code Status: FULL   Disposition Plan: TBD  Subjective: "Should you be testing for COVID antibodies?"  Objective: Vitals:   10/16/19 0153 10/16/19 0434 10/16/19 0723 10/16/19 1121  BP: (!) 148/92 (!) 150/96 (!) 144/77   Pulse: 95 94 95   Resp: 20 18 18    Temp: 98.4 F (36.9 C) 97.8 F (36.6 C) (!) 97.5 F (36.4 C)   TempSrc: Oral Oral Oral   SpO2: 94% 94% 92% 93%  Weight:  102.3 kg    Height: 5\' 11"  (1.803 m)       Intake/Output Summary (Last 24 hours) at 10/16/2019 1354 Last data filed at 10/16/2019 0900 Gross per 24 hour  Intake 590 ml  Output 0 ml  Net 590 ml   Filed Weights   10/16/19  0434  Weight: 102.3 kg    Examination:  General: 53 y.o. male resting in bed in NAD Cardiovascular: RRR, +S1, S2, no m/g/r, equal pulses throughout Respiratory: mild bilat exp wheeze noted with good air movement GI: BS+, NDNT, no masses noted, no organomegaly noted MSK: No e/c/c Neuro: A&O x 3, no focal deficits Psyc: Appropriate interaction and affect, calm/cooperative   Data Reviewed: I have personally reviewed following labs and imaging studies.  CBC: Recent Labs  Lab 10/15/19 1650 10/16/19 0714  WBC 16.1* 11.2*  NEUTROABS 12.5*  --   HGB 16.3 15.6  HCT 48.2 46.2  MCV 92.3 92.2  PLT 290 [id number]   Basic Metabolic Panel: Recent Labs  Lab 10/15/19 1650 10/16/19 0714  NA 136 138  K 4.0 4.8  CL 102 102  CO2 24 27  GLUCOSE 104* 148*  BUN 15 17  CREATININE 1.17 1.06  CALCIUM 9.5 9.4  MG  --  2.5*  PHOS  --  3.8    GFR: Estimated Creatinine Clearance: 100.4 mL/min (by C-G formula based on SCr of 1.06 mg/dL). Liver Function Tests: Recent Labs  Lab 10/15/19 1650  AST 25  ALT 32  ALKPHOS 118  BILITOT 0.7  PROT 7.6  ALBUMIN 4.1   No results for input(s): LIPASE, AMYLASE in the last 168 hours. No results for input(s): AMMONIA in the last 168 hours. Coagulation Profile: No results for input(s): INR, PROTIME in the last 168 hours. Cardiac Enzymes: No results for input(s): CKTOTAL, CKMB, CKMBINDEX, TROPONINI in the last 168 hours. BNP (last 3 results) No results for input(s): PROBNP in the last 8760 hours. HbA1C: No results for input(s): HGBA1C in the last 72 hours. CBG: No results for input(s): GLUCAP in the last 168 hours. Lipid Profile: No results for input(s): CHOL, HDL, LDLCALC, TRIG, CHOLHDL, LDLDIRECT in the last 72 hours. Thyroid Function Tests: No results for input(s): TSH, T4TOTAL, FREET4, T3FREE, THYROIDAB in the last 72 hours. Anemia Panel: No results for input(s): VITAMINB12, FOLATE, FERRITIN, TIBC, IRON, RETICCTPCT in the last 72 hours. Sepsis Labs: No results for input(s): PROCALCITON, LATICACIDVEN in the last 168 hours.  No results found for this or any previous visit (from the past 240 hour(s)).    Radiology Studies: Dg Chest 2 View  Result Date: 10/15/2019 CLINICAL DATA:  Shortness of breath. EXAM: CHEST - 2 VIEW COMPARISON:  October 02, 2019 FINDINGS: Again noted is an azygos lobe, a normal variant. There is likely a calcified granuloma in the right lung apex. There is no pneumothorax. No focal infiltrate. No significant pleural effusion. There is no acute osseous abnormality. The heart size is normal. IMPRESSION: No active cardiopulmonary disease. Electronically Signed   By: Constance Holster M.D.   On: 10/15/2019 15:00     Scheduled Meds: . albuterol  2.5 mg Nebulization TID  . enoxaparin (LOVENOX) injection  40 mg Subcutaneous Q24H  . irbesartan  300 mg Oral Daily   . methylPREDNISolone (SOLU-MEDROL) injection  60 mg Intravenous Q12H  . mometasone-formoterol  2 puff Inhalation BID  . nicotine  14 mg Transdermal Daily  . pantoprazole  40 mg Oral Daily   Continuous Infusions:   LOS: 1 day    Time spent: 35 minutes spent in the coordination of care today.    Jonnie Finner, DO [facility] Pager [phone]  If 7PM-7AM, please contact night-coverage [email or web address] Password [email or web address] 10/16/2019, 1:54 PM

## 2019-10-16 NOTE — Progress Notes (Signed)
  Speech Language Pathology  Patient Details Name: Cody Tapia MRN: [id number] DOB: [birth year] Today's Date: 10/16/2019 Time:  -     Therapist spoke with Dr. Marylyn Ishihara re: pt with COPD and recurrent admissions for respiratory distress. Pt did not follow up with GI at [facility] as recommended (says he was not aware) for further work up. Recent chest CT showing signs of bronchial inflammation and air trapping with areas of pneumonitis at the lung bases raising the question of aspiration. Correlation with clinical signs of reflux is suggested. Per MD pt taking reflux meds. Pt likely aspirating reflux post prandial and would need to have GI work up however pt's with COPD are at higher risk for silent aspiration. Will perform MBS to assess oropharyngeal integrity of swallow- planning for 11/24 preferably in morning.                           [name] 10/16/2019, 2:03 PM   [name].Ed Risk analyst [phone] Office [phone]

## 2019-10-16 NOTE — Progress Notes (Signed)
Patient wanted to take off his oxygen and see what his oxygen saturation is, and now his oxygen saturation without oxygen 91-92% on room air.   Farley Ly RN

## 2019-10-16 NOTE — ED Provider Notes (Signed)
[facility] Provider Note   CSN: [id number] Arrival date & time: 10/15/19  1424     History   Chief Complaint Chief Complaint  Patient presents with  . Shortness of Breath    HPI Cody Tapia is a 53 y.o. male.     Patient with COPD.  Patient complains of shortness of breath and he has been treated twice with steroids and antibiotics and seems to be getting worse.  The history is provided by the patient. No language interpreter was used.  Shortness of Breath Severity:  Moderate Onset quality:  Gradual Timing:  Constant Progression:  Worsening Chronicity:  Recurrent Context: activity   Relieved by:  Nothing Associated symptoms: no abdominal pain, no chest pain, no cough, no headaches and no rash     Past Medical History:  Diagnosis Date  . 23-polyvalent pneumococcal polysaccharide vaccine declined 7/14  . Abnormal lung field   . Anxiety    prior therapy, in remission  . Asthma   . Atypical chest pain   . ED (erectile dysfunction)   . GERD (gastroesophageal reflux disease)   . GERD (gastroesophageal reflux disease)   . Hypercholesteremia   . Hyperglycemia   . Hypertension   . Obesity   . Thoracic ascending aortic aneurysm ([place])   . Tobacco use   . Wears glasses     Patient Active Problem List   Diagnosis Date Noted  . Acute respiratory failure with hypoxia ([place]) 10/15/2019  . Hypertension 10/15/2019  . Anxiety 10/15/2019  . Thoracic aortic aneurysm without rupture ([place]) 09/04/2019  . Cough 09/04/2019  . Chronic chest pain 10/18/2018  . Wheezing 10/18/2018  . Abnormal lung field 10/18/2018  . Need for influenza vaccination 10/18/2018  . Mood change 10/18/2018  . Vaccine counseling 03/07/2018  . Extrinsic asthma without complication [id number]  . Chest tightness 02/22/2018  . Abnormal PFT 02/22/2018  . Rectal fissure 09/22/2017  . Rectal pain 09/22/2017  . Encounter for screening for malignant neoplasm of respiratory organs  01/14/2017  . Polyp of colon 01/14/2017  . Need for pneumococcal vaccination 01/14/2017  . Throat discomfort 09/21/2016  . Elevated blood-pressure reading without diagnosis of hypertension 09/21/2016  . Upper airway cough syndrome 04/30/2016  . Overweight 11/07/2015  . Routine general medical examination at a health care facility 11/07/2015  . Motion sickness 11/07/2015  . Gastroesophageal reflux disease 11/07/2015  . Erectile dysfunction 11/07/2015  . History of prostatitis 11/07/2015  . Cigarette smoker 10/22/2014  . Class 1 obesity with serious comorbidity in adult 10/22/2014  . GERD (gastroesophageal reflux disease) 05/03/2012  . Tobacco abuse 05/03/2012  . Astigmatism 10/22/2011  . Exotropia 10/22/2011  . Ptosis of eyelid 10/22/2011    Past Surgical History:  Procedure Laterality Date  . COLONOSCOPY  2009   [facility] for abdominal pain  . ESOPHAGOGASTRODUODENOSCOPY  2009   [facility], abdominal pain  . EYE SURGERY     strabismus surgery, right  . WISDOM TOOTH EXTRACTION          Home Medications    Prior to Admission medications   Medication Sig Start Date End Date Taking? Authorizing Provider  albuterol (VENTOLIN HFA) 108 (90 Base) MCG/ACT inhaler Inhale 2 puffs into the lungs every 6 (six) hours as needed for wheezing. 09/04/19  Yes Tysinger, Camelia Eng, PA-C  budesonide-formoterol (SYMBICORT) 160-4.5 MCG/ACT inhaler Inhale 2 puffs into the lungs 2 (two) times daily. 09/29/19  Yes Tysinger, Camelia Eng, PA-C  irbesartan (AVAPRO) 300 MG  tablet Take 1 tablet (300 mg total) by mouth daily. 01/24/19  Yes Adrian Prows, MD  Multiple Vitamin (MULTIVITAMIN) tablet Take 1 tablet by mouth daily.   Yes [provider]  pantoprazole (PROTONIX) 40 MG tablet Take 1 tablet (40 mg total) by mouth daily. 09/04/19  Yes Tysinger, Camelia Eng, PA-C  tadalafil (CIALIS) 20 MG tablet Take 1 tablet (20 mg total) by mouth daily. as directed 09/04/19  Yes Tysinger, Camelia Eng, PA-C   valACYclovir (VALTREX) 1000 MG tablet Take 1,000 mg by mouth daily. 07/24/19  Yes [provider]  albuterol (PROVENTIL) (2.5 MG/3ML) 0.083% nebulizer solution Take 3 mLs (2.5 mg total) by nebulization every 6 (six) hours as needed for wheezing or shortness of breath. Patient not taking: Reported on 10/15/2019 10/11/19   Tysinger, Camelia Eng, PA-C    Family History Family History  Problem Relation Age of Onset  . Heart disease Father 67       stent, CAD  . COPD Mother   . Sudden death Neg Hx   . Heart attack Neg Hx   . Hyperlipidemia Neg Hx   . Diabetes Neg Hx   . Hypertension Neg Hx   . Cancer Neg Hx   . Stroke Neg Hx     Social History Social History   Tobacco Use  . Smoking status: Current Some Day Smoker    Packs/day: 0.25    Years: 30.00    Pack years: 7.50    Types: Cigarettes  . Smokeless tobacco: Never Used  Substance Use Topics  . Alcohol use: Yes    Alcohol/week: 3.0 standard drinks    Types: 3 Glasses of wine per week  . Drug use: No     Allergies   Chantix [varenicline tartrate] and Wellbutrin [bupropion]   Review of Systems Review of Systems  Constitutional: Negative for appetite change and fatigue.  HENT: Negative for congestion, ear discharge and sinus pressure.   Eyes: Negative for discharge.  Respiratory: Positive for shortness of breath. Negative for cough.   Cardiovascular: Negative for chest pain.  Gastrointestinal: Negative for abdominal pain and diarrhea.  Genitourinary: Negative for frequency and hematuria.  Musculoskeletal: Negative for back pain.  Skin: Negative for rash.  Neurological: Negative for seizures and headaches.  Psychiatric/Behavioral: Negative for hallucinations.     Physical Exam Updated Vital Signs BP (!) 144/77 (BP Location: Right Arm)   Pulse 95   Temp (!) 97.5 F (36.4 C) (Oral)   Resp 18   Ht 5\' 11"  (1.803 m)   Wt 102.3 kg   SpO2 92%   BMI 31.46 kg/m   Physical Exam Vitals signs and nursing note  reviewed.  Constitutional:      Appearance: He is well-developed.  HENT:     Head: Normocephalic.     Right Ear: External ear normal.  Eyes:     General: No scleral icterus.    Conjunctiva/sclera: Conjunctivae normal.  Neck:     Musculoskeletal: Neck supple.     Thyroid: No thyromegaly.  Cardiovascular:     Rate and Rhythm: Normal rate and regular rhythm.     Heart sounds: No murmur. No friction rub. No gallop.   Pulmonary:     Breath sounds: No stridor. Wheezing present. No rales.  Chest:     Chest wall: No tenderness.  Abdominal:     General: There is no distension.     Tenderness: There is no abdominal tenderness. There is no rebound.  Musculoskeletal: Normal range of motion.  Lymphadenopathy:     Cervical: No cervical adenopathy.  Skin:    Findings: No erythema or rash.  Neurological:     Mental Status: He is oriented to person, place, and time.     Motor: No abnormal muscle tone.     Coordination: Coordination normal.  Psychiatric:        Behavior: Behavior normal.      ED Treatments / Results  Labs (all labs ordered are listed, but only abnormal results are displayed) Labs Reviewed  CBC WITH DIFFERENTIAL/PLATELET - Abnormal; Notable for the following components:      Result Value   WBC 16.1 (*)    Neutro Abs 12.5 (*)    Monocytes Absolute 1.3 (*)    Eosinophils Absolute 0.8 (*)    Abs Immature Granulocytes 0.09 (*)    All other components within normal limits  COMPREHENSIVE METABOLIC PANEL - Abnormal; Notable for the following components:   Glucose, Bld 104 (*)    All other components within normal limits  CBC - Abnormal; Notable for the following components:   WBC 11.2 (*)    All other components within normal limits  MAGNESIUM - Abnormal; Notable for the following components:   Magnesium 2.5 (*)    All other components within normal limits  BASIC METABOLIC PANEL - Abnormal; Notable for the following components:   Glucose, Bld 148 (*)    All other  components within normal limits  PHOSPHORUS  HIV ANTIBODY (ROUTINE TESTING W REFLEX)  POC SARS CORONAVIRUS 2 AG -  ED    EKG EKG Interpretation  Date/Time:  Sunday October 15 2019 14:29:54 EST Ventricular Rate:  87 PR Interval:  154 QRS Duration: 88 QT Interval:  364 QTC Calculation: 438 R Axis:   79 Text Interpretation: Normal sinus rhythm Normal ECG Confirmed by Ripley Fraise [phone]) on 10/16/2019 7:56:41 AM   Radiology Dg Chest 2 View  Result Date: 10/15/2019 CLINICAL DATA:  Shortness of breath. EXAM: CHEST - 2 VIEW COMPARISON:  October 02, 2019 FINDINGS: Again noted is an azygos lobe, a normal variant. There is likely a calcified granuloma in the right lung apex. There is no pneumothorax. No focal infiltrate. No significant pleural effusion. There is no acute osseous abnormality. The heart size is normal. IMPRESSION: No active cardiopulmonary disease. Electronically Signed   By: Constance Holster M.D.   On: 10/15/2019 15:00    Procedures Procedures (including critical care time)  Medications Ordered in ED Medications  enoxaparin (LOVENOX) injection 40 mg (40 mg Subcutaneous Given 10/15/19 2236)  acetaminophen (TYLENOL) tablet 650 mg (has no administration in time range)    Or  acetaminophen (TYLENOL) suppository 650 mg (has no administration in time range)  ondansetron (ZOFRAN) tablet 4 mg (has no administration in time range)    Or  ondansetron (ZOFRAN) injection 4 mg (has no administration in time range)  albuterol (PROVENTIL) (2.5 MG/3ML) 0.083% nebulizer solution 2.5 mg (2.5 mg Nebulization Given 10/16/19 0721)  nicotine (NICODERM CQ - dosed in mg/24 hours) patch 14 mg (0 mg Transdermal Hold 10/15/19 2127)  methylPREDNISolone sodium succinate (SOLU-MEDROL) 125 mg/2 mL injection 60 mg (60 mg Intravenous Given 10/16/19 0514)  irbesartan (AVAPRO) tablet 300 mg (has no administration in time range)  pantoprazole (PROTONIX) EC tablet 40 mg (has no administration in  time range)  mometasone-formoterol (DULERA) 200-5 MCG/ACT inhaler 2 puff (2 puffs Inhalation Not Given 10/16/19 0721)  albuterol (VENTOLIN HFA) 108 (90 Base) MCG/ACT inhaler 6 puff (6 puffs Inhalation Given 10/15/19 1653)  ipratropium (ATROVENT HFA) inhaler 2 puff (2 puffs Inhalation Given 10/15/19 1658)  methylPREDNISolone sodium succinate (SOLU-MEDROL) 125 mg/2 mL injection 125 mg (125 mg Intravenous Given 10/15/19 1656)  magnesium sulfate IVPB 2 g 50 mL (0 g Intravenous Stopped 10/15/19 1759)  AeroChamber Plus Flo-Vu Large MISC (1 each  Given 10/15/19 1701)  hydrOXYzine (ATARAX/VISTARIL) tablet 10 mg (10 mg Oral Given 10/15/19 2235)  zolpidem (AMBIEN) tablet 5 mg (5 mg Oral Given 10/16/19 0019)     Initial Impression / Assessment and Plan / ED Course  I have reviewed the triage vital signs and the nursing notes.  Pertinent labs & imaging results that were available during my care of the patient were reviewed by me and considered in my medical decision making (see chart for details).        CRITICAL CARE Performed by: Milton Ferguson Total critical care time:35 minutes Critical care time was exclusive of separately billable procedures and treating other patients. Critical care was necessary to treat or prevent imminent or life-threatening deterioration. Critical care was time spent personally by me on the following activities: development of treatment plan with patient and/or surrogate as well as nursing, discussions with consultants, evaluation of patient's response to treatment, examination of patient, obtaining history from patient or surrogate, ordering and performing treatments and interventions, ordering and review of laboratory studies, ordering and review of radiographic studies, pulse oximetry and re-evaluation of patient's condition. Patient with exacerbation of COPD.  He is dependent on oxygen here in the emergency department.  He will be admitted to medicine Final Clinical  Impressions(s) / ED Diagnoses   Final diagnoses:  COPD exacerbation [facility])    ED Discharge Orders    None       Milton Ferguson, MD 10/16/19 [phone]

## 2019-10-17 ENCOUNTER — Inpatient Hospital Stay (HOSPITAL_COMMUNITY): Payer: BC Managed Care – PPO

## 2019-10-17 ENCOUNTER — Other Ambulatory Visit: Payer: Self-pay | Admitting: Medical

## 2019-10-17 ENCOUNTER — Telehealth: Payer: Self-pay | Admitting: Medical

## 2019-10-17 DIAGNOSIS — R0602 Shortness of breath: Secondary | ICD-10-CM

## 2019-10-17 LAB — ECHOCARDIOGRAM COMPLETE
Height: 71 in
Weight: 3608.49 oz

## 2019-10-17 LAB — RESPIRATORY PANEL BY PCR

## 2019-10-17 LAB — COMPREHENSIVE METABOLIC PANEL
ALT: 23 U/L (ref 0–44)
AST: 16 U/L (ref 15–41)
Albumin: 3.2 g/dL — ABNORMAL LOW (ref 3.5–5.0)
Alkaline Phosphatase: 86 U/L (ref 38–126)
Anion gap: 10 (ref 5–15)
BUN: 26 mg/dL — ABNORMAL HIGH (ref 6–20)
CO2: 23 mmol/L (ref 22–32)
Calcium: 9.5 mg/dL (ref 8.9–10.3)
Chloride: 105 mmol/L (ref 98–111)
Creatinine, Ser: 1.02 mg/dL (ref 0.61–1.24)
GFR calc Af Amer: >60 mL/min (ref >60)
GFR calc non Af Amer: >60 mL/min (ref >60)
Glucose, Bld: 135 mg/dL — ABNORMAL HIGH (ref 70–99)
Potassium: 4.8 mmol/L (ref 3.5–5.1)
Sodium: 138 mmol/L (ref 135–145)
Total Bilirubin: 0.7 mg/dL (ref 0.3–1.2)
Total Protein: 6.7 g/dL (ref 6.5–8.1)

## 2019-10-17 LAB — CBC WITH DIFFERENTIAL/PLATELET
Abs Immature Granulocytes: 0.17 10*3/uL — ABNORMAL HIGH (ref 0.00–0.07)
Basophils Absolute: 0 10*3/uL (ref 0.0–0.1)
Basophils Relative: 0 %
Eosinophils Absolute: 0 10*3/uL (ref 0.0–0.5)
Eosinophils Relative: 0 %
HCT: 43 % (ref 39.0–52.0)
Hemoglobin: 14.4 g/dL (ref 13.0–17.0)
Immature Granulocytes: 1 %
Lymphocytes Relative: 6 %
Lymphs Abs: 1.1 10*3/uL (ref 0.7–4.0)
MCH: 31.5 pg (ref 26.0–34.0)
MCHC: 33.5 g/dL (ref 30.0–36.0)
MCV: 94.1 fL (ref 80.0–100.0)
Monocytes Absolute: 1.4 10*3/uL — ABNORMAL HIGH (ref 0.1–1.0)
Monocytes Relative: 7 %
Neutro Abs: 17.8 10*3/uL — ABNORMAL HIGH (ref 1.7–7.7)
Neutrophils Relative %: 86 %
Platelets: 274 10*3/uL (ref 150–400)
RBC: 4.57 MIL/uL (ref 4.22–5.81)
RDW: 13.2 % (ref 11.5–15.5)
WBC: 20.6 10*3/uL — ABNORMAL HIGH (ref 4.0–10.5)
nRBC: 0 % (ref 0.0–0.2)

## 2019-10-17 LAB — MAGNESIUM: Magnesium: 2.3 mg/dL (ref 1.7–2.4)

## 2019-10-17 MED ORDER — PREDNISONE 10 MG PO TABS: ORAL_TABLET | ORAL | 0 refills | Status: AC

## 2019-10-17 MED ORDER — NICOTINE 10 MG IN INHA: 1.0000 | RESPIRATORY_TRACT | 0 refills | Status: DC | PRN

## 2019-10-17 MED ORDER — LEVOFLOXACIN 500 MG PO TABS: 500.0000 mg | ORAL_TABLET | Freq: Every day | ORAL | 0 refills | Status: AC

## 2019-10-17 MED ORDER — ALBUTEROL SULFATE (2.5 MG/3ML) 0.083% IN NEBU: 2.5000 mg | INHALATION_SOLUTION | Freq: Four times a day (QID) | RESPIRATORY_TRACT | Status: DC | PRN

## 2019-10-17 MED ORDER — NICOTINE 14 MG/24HR TD PT24: 14.0000 mg | MEDICATED_PATCH | Freq: Every day | TRANSDERMAL | 0 refills | Status: AC

## 2019-10-17 MED ORDER — LEVOFLOXACIN 500 MG PO TABS
500.0000 mg | ORAL_TABLET | Freq: Every day | ORAL | Status: DC
  Administered 2019-10-17: 500 mg via ORAL
  Filled 2019-10-17: qty 1

## 2019-10-17 MED ORDER — INFLUENZA VAC SPLIT QUAD 0.5 ML IM SUSY: 0.5000 mL | PREFILLED_SYRINGE | INTRAMUSCULAR | Status: DC

## 2019-10-17 NOTE — Progress Notes (Signed)
SLP Note  Patient Details Name: Cody Tapia MRN: [id number] DOB: Apr 11, 1966   Pt was scheduled for 9:00 am MBS, but was getting echo when transporters arrived to take him to radiology. He has been rescheduled for 1400 this afternoon.  [name] 10/17/2019, 9:43 AM   Park Pope. Tivis Ringer, [place] Office number [phone] Pager [phone]

## 2019-10-17 NOTE — Discharge Summary (Signed)
. Physician Discharge Summary  Cody Tapia [id number] DOB: [birth year] DOA: 10/15/2019  PCP: Carlena Hurl, PA-C  Admit date: 10/15/2019 Discharge date: 10/17/2019  Admitted From: Home Disposition:  Discharged to home.  Recommendations for Outpatient Follow-up:  1. Follow up with PCP in 1 week 2. Please obtain BMP/CBC in one week  Discharge Condition: Stable  CODE STATUS: Discharged to home.   Brief/Interim Summary: Cody Shadow Meyerhofferis a 54 y.o.malewith medical history significant ofhypertension, anxiety, asthma/COPD, obesity, erectile dysfunction, GERD presents to emergency department due to worsening of shortness of breath and wheezing since 6 weeks.  Patient presented to [facility] ED 2 weeks ago and was diagnosed with bronchitis and discharged home on p.o. steroids. CT angiogram was performed which came back negative for pulmonary embolism. He smokes 1 pack of cigarettes-trying to quit, drinks alcohol occasionally, denies illicit drug use. He had pulmonary function test on 01/09/2019 which showed minimal obstructive airway disease. Patient uses albuterol and Symbicort at home. Not on home oxygen.  Discharge Diagnoses:  Principal Problem:   Acute respiratory failure with hypoxia (HCC) Active Problems:   GERD (gastroesophageal reflux disease)   Tobacco abuse   Hypertension   Anxiety  Acute hypoxic respiratory failure     - Chest x-Karl negative, COVID-19 negative     - CTA chest from 11/9: Signs of bronchial inflammation and air trapping with areas of pneumonitis at the lung bases raising the question of aspiration. Correlation with clinical signs of reflux is suggested.     - multiple visits in the last month-6weeks for same symptoms     - eval'd by pulm at beginning of the year for similar symptoms; was referred to [facility] voice [facility] (did not follow up)     - not on O2 at home; currently on 4L [place], wean as able     - continue solumedrol, dulera, PRN  albuterol     - continue IS     - have consulted SLP to eval for chronic silent aspiration; will go for MBS tomorrow     - have ordered hi-res CT chest     - have ordered echo; doesn't give the impression of volume overload at this time, but can not r/o cardiac involvement just yet     - 11/24: says he is walking up and down the hall w/o O2; doesn't feel 100% but does feel much better     - MBS w/o signs of aspiration     - HRCT notes ground glass opacities c/w inflammation vs infection; adding lvq 500mg  x 5 days     - Echo unremarkable     - RVP is negative     - at this point I would call this an PNA of unknown etiology; I don't know that he has true COPD     - we spoke about continued follow up with PCP and pulmonology     - he should see GI about his reflux     - continue lvq and steroid taper; follow up with PCP next week     - spoke about scheduled inhalers and proper inhaler technique; as well as rescue/PRN inhalers; he voices understanding.   Hypertension     - continue irbesartan  Tobacco abuse     - nicotine patch     - he says he's weaning tobacco use  Anxiety     - hydroxyzine qHS PRN  GERD     - continue protonix  Left renal cyst:      -  Incidental finding noted on CT angiogram     - Follow-up outpatient with renal ultrasound  Discharge Instructions   Allergies as of 10/17/2019      Reactions   Chantix [varenicline Tartrate]    Wellbutrin [bupropion]    Sleepwalking, intolerance      Medication List    TAKE these medications   albuterol 108 (90 Base) MCG/ACT inhaler Commonly known as: VENTOLIN HFA Inhale 2 puffs into the lungs every 6 (six) hours as needed for wheezing.   albuterol (2.5 MG/3ML) 0.083% nebulizer solution Commonly known as: PROVENTIL Take 3 mLs (2.5 mg total) by nebulization every 6 (six) hours as needed for wheezing or shortness of breath.   budesonide-formoterol 160-4.5 MCG/ACT inhaler Commonly known as: Symbicort Inhale 2  puffs into the lungs 2 (two) times daily.   irbesartan 300 MG tablet Commonly known as: AVAPRO Take 1 tablet (300 mg total) by mouth daily.   levofloxacin 500 MG tablet Commonly known as: LEVAQUIN Take 1 tablet (500 mg total) by mouth daily for 5 days. Start taking on: October 18, 2019   multivitamin tablet Take 1 tablet by mouth daily.   nicotine 14 mg/24hr patch Commonly known as: NICODERM CQ - dosed in mg/24 hours Place 1 patch (14 mg total) onto the skin daily for 14 days. Start taking on: October 18, 2019   pantoprazole 40 MG tablet Commonly known as: PROTONIX Take 1 tablet (40 mg total) by mouth daily.   predniSONE 10 MG tablet Commonly known as: DELTASONE Take 4 tablets (40 mg total) by mouth daily for 3 days, THEN 2 tablets (20 mg total) daily for 2 days, THEN 1 tablet (10 mg total) daily for 2 days. Start taking on: October 17, 2019   tadalafil 20 MG tablet Commonly known as: CIALIS Take 1 tablet (20 mg total) by mouth daily. as directed   valACYclovir 1000 MG tablet Commonly known as: VALTREX Take 1,000 mg by mouth daily.       Allergies  Allergen Reactions  . Chantix [Varenicline Tartrate]   . Wellbutrin [Bupropion]     Sleepwalking, intolerance    Procedures/Studies: Dg Chest 2 View  Result Date: 10/15/2019 CLINICAL DATA:  Shortness of breath. EXAM: CHEST - 2 VIEW COMPARISON:  October 02, 2019 FINDINGS: Again noted is an azygos lobe, a normal variant. There is likely a calcified granuloma in the right lung apex. There is no pneumothorax. No focal infiltrate. No significant pleural effusion. There is no acute osseous abnormality. The heart size is normal. IMPRESSION: No active cardiopulmonary disease. Electronically Signed   By: Constance Holster M.D.   On: 10/15/2019 15:00   Dg Chest 2 View  Result Date: 10/02/2019 CLINICAL DATA:  Shortness of breath EXAM: CHEST - 2 VIEW COMPARISON:  September 11 2019 FINDINGS: No new consolidation or edema. No  pleural effusion or pneumothorax. Stable cardiomediastinal contours with normal heart size. No acute osseous abnormality. IMPRESSION: No new findings.  No acute process in the chest Electronically Signed   By: Macy Mis M.D.   On: 10/02/2019 10:44   Ct Angio Chest Pe W And/or Wo Contrast  Addendum Date: 10/02/2019   ADDENDUM REPORT: 10/02/2019 13:16 ADDENDUM: These results were called by telephone at the time of interpretation on 10/02/2019 at 1:16 pm to provider [facility] , who verbally acknowledged these results. Electronically Signed   By: Zetta Bills M.D.   On: 10/02/2019 13:16   Result Date: 10/02/2019 CLINICAL DATA:  Shortness of breath, persistent shortness of  breath for 5 weeks without improvement. EXAM: CT ANGIOGRAPHY CHEST WITH CONTRAST TECHNIQUE: Multidetector CT imaging of the chest was performed using the standard protocol during bolus administration of intravenous contrast. Multiplanar CT image reconstructions and MIPs were obtained to evaluate the vascular anatomy. CONTRAST:  113mL OMNIPAQUE IOHEXOL 350 MG/ML SOLN COMPARISON:  Chest x-[place] of the same date, CT chest of 11/18/2018 FINDINGS: Cardiovascular: Aorta is of normal caliber. Heart size is normal without pericardial effusion. No signs of atherosclerotic changes in the thoracic aorta. No evidence of pulmonary embolism. Mediastinum/Nodes: No enlarged mediastinal, hilar, or axillary lymph nodes. Thyroid gland, trachea, and esophagus demonstrate no significant findings. Lungs/Pleura: Mild nodularity along the azygos fissure as on the previous study. (Image 33, series 7) 9 mm area of nodularity is the largest discrete nodule and is unchanged. Areas of ground-glass/mosaic attenuation in the upper lobes bilaterally have developed since the previous chest CT of 11/18/2018. Patchy ground-glass and nodularity in the right lung base is new and associated with mild bronchial wall thickening. Material present in left lower lobe bronchi on  today's exam along with ground-glass and mild septal thickening but no nodularity. Large airways are patent. Upper Abdomen: Low-density lesion in the upper pole the left kidney approximately 3.2 cm, cyst in this location on a previous study. No acute findings in the upper abdomen. Small hiatal hernia. Musculoskeletal: No chest wall abnormality. No acute or significant osseous findings. It has Review of the MIP images confirms the above findings. IMPRESSION: 1. No pulmonary embolism. 2. Signs of bronchial inflammation and air trapping with areas of pneumonitis at the lung bases raising the question of aspiration. Correlation with clinical signs of reflux is suggested. 3. Signs of granulomatous disease with calcified nodule and partially calcified areas of nodularity along the azygos fissure. 4. Small hiatal hernia. 5. Signs of a cystic left renal lesion, incompletely imaged. Suggestion of very subtle internal complexity with simple cyst image in this location in 2009, potentially representing small amount of blood products in this previously described simple cyst. This has enlarged since the previous study suggest follow-up renal sonogram for further assessment. 6. A call is out to the referring provider to discuss above findings. Aortic Atherosclerosis (ICD10-I70.0). Electronically Signed: By: Zetta Bills M.D. On: 10/02/2019 13:04   Ct Chest High Resolution  Result Date: 10/16/2019 CLINICAL DATA:  Shortness of breath EXAM: CT CHEST WITHOUT CONTRAST TECHNIQUE: Multidetector CT imaging of the chest was performed following the standard protocol without intravenous contrast. High resolution imaging of the lungs, as well as inspiratory and expiratory imaging, was performed. COMPARISON:  CT chest angiogram, 10/02/2019, CT chest, 11/18/2018 FINDINGS: Cardiovascular: The tubular ascending thoracic aorta is enlarged, measuring 4.2 x 4.1 cm, not significantly changed compared to prior examinations. Normal heart size.  No pericardial effusion. Mediastinum/Nodes: No enlarged mediastinal, hilar, or axillary lymph nodes. Calcified small right hilar lymph nodes. Thyroid gland, trachea, and esophagus demonstrate no significant findings. Lungs/Pleura: Examination is generally limited by breath motion artifact. Within this limitation, there are scattered, generally peribronchovascular and somewhat geographic ground-glass opacities (series 10, image 96, 61). Mild bronchial wall thickening. No significant air trapping on expiratory phase imaging. Small redemonstrated benign calcified nodules of the right upper lobe (series 10, image 34). No pleural effusion or pneumothorax. Upper Abdomen: No acute abnormality. Musculoskeletal: No chest wall mass or suspicious bone lesions identified. IMPRESSION: 1. Examination is generally limited by breath motion artifact. Within this limitation, there are scattered, generally peribronchovascular and somewhat geographic ground-glass opacities, which are fluctuant  in comparison to prior examination dated 10/02/2019 (series 10, image 96, 61). Mild bronchial wall thickening. No significant air trapping on expiratory phase imaging. These opacities are nonspecific and infectious or inflammatory given fluctuant nature. No evidence of fibrotic interstitial lung disease. 2. Calcific stigma of prior granulomatous disease of the right lung. 3. The tubular ascending thoracic aorta is enlarged, measuring 4.2 x 4.1 cm, not significantly changed compared to prior examinations. Electronically Signed   By: Eddie Candle M.D.   On: 10/16/2019 15:24   Dg Swallowing Func-speech Pathology  Result Date: 10/17/2019 Objective Swallowing Evaluation: Type of Study: MBS-Modified Barium Swallow Study  Patient Details Name: Jawaan Lanfear MRN: [id number] Date of Birth: June 02, 1966 Today's Date: 10/17/2019 Time: SLP Start Time (ACUTE ONLY): 1405 -SLP Stop Time (ACUTE ONLY): 1420 SLP Time Calculation (min) (ACUTE ONLY): 15 min  Past Medical History: Past Medical History: Diagnosis Date . 23-polyvalent pneumococcal polysaccharide vaccine declined 7/14 . Abnormal lung field  . Anxiety   prior therapy, in remission . Asthma  . Atypical chest pain  . ED (erectile dysfunction)  . GERD (gastroesophageal reflux disease)  . GERD (gastroesophageal reflux disease)  . Hypercholesteremia  . Hyperglycemia  . Hypertension  . Obesity  . Thoracic ascending aortic aneurysm ([place])  . Tobacco use  . Wears glasses  Past Surgical History: Past Surgical History: Procedure Laterality Date . COLONOSCOPY  2009  [facility] for abdominal pain . ESOPHAGOGASTRODUODENOSCOPY  2009  [facility], abdominal pain . EYE SURGERY    strabismus surgery, right . WISDOM TOOTH EXTRACTION   HPI: Pt is a 53 yo male admitted with worsening SOB. He was in the ED two weeks prior and was dx with bronchitis. CT Chest on 11/23 showed signs of bronchial inflammation and air trapping with areas of pneumonitis at the lung bases raising the question of aspiration. PMH: HTN, GERD, asthma, anxiety, obesity  Subjective: pt says he will sometimes wake up in the middle of the night choking, throat burning Assessment / Plan / Recommendation CHL IP CLINICAL IMPRESSIONS 10/17/2019 Clinical Impression Pt's oropharyngeal swallow is WFL across challenging. Neither aspiration nor backflow into the pharynx could be elicited. Pt is appropriate to continue regular solids and thin liquids from an oropharyngeal standpoint. Of note, pt has symptoms that sound like they could be more related to LPR. He reports a longstanding h/o GERD, but that he has started to exhibit difficulties overnight, described as waking up from sleep coughing, "choking," and with his throat burning. The next day his voice will be hoarse. Recommend that MD consider additional GI and/or ENT follow up as w/u continues to try to determine the cause of pt's respiratory symptoms. Acute SLP needs not identified at this time. Pt was  educated about esophageal precautions and handout was provided.  SLP Visit Diagnosis Dysphagia, unspecified (R13.10) Attention and concentration deficit following -- Frontal lobe and executive function deficit following -- Impact on safety and function Mild aspiration risk   CHL IP TREATMENT RECOMMENDATION 10/17/2019 Treatment Recommendations No treatment recommended at this time   No flowsheet data found. CHL IP DIET RECOMMENDATION 10/17/2019 SLP Diet Recommendations Regular solids;Thin liquid Liquid Administration via Cup;Straw Medication Administration Whole meds with liquid Compensations Slow rate;Small sips/bites;Follow solids with liquid Postural Changes Remain semi-upright after after feeds/meals (Comment);Seated upright at 90 degrees   CHL IP OTHER RECOMMENDATIONS 10/17/2019 Recommended Consults Consider ENT evaluation;Consider GI evaluation Oral Care Recommendations Oral care BID Other Recommendations --   CHL IP FOLLOW UP RECOMMENDATIONS 10/17/2019 Follow up  Recommendations None   No flowsheet data found.     CHL IP ORAL PHASE 10/17/2019 Oral Phase WFL Oral - Pudding Teaspoon -- Oral - Pudding Cup -- Oral - Honey Teaspoon -- Oral - Honey Cup -- Oral - Nectar Teaspoon -- Oral - Nectar Cup -- Oral - Nectar Straw -- Oral - Thin Teaspoon -- Oral - Thin Cup -- Oral - Thin Straw -- Oral - Puree -- Oral - Mech Soft -- Oral - Regular -- Oral - Multi-Consistency -- Oral - Pill -- Oral Phase - Comment --  CHL IP PHARYNGEAL PHASE 10/17/2019 Pharyngeal Phase WFL Pharyngeal- Pudding Teaspoon -- Pharyngeal -- Pharyngeal- Pudding Cup -- Pharyngeal -- Pharyngeal- Honey Teaspoon -- Pharyngeal -- Pharyngeal- Honey Cup -- Pharyngeal -- Pharyngeal- Nectar Teaspoon -- Pharyngeal -- Pharyngeal- Nectar Cup -- Pharyngeal -- Pharyngeal- Nectar Straw -- Pharyngeal -- Pharyngeal- Thin Teaspoon -- Pharyngeal -- Pharyngeal- Thin Cup -- Pharyngeal -- Pharyngeal- Thin Straw -- Pharyngeal -- Pharyngeal- Puree -- Pharyngeal -- Pharyngeal-  Mechanical Soft -- Pharyngeal -- Pharyngeal- Regular -- Pharyngeal -- Pharyngeal- Multi-consistency -- Pharyngeal -- Pharyngeal- Pill -- Pharyngeal -- Pharyngeal Comment --  CHL IP CERVICAL ESOPHAGEAL PHASE 10/17/2019 Cervical Esophageal Phase WFL Pudding Teaspoon -- Pudding Cup -- Honey Teaspoon -- Honey Cup -- Nectar Teaspoon -- Nectar Cup -- Nectar Straw -- Thin Teaspoon -- Thin Cup -- Thin Straw -- Puree -- Mechanical Soft -- Regular -- Multi-consistency -- Pill -- Cervical Esophageal Comment -- Venita Sheffield Nix 10/17/2019, 3:11 PM  Pollyann Glen, M.A. CCC-SLP Acute Rehabilitation Services Pager [phone] Office [phone]                Subjective: "I think we can try this."  Discharge Exam: Vitals:   10/17/19 1017 10/17/19 1100  BP:    Pulse:    Resp:    Temp:    SpO2: 91% 94%   Vitals:   10/17/19 1000 10/17/19 1016 10/17/19 1017 10/17/19 1100  BP:      Pulse:      Resp:      Temp:      TempSrc:      SpO2: 94% 90% 91% 94%  Weight:      Height:       General: 53 y.o. male resting in bed in NAD Cardiovascular: RRR, +S1, S2, no m/g/r, equal pulses throughout Respiratory: CTABL, no w/r/r, normal WOB GI: BS+, NDNT, no masses noted, no organomegaly noted MSK: No e/c/c Skin: No rashes, bruises, ulcerations noted Neuro: A&O x 3, no focal deficits Psyc: Appropriate interaction and affect, calm/cooperative    The results of significant diagnostics from this hospitalization (including imaging, microbiology, ancillary and laboratory) are listed below for reference.     Microbiology: Recent Results (from the past 240 hour(s))  Respiratory Panel by PCR     Status: None   Collection Time: 10/17/19 10:46 AM   Specimen: Nasopharyngeal Swab; Respiratory  Result Value Ref Range Status   Adenovirus NOT DETECTED NOT DETECTED Final   Coronavirus 229E NOT DETECTED NOT DETECTED Final    Comment: (NOTE) The Coronavirus on the Respiratory Panel, DOES NOT test for the novel  Coronavirus  (2019 nCoV)    Coronavirus HKU1 NOT DETECTED NOT DETECTED Final   Coronavirus NL63 NOT DETECTED NOT DETECTED Final   Coronavirus OC43 NOT DETECTED NOT DETECTED Final   Metapneumovirus NOT DETECTED NOT DETECTED Final   Rhinovirus / Enterovirus NOT DETECTED NOT DETECTED Final   Influenza A NOT DETECTED NOT DETECTED Final   Influenza  B NOT DETECTED NOT DETECTED Final   Parainfluenza Virus 1 NOT DETECTED NOT DETECTED Final   Parainfluenza Virus 2 NOT DETECTED NOT DETECTED Final   Parainfluenza Virus 3 NOT DETECTED NOT DETECTED Final   Parainfluenza Virus 4 NOT DETECTED NOT DETECTED Final   Respiratory Syncytial Virus NOT DETECTED NOT DETECTED Final   Bordetella pertussis NOT DETECTED NOT DETECTED Final   Chlamydophila pneumoniae NOT DETECTED NOT DETECTED Final   Mycoplasma pneumoniae NOT DETECTED NOT DETECTED Final    Comment: Performed at [facility], [place] [address] [place], [place] [address]     Labs: BNP (last 3 results) No results for input(s): BNP in the last 8760 hours. Basic Metabolic Panel: Recent Labs  Lab 10/15/19 1650 10/16/19 0714 10/17/19 0311  NA 136 138 138  K 4.0 4.8 4.8  CL 102 102 105  CO2 24 27 23   GLUCOSE 104* 148* 135*  BUN 15 17 26*  CREATININE 1.17 1.06 1.02  CALCIUM 9.5 9.4 9.5  MG  --  2.5* 2.3  PHOS  --  3.8  --    Liver Function Tests: Recent Labs  Lab 10/15/19 1650 10/17/19 0311  AST 25 16  ALT 32 23  ALKPHOS 118 86  BILITOT 0.7 0.7  PROT 7.6 6.7  ALBUMIN 4.1 3.2*   No results for input(s): LIPASE, AMYLASE in the last 168 hours. No results for input(s): AMMONIA in the last 168 hours. CBC: Recent Labs  Lab 10/15/19 1650 10/16/19 0714 10/17/19 0311  WBC 16.1* 11.2* 20.6*  NEUTROABS 12.5*  --  17.8*  HGB 16.3 15.6 14.4  HCT 48.2 46.2 43.0  MCV 92.3 92.2 94.1  PLT 290 270 274   Cardiac Enzymes: No results for input(s): CKTOTAL, CKMB, CKMBINDEX, TROPONINI in the last 168 hours. BNP: Invalid input(s): POCBNP CBG: No  results for input(s): GLUCAP in the last 168 hours. D-Dimer No results for input(s): DDIMER in the last 72 hours. Hgb A1c No results for input(s): HGBA1C in the last 72 hours. Lipid Profile No results for input(s): CHOL, HDL, LDLCALC, TRIG, CHOLHDL, LDLDIRECT in the last 72 hours. Thyroid function studies No results for input(s): TSH, T4TOTAL, T3FREE, THYROIDAB in the last 72 hours.  Invalid input(s): FREET3 Anemia work up No results for input(s): VITAMINB12, FOLATE, FERRITIN, TIBC, IRON, RETICCTPCT in the last 72 hours. Urinalysis    Component Value Date/Time   LABSPEC 1.015 03/07/2018 1412   BILIRUBINUR negative 03/07/2018 1412   BILIRUBINUR neg 01/14/2017 0835   KETONESUR negative 03/07/2018 1412   PROTEINUR negative 03/07/2018 1412   PROTEINUR neg 01/14/2017 0835   UROBILINOGEN negative 01/14/2017 0835   NITRITE Negative 03/07/2018 1412   NITRITE neg 01/14/2017 0835   LEUKOCYTESUR Negative 03/07/2018 1412   Sepsis Labs Invalid input(s): PROCALCITONIN,  WBC,  LACTICIDVEN Microbiology Recent Results (from the past 240 hour(s))  Respiratory Panel by PCR     Status: None   Collection Time: 10/17/19 10:46 AM   Specimen: Nasopharyngeal Swab; Respiratory  Result Value Ref Range Status   Adenovirus NOT DETECTED NOT DETECTED Final   Coronavirus 229E NOT DETECTED NOT DETECTED Final    Comment: (NOTE) The Coronavirus on the Respiratory Panel, DOES NOT test for the novel  Coronavirus (2019 nCoV)    Coronavirus HKU1 NOT DETECTED NOT DETECTED Final   Coronavirus NL63 NOT DETECTED NOT DETECTED Final   Coronavirus OC43 NOT DETECTED NOT DETECTED Final   Metapneumovirus NOT DETECTED NOT DETECTED Final   Rhinovirus / Enterovirus NOT DETECTED NOT  DETECTED Final   Influenza A NOT DETECTED NOT DETECTED Final   Influenza B NOT DETECTED NOT DETECTED Final   Parainfluenza Virus 1 NOT DETECTED NOT DETECTED Final   Parainfluenza Virus 2 NOT DETECTED NOT DETECTED Final   Parainfluenza Virus  3 NOT DETECTED NOT DETECTED Final   Parainfluenza Virus 4 NOT DETECTED NOT DETECTED Final   Respiratory Syncytial Virus NOT DETECTED NOT DETECTED Final   Bordetella pertussis NOT DETECTED NOT DETECTED Final   Chlamydophila pneumoniae NOT DETECTED NOT DETECTED Final   Mycoplasma pneumoniae NOT DETECTED NOT DETECTED Final    Comment: Performed at [facility], [place] [address], [place], [place] [address]     Time coordinating discharge: 35 minutes  SIGNED:   Jonnie Finner, DO  [facility] 10/17/2019, 3:45 PM Pager   If 7PM-7AM, please contact night-coverage [email or web address] Password [email or web address]

## 2019-10-17 NOTE — Progress Notes (Signed)
Discharge instructions reviewed with PT. Copy of instructions given to pt and pt informed of scripts being sent to his pharmacy electronically by MD and to pick them up.  Pt states his ride will be here around 6:15, pt instructed to call the front desk when his ride is here and staff will take him out.

## 2019-10-17 NOTE — Progress Notes (Signed)
Pt's ride is here. Pt d/c'd via wheelchair with belongings. Escorted by unit NT.

## 2019-10-17 NOTE — Telephone Encounter (Signed)
Pt called and states he would like a rx for Nicotrol Inhaler. This is only available thru RX. Pt will be discharged from hospital today and will make appt then for hospital follow up. He would like to be able to pick this rx up today when released. PT uses [facility] [address]. He can be reached at [phone].

## 2019-10-17 NOTE — Progress Notes (Signed)
Modified Barium Swallow Progress Note  Patient Details  Name: Cody Tapia MRN: [id number] Date of Birth: [birth year]  Today's Date: 10/17/2019  Modified Barium Swallow completed.  Full report located under Chart Review in the Imaging Section.  Brief recommendations include the following:  Clinical Impression  Pt's oropharyngeal swallow is WFL across challenging. Neither aspiration nor backflow into the pharynx could be elicited. Pt is appropriate to continue regular solids and thin liquids from an oropharyngeal standpoint. Of note, pt has symptoms that sound like they could be more related to LPR. He reports a longstanding h/o GERD, but that he has started to exhibit difficulties overnight, described as waking up from sleep coughing, "choking," and with his throat burning. The next day his voice will be hoarse. Recommend that MD consider additional GI and/or ENT follow up as w/u continues to try to determine the cause of pt's respiratory symptoms. Acute SLP needs not identified at this time. Pt was educated about esophageal precautions and handout was provided.    Swallow Evaluation Recommendations   Recommended Consults: Consider ENT evaluation;Consider GI evaluation   SLP Diet Recommendations: Regular solids;Thin liquid   Liquid Administration via: Cup;Straw   Medication Administration: Whole meds with liquid   Supervision: Patient able to self feed   Compensations: Slow rate;Small sips/bites;Follow solids with liquid   Postural Changes: Remain semi-upright after after feeds/meals (Comment);Seated upright at 90 degrees   Oral Care Recommendations: Oral care BID        Venita Sheffield Saron Vanorman 10/17/2019,3:11 PM   Pollyann Glen, M.A. [facility] education officer [phone] Office [phone]

## 2019-10-17 NOTE — Telephone Encounter (Signed)
Med sent.

## 2019-10-17 NOTE — Progress Notes (Signed)
  Echocardiogram 2D Echocardiogram has been performed.  Blima Jaimes A Shantice Menger 10/17/2019, 9:00 AM

## 2019-10-18 ENCOUNTER — Ambulatory Visit (INDEPENDENT_AMBULATORY_CARE_PROVIDER_SITE_OTHER): Payer: BC Managed Care – PPO | Admitting: Internal Medicine

## 2019-10-18 ENCOUNTER — Other Ambulatory Visit: Payer: Self-pay

## 2019-10-18 ENCOUNTER — Encounter: Payer: Self-pay | Admitting: Internal Medicine

## 2019-10-18 DIAGNOSIS — R058 Other specified cough: Secondary | ICD-10-CM

## 2019-10-18 DIAGNOSIS — F17210 Nicotine dependence, cigarettes, uncomplicated: Secondary | ICD-10-CM | POA: Diagnosis not present

## 2019-10-18 DIAGNOSIS — R05 Cough: Secondary | ICD-10-CM

## 2019-10-18 NOTE — Assessment & Plan Note (Signed)
Onset ? 2017 assoc with chest tightness Spirometry 04/22/16 no sign airflow obst, very minimal  bowing in f/v loop  - rec max gerd rx 04/30/2016  ? Response - Spirometry  02/22/18   FEV1 3.0 (73%)  Ratio 79 s curvature  > started breo which helped chest tightness did not worsen cough - 01/09/2019 ov: cough improved on gerd rx  - PFT's  01/09/2019  FEV1 2.93  (72% ) ratio 0.75  p no % improvement from saba p nothing  prior to study with DLCO  90 % corrects to 107  % for alv volume  And  Very minimal  curvature   Flare with ? Viral uri/ pna non-covid though could have had false negative  Of the three most common causes of  Sub-acute / recurrent or chronic cough, only one (GERD)  can actually contribute to/ trigger  the other two (asthma and post nasal drip syndrome)  and perpetuate the cylce of cough.  While not intuitively obvious, many patients with chronic low grade reflux do not cough until there is a primary insult that disturbs the protective epithelial barrier and exposes sensitive nerve endings.   This is typically viral but can due to PNDS and  either may apply here.   The point is that once this occurs, it is difficult to eliminate the cycle  using anything but a maximally effective acid suppression regimen at least in the short run, accompanied by an appropriate diet to address non acid GERD and control / eliminate the cough itself sith mucinex dm 1200 mg bid and finish pred/levaquin rx then return in 2 weeks with all meds in hand using a trust but verify approach to confirm accurate Medication  Reconciliation The principal here is that until we are certain that the  patients are doing what we've asked, it makes no sense to ask them to do more.    Each maintenance medication was reviewed in detail including most importantly the difference between maintenance and as needed and under what circumstances the prns are to be used.  Please see AVS for specific  Instructions which are unique to this visit  and I personally typed out  which were reviewed in detail over the phone with the patient and a copy provided via MyChart

## 2019-10-18 NOTE — Assessment & Plan Note (Signed)
Spirometry 04/22/16 s sign airflow obst confirmed with completely nl pfts 01/09/2019   Congratulated on stopping smoking

## 2019-10-18 NOTE — Patient Instructions (Addendum)
Protonix 40 mg (or prilosec)  Take 30- 60 min before your first and last meals of the day   GERD (REFLUX)  is an extremely common cause of respiratory symptoms just like yours , many times with no obvious heartburn at all.    It can be treated with medication, but also with lifestyle changes including elevation of the head of your bed (ideally with 6 -8inch blocks under the headboard of your bed),  Smoking cessation, avoidance of late meals, excessive alcohol, and avoid fatty foods, chocolate, peppermint, colas, red wine, and acidic juices such as orange juice.  NO MINT OR MENTHOL PRODUCTS SO NO COUGH DROPS  USE SUGARLESS CANDY INSTEAD (Jolley ranchers or Stover's or Life Savers) or even ice chips will also do - the key is to swallow to prevent all throat clearing. NO OIL BASED VITAMINS - use powdered substitutes.  Avoid fish oil when coughing.  Best cough medication > mucinex dm 1200 mg up to twice daily as needed   Finish antibotics and prednisone   Just use symbicort 160 up to 2 every 12 hours if short of breath  Please schedule a follow up office visit in 2 weeks, sooner if needed  with all medications /inhalers/ solutions in hand so we can verify exactly what you are taking. This includes all medications from all doctors and over the counters

## 2019-10-18 NOTE — Progress Notes (Signed)
Subjective:     Patient ID: Cody Tapia, male   DOB: Jun 04, 1966       MRN: [id number]    Brief patient profile:  52  yowm active smoker with variable chest tightness x at least since summer 2016 but more consistent x mid May 2017 made worse by smoking and eval by [facility] with neg EKG/cxr/ spirometry and referred to pulmonary 04/30/2016     History of Present Illness  04/30/2016 1st [facility] office visit/ Yaritzel Stange   Chief Complaint  Patient presents with  . Pulmonary Consult    Referred by Chana Bode, PA. Pt c/o chest tightness for at least the past month.    since onset of worse chest tightness has been able to consistently able to work out including aerobics with mild  sob but no tightness while on ppi taking daily with protein shake.  Tightness can last all day long once it starts but usually take a cigarette to trigger it.  Throat clearing is variable, no cough on wakening/ does not disturb sleep nor does the tightness rec Pantoprazole (protonix) 40 mg   Take  30-60 min before first meal of the day and Pepcid (famotidine)  20 mg one @  bedtime  X one month and continue if better, if not call me GERD rx The key is to stop smoking completely before smoking completely stops you - it's really not too late!    12/15/2018  f/u ov/Hasel Janish re: [employer] daily due to chest tightness since 02/22/18 :helped some Chief Complaint  Patient presents with  . Follow-up    results of ct scan,   Dyspnea:  Able to work out fine 3 x weekly  Cough: none/ some throat clearing, chewing orbit gum Sleeping: fine  Bed flat SABA use: not needing rec Pantoprazole (protonix) 40 mg   Take  30-60 min before first meal of the day and Pepcid (famotidine)  20 mg one @  bedtime until return to office - this is the best way to tell whether stomach acid is contributing to your problem.   Try BREO  Off for now  The key is to stop smoking completely before smoking completely stops you! For smoking cessation  classes call [phone]   Please schedule a follow up office visit in 4 weeks, sooner if needed with pfts on return and no BREO that day     01/09/2019  f/u ov/Hedy Garro re: chest tightness better on ppi/ diet  Chief Complaint  Patient presents with  . Follow-up    PFT's done today. Breathing is doing well and no co's. He rarely uses his albuterol inhaler.   Dyspnea:  Not limited by breathing from desired activities  / does some aerobics s tightness in chest any more  Cough: freq thoat clearing he attributes to smoking  - worse the more he smokes Sleeping: ok flat / one pillow  SABA use: none rec Not smoking is key If the throat clearing is still bothering you options are  1) gabapentin 100 mg three times a day and adjusted up to a max 300 mg three times 2) see Dr Eliott Nine / voice center (call for referral) > never took gabapentin / never ent    Admit date: 10/15/2019 Discharge date: 10/17/2019  Admitted From: Home Disposition:  Discharged to home.  Recommendations for Outpatient Follow-up:  1. Follow up with PCP in 1 week 2. Please obtain BMP/CBC in one week  Discharge Condition: Stable  CODE STATUS: Discharged to home.  Brief/Interim Summary: Cody Shadow Meyerhofferis a 53 y.o.malewith medical history significant ofhypertension, anxiety, asthma/COPD, obesity, erectile dysfunction, GERD presents to emergency department due to worsening of shortness of breath and wheezing since 6 weeks.  Patient presented [facility] 2 weeks ago and was diagnosed with bronchitis and discharged home on p.o. steroids. CT angiogram was performed which came back negative for pulmonary embolism. He smokes 1 pack of cigarettes-trying to quit, drinks alcohol occasionally, denies illicit drug use. He had pulmonary function test on 01/09/2019 which showed minimal obstructive airway disease. Patient uses albuterol and Symbicort at home. Not on home oxygen.  Discharge Diagnoses:  Principal  Problem:   Acute respiratory failure with hypoxia (HCC) Active Problems:   GERD (gastroesophageal reflux disease)   Tobacco abuse   Hypertension   Anxiety  Acute hypoxic respiratory failure - Chest x-Halim negative, COVID-19 negative - CTA chest from 11/9: Signs of bronchial inflammation and air trapping with areas of pneumonitis at the lung bases raising the question of aspiration. Correlation with clinical signs of reflux is suggested. - multiple visits in the last month-6weeks for same symptoms - eval'd by pulm at beginning of the year for similar symptoms; was referred to [facility] voice center (did not follow up) - not on O2 at home; currently on 4L [place], wean as able - continue solumedrol, dulera, PRN albuterol - continue IS - have consulted SLP to eval for chronic silent aspiration; will go for MBS tomorrow - have ordered hi-res CT chest - have ordered echo; doesn't give the impression of volume overload at this time, but can not r/o cardiac involvement just yet - 11/24: says he is walking up and down the hall w/o O2; doesn't feel 100% but does feel much better     - MBS w/o signs of aspiration     - HRCT notes ground glass opacities c/w inflammation vs infection; adding lvq 500mg  x 5 days     - Echo unremarkable     - RVP is negative     - at this point I would call this an PNA of unknown etiology; I don't know that he has true COPD     - we spoke about continued follow up with PCP and pulmonology     - he should see GI about his reflux     - continue lvq and steroid taper; follow up with PCP next week     - spoke about scheduled inhalers and proper inhaler technique; as well as rescue/PRN inhalers; he voices understanding.   Hypertension - continue irbesartan  Tobacco abuse - nicotine patch - he says he's weaning tobacco use  Anxiety - hydroxyzine qHS PRN  GERD - continue protonix  Left renal cyst:  -  Incidental finding noted on CT angiogram - Follow-up outpatient with renal ultrasound    Virtual Visit via Telephone Note 10/18/2019   I connected with Cody Tapia on 10/18/19 at  9:45 AM EST by telephone and verified that I am speaking with the correct person using two identifiers.   I discussed the limitations, risks, security and privacy concerns of performing an evaluation and management service by telephone and the availability of in person appointments. I also discussed with the patient that there may be a patient responsible charge related to this service. The patient expressed understanding and agreed to proceed.   History of Present Illness: Onset cough  end of sept 2020 then really much worse x mid oct harsh dry ever since  Dyspnea:  No problem prior with aerobics / now able to get up steps  Cough: still brown mucus  Sleeping: fine elevated 45 degrees SABA use: not using any at baseline  02: none  Not smoking 5 days No nasal symptoms     No obvious day to day or daytime variability or assoc excess/ purulent sputum or mucus plugs or hemoptysis or cp or chest tightness, subjective wheeze or overt sinus or hb symptoms.    Also denies any obvious fluctuation of symptoms with weather or environmental changes or other aggravating or alleviating factors except as outlined above.   Meds reviewed/ med reconciliation completed        Observations/Objective: Sounds fine on the phone, good phonatin   Assessment and Plan: See problem list for active a/p's   Follow Up Instructions: See avs for instructions unique to this ov which includes revised/ updated med list     I discussed the assessment and treatment plan with the patient. The patient was provided an opportunity to ask questions and all were answered. The patient agreed with the plan and demonstrated an understanding of the instructions.   The patient was advised to call back or seek an in-person  evaluation if the symptoms worsen or if the condition fails to improve as anticipated.  I provided 25  minutes of non-face-to-face time during this encounter.   Christinia Gully, MD

## 2019-10-24 NOTE — Telephone Encounter (Signed)
Message has been completed

## 2019-11-01 ENCOUNTER — Ambulatory Visit: Payer: BC Managed Care – PPO | Admitting: Internal Medicine

## 2019-11-01 ENCOUNTER — Encounter: Payer: Self-pay | Admitting: Internal Medicine

## 2019-11-01 ENCOUNTER — Other Ambulatory Visit: Payer: Self-pay

## 2019-11-01 DIAGNOSIS — R05 Cough: Secondary | ICD-10-CM

## 2019-11-01 DIAGNOSIS — R058 Other specified cough: Secondary | ICD-10-CM

## 2019-11-01 DIAGNOSIS — J4531 Mild persistent asthma with (acute) exacerbation: Secondary | ICD-10-CM | POA: Diagnosis not present

## 2019-11-01 NOTE — Progress Notes (Signed)
Subjective:     Patient ID: Cody Tapia, male   DOB: [birth year]       MRN: [id number]    Brief patient profile:  8  yowm active smoker with variable chest tightness x at least since summer 2016 but more consistent x mid May 2017 made worse by smoking and eval by [facility] with neg EKG/cxr/ spirometry and referred to pulmonary 04/30/2016     History of Present Illness  04/30/2016 1st [facility] office visit/ [facility]   Chief Complaint  Patient presents with  . Pulmonary Consult    Referred by Chana Bode, PA. Pt c/o chest tightness for at least the past month.    since onset of worse chest tightness has been able to consistently able to work out including aerobics with mild  sob but no tightness while on ppi taking daily with protein shake.  Tightness can last all day long once it starts but usually take a cigarette to trigger it.  Throat clearing is variable, no cough on wakening/ does not disturb sleep nor does the tightness rec Pantoprazole (protonix) 40 mg   Take  30-60 min before first meal of the day and Pepcid (famotidine)  20 mg one @  bedtime  X one month and continue if better, if not call me GERD rx The key is to stop smoking completely before smoking completely stops you - it's really not too late!    12/15/2018  f/u ov/Wert re: [facility] daily due to chest tightness since 02/22/18 :helped some Chief Complaint  Patient presents with  . Follow-up    results of ct scan,   Dyspnea:  Able to work out fine 3 x weekly  Cough: none/ some throat clearing, chewing orbit gum Sleeping: fine  Bed flat SABA use: not needing rec Pantoprazole (protonix) 40 mg   Take  30-60 min before first meal of the day and Pepcid (famotidine)  20 mg one @  bedtime until return to office - this is the best way to tell whether stomach acid is contributing to your problem.   Try BREO  Off for now  The key is to stop smoking completely before smoking completely stops you! For smoking cessation  classes call [phone]   Please schedule a follow up office visit in 4 weeks, sooner if needed with pfts on return and no BREO that day     01/09/2019  f/u ov/Wert re: chest tightness better on ppi/ diet  Chief Complaint  Patient presents with  . Follow-up    PFT's done today. Breathing is doing well and no co's. He rarely uses his albuterol inhaler.   Dyspnea:  Not limited by breathing from desired activities  / does some aerobics s tightness in chest any more  Cough: freq thoat clearing he attributes to smoking  - worse the more he smokes Sleeping: ok flat / one pillow  SABA use: none rec Continue the reflux treatment for another 2 months after that defer to PCP or GI problems Not smoking is key If the throat clearing is still bothering you options are  1) gabapentin 100 mg three times a day and adjusted up to a max 300 mg three times 2) see Dr Eliott Nine / voice center (call for referral)  Admit date: 10/15/2019 Discharge date: 10/17/2019  Admitted From: Home Disposition:  Discharged to home.  Recommendations for Outpatient Follow-up:  1. Follow up with PCP in 1 week 2. Please obtain BMP/CBC in one week  Discharge Condition:  Stable  CODE STATUS: Discharged to home.   Brief/Interim Summary: Cody Tapia a 53 y.o.malewith medical history significant ofhypertension, anxiety, asthma/COPD, obesity, erectile dysfunction, GERD presents to emergency department due to worsening of shortness of breath and wheezing since 6 weeks.  Patient presented to [facility] ED 2 weeks ago and was diagnosed with bronchitis and discharged home on p.o. steroids. CT angiogram was performed which came back negative for pulmonary embolism. He smokes 1 pack of cigarettes-trying to quit, drinks alcohol occasionally, denies illicit drug use. He had pulmonary function test on 01/09/2019 which showed minimal obstructive airway disease. Patient uses albuterol and Symbicort at home. Not on  home oxygen.  Discharge Diagnoses:  Principal Problem:   Acute respiratory failure with hypoxia (HCC) Active Problems:   GERD (gastroesophageal reflux disease)   Tobacco abuse   Hypertension   Anxiety  Acute hypoxic respiratory failure - Chest x-Archer negative, COVID-19 negative - CTA chest from 11/9: Signs of bronchial inflammation and air trapping with areas of pneumonitis at the lung bases raising the question of aspiration. Correlation with clinical signs of reflux is suggested. - multiple visits in the last month-6weeks for same symptoms - eval'd by pulm at beginning of the year for similar symptoms; was referred to [facility] center (did not follow up) - not on O2 at home; currently on 4L [place], wean as able - continue solumedrol, dulera, PRN albuterol - continue IS - have consulted SLP to eval for chronic silent aspiration; will go for MBS tomorrow - have ordered hi-res CT chest - have ordered echo; doesn't give the impression of volume overload at this time, but can not r/o cardiac involvement just yet - 11/24: says he is walking up and down the hall w/o O2; doesn't feel 100% but does feel much better     - MBS w/o signs of aspiration     - HRCT notes ground glass opacities c/w inflammation vs infection; adding lvq 500mg  x 5 days     - Echo unremarkable     - RVP is negative     - at this point I would call this an PNA of unknown etiology; I don't know that he has true COPD     - we spoke about continued follow up with PCP and pulmonology     - he should see GI about his reflux     - continue lvq and steroid taper; follow up with PCP next week     - spoke about scheduled inhalers and proper inhaler technique; as well as rescue/PRN inhalers; he voices understanding.   Hypertension - continue irbesartan  Tobacco abuse - nicotine patch - he says he's weaning tobacco use  Anxiety - hydroxyzine qHS  PRN  GERD - continue protonix  Left renal cyst:  - Incidental finding noted on CT angiogram - Follow-up outpatient with renal ultrasound  Discharge Instructions       Allergies as of 10/17/2019      Reactions   Chantix [varenicline Tartrate]    Wellbutrin [bupropion]    Sleepwalking, intolerance         Medication List    TAKE these medications   albuterol 108 (90 Base) MCG/ACT inhaler Commonly known as: VENTOLIN HFA Inhale 2 puffs into the lungs every 6 (six) hours as needed for wheezing.   albuterol (2.5 MG/3ML) 0.083% nebulizer solution Commonly known as: PROVENTIL Take 3 mLs (2.5 mg total) by nebulization every 6 (six) hours as needed for wheezing or shortness of  breath.   budesonide-formoterol 160-4.5 MCG/ACT inhaler Commonly known as: Symbicort Inhale 2 puffs into the lungs 2 (two) times daily.   irbesartan 300 MG tablet Commonly known as: AVAPRO Take 1 tablet (300 mg total) by mouth daily.   levofloxacin 500 MG tablet Commonly known as: LEVAQUIN Take 1 tablet (500 mg total) by mouth daily for 5 days. Start taking on: October 18, 2019   multivitamin tablet Take 1 tablet by mouth daily.   nicotine 14 mg/24hr patch Commonly known as: NICODERM CQ - dosed in mg/24 hours Place 1 patch (14 mg total) onto the skin daily for 14 days. Start taking on: October 18, 2019   pantoprazole 40 MG tablet Commonly known as: PROTONIX Take 1 tablet (40 mg total) by mouth daily.   predniSONE 10 MG tablet Commonly known as: DELTASONE Take 4 tablets (40 mg total) by mouth daily for 3 days, THEN 2 tablets (20 mg total) daily for 2 days, THEN 1 tablet (10 mg total) daily for 2 days. Start taking on: October 17, 2019   tadalafil 20 MG tablet Commonly known as: CIALIS Take 1 tablet (20 mg total) by mouth daily. as directed   valACYclovir 1000 MG tablet Commonly known as: VALTREX Take 1,000 mg by mouth daily.      televisit rec  11/252020 History of Present Illness: post hosp d/c  Onset cough  end of sept 2020 then really much worse x mid oct harsh dry ever since Dyspnea:  No problem prior with aerobics / now able to get up steps  Cough: still brown mucus  Sleeping: fine elevated 45 degrees SABA use: not using any at baseline  02: none  Not smoking 5 days No nasal symptoms   Protonix 40 mg (or prilosec)  Take 30- 60 min before your first and last meals of the day  GERD  Diet  Best cough medication > mucinex dm 1200 mg up to twice daily as needed  Finish antibotics and prednisone  Just use symbicort 160 up to 2 every 12 hours if short of breath Please schedule a follow up office visit in 2 weeks, sooner if needed  with all medications /inhalers/ solutions in hand so we can verify exactly what you are taking. This includes all medications from all doctors and over the counters    11/01/2019  f/u ov/Wert re:  Last prednisone 10/24/2019 and last cig 10/14/19  Chief Complaint  Patient presents with  . Follow-up    Cough has resovled and he is breathing much better. He has not used his albuterol recently.   Dyspnea:  elipitical  10 min whereas usually does 15 min harder/ outdoor walking 30 min  Cough: none  Sleeping: no problem 45 degrees whereas as normal  SABA use: one day prior used symb 160 x 2  02: none    No obvious day to day or daytime variability or assoc excess/ purulent sputum or mucus plugs or hemoptysis or cp or chest tightness, subjective wheeze or overt sinus or hb symptoms.   sleeping as above without nocturnal  or early am exacerbation  of respiratory  c/o's or need for noct saba. Also denies any obvious fluctuation of symptoms with weather or environmental changes or other aggravating or alleviating factors except as outlined above   No unusual exposure hx or h/o childhood pna/ asthma or knowledge of premature birth.  Current Allergies, Complete Past Medical History, Past Surgical History,  Family History, and Social History were reviewed in [employer]  electronic medical record.  ROS  The following are not active complaints unless bolded Hoarseness, sore throat, dysphagia, dental problems, itching, sneezing,  nasal congestion or discharge of excess mucus or purulent secretions, ear ache,   fever, chills, sweats, unintended wt loss or wt gain, classically pleuritic or exertional cp,  orthopnea pnd or arm/hand swelling  or leg swelling, presyncope, palpitations, abdominal pain, anorexia, nausea, vomiting, diarrhea  or change in bowel habits or change in bladder habits, change in stools or change in urine, dysuria, hematuria,  rash, arthralgias, visual complaints, headache, numbness, weakness or ataxia or problems with walking or coordination,  change in mood or  memory.        Current Meds  Medication Sig  . albuterol (PROVENTIL) (2.5 MG/3ML) 0.083% nebulizer solution Take 3 mLs (2.5 mg total) by nebulization every 6 (six) hours as needed for wheezing or shortness of breath.  Marland Kitchen albuterol (VENTOLIN HFA) 108 (90 Base) MCG/ACT inhaler Inhale 2 puffs into the lungs every 6 (six) hours as needed for wheezing.  . budesonide-formoterol (SYMBICORT) 160-4.5 MCG/ACT inhaler Inhale 2 puffs into the lungs 2 (two) times daily.  . irbesartan (AVAPRO) 300 MG tablet Take 1 tablet (300 mg total) by mouth daily.  . Multiple Vitamin (MULTIVITAMIN) tablet Take 1 tablet by mouth daily.  . nicotine (NICODERM CQ - DOSED IN MG/24 HOURS) 14 mg/24hr patch Place 1 patch (14 mg total) onto the skin daily for 14 days.  . pantoprazole (PROTONIX) 40 MG tablet Take 1 tablet (40 mg total) by mouth daily.  . tadalafil (CIALIS) 20 MG tablet Take 1 tablet (20 mg total) by mouth daily. as directed  . valACYclovir (VALTREX) 1000 MG tablet Take 1,000 mg by mouth daily.                   Objective:   Physical Exam   amb wm nad  11/01/2019       226  01/09/2019       209   12/15/2018      221   04/30/16 220 lb  (99.791 kg)  04/22/16 217 lb (98.431 kg)  12/09/15 211 lb (95.709 kg)        HEENT : pt wearing mask not removed for exam due to covid -19 concerns.    NECK :  without JVD/Nodes/TM/ nl carotid upstrokes bilaterally   LUNGS: no acc muscle use,  Nl contour chest which is clear to A and P bilaterally without cough on insp or exp maneuvers   CV:  RRR  no s3 or murmur or increase in P2, and no edema   ABD:  soft and nontender with nl inspiratory excursion in the supine position. No bruits or organomegaly appreciated, bowel sounds nl  MS:  Nl gait/ ext warm without deformities, calf tenderness, cyanosis or clubbing No obvious joint restrictions   SKIN: warm and dry without lesions    NEURO:  alert, approp, nl sensorium with  no motor or cerebellar deficits apparent.            Assessment:

## 2019-11-01 NOTE — Patient Instructions (Addendum)
No change in recommendations for now including mucinex dm 1200 mg every 12 hours or delsym but not both as needed for cough.   To get the most out of exercise, you need to be continuously aware that you are short of breath, but never out of breath, for 30 minutes daily. As you improve, it will actually be easier for you to do the same amount of exercise  in  30 minutes so always push to the level where you are short of breath.    Please schedule a follow up visit in 3 months but call sooner if needed

## 2019-11-02 ENCOUNTER — Ambulatory Visit: Payer: BC Managed Care – PPO | Admitting: Cardiology

## 2019-11-02 ENCOUNTER — Encounter: Payer: Self-pay | Admitting: Cardiology

## 2019-11-02 ENCOUNTER — Encounter: Payer: Self-pay | Admitting: Internal Medicine

## 2019-11-02 VITALS — BP 118/85 | HR 82 | Temp 96.8°F | Ht 71.0 in | Wt 225.6 lb

## 2019-11-02 DIAGNOSIS — E7800 Pure hypercholesterolemia, unspecified: Secondary | ICD-10-CM | POA: Diagnosis not present

## 2019-11-02 DIAGNOSIS — Z720 Tobacco use: Secondary | ICD-10-CM | POA: Diagnosis not present

## 2019-11-02 DIAGNOSIS — J45901 Unspecified asthma with (acute) exacerbation: Secondary | ICD-10-CM | POA: Insufficient documentation

## 2019-11-02 DIAGNOSIS — I712 Thoracic aortic aneurysm, without rupture, unspecified: Secondary | ICD-10-CM

## 2019-11-02 DIAGNOSIS — I10 Essential (primary) hypertension: Secondary | ICD-10-CM

## 2019-11-02 MED ORDER — IRBESARTAN 300 MG PO TABS: 300.0000 mg | ORAL_TABLET | Freq: Every day | ORAL | 3 refills | Status: DC

## 2019-11-02 NOTE — Assessment & Plan Note (Addendum)
Onset ? 2017 assoc with chest tightness Spirometry 04/22/16 no sign airflow obst, very minimal  bowing in f/v loop  - rec max gerd rx 04/30/2016  ? Response - Spirometry  02/22/18   FEV1 3.0 (73%)  Ratio 79 s curvature  > started breo which helped chest tightness did not worsen cough - 01/09/2019 ov: cough improved on gerd rx  - PFT's  01/09/2019  FEV1 2.93  (72% ) ratio 0.75  p no % improvement from saba p nothing  prior to study with DLCO  90 % corrects to 107  % for alv volume  And  Very minimal  curvature   adeq rx = gerd rx/ maintain off cigs

## 2019-11-02 NOTE — Progress Notes (Signed)
Primary Physician/Referring:  Carlena Hurl, PA-C  Patient ID: Cody Tapia, male    DOB: [birth year], 53 y.o.   MRN: [id number]  Chief Complaint  Patient presents with  . AAA  . Follow-up   HPI:    Cody Tapia  is a 53 y.o. male  with chronic tobacco use, mild obesity, hyperlipidemia, tobacco use disorder, smokes about 1 pack of cigarettes a day and erectile dysfunction, CT scan of the chest on 11/18/2018 demonstrated mid ascending thoracic aorta measuring 40 mm indicating fusiform dilatation, no coronary artery calcification, but found calcified granulomas in the right upper lobe, azygos lobe, and right hilum-mediastinum. Patient denies a family history of aneursyms.   He presented to the emergency room 2 weeks ago with shortness of breath and diagnosed with acute respiratory tract infection, was evaluated by Dr. Melvyn Novas on 10/18/2019, yesterday and diagnosed by PFTs to have mild obstructive airway disease and hypoxemia.  CT scan also had.  Areas of pneumonitis at the lung bases with question regarding aspiration.  Patient's symptoms of cough and dyspnea has improved since he quit smoking and has been sleeping slightly upright to avoid GERD and is also on a PPI.  States that he has started to feel better.  No chest pain or palpitations.  Past Medical History:  Diagnosis Date  . 23-polyvalent pneumococcal polysaccharide vaccine declined 7/14  . Abnormal lung field   . Anxiety    prior therapy, in remission  . Asthma   . Atypical chest pain   . ED (erectile dysfunction)   . GERD (gastroesophageal reflux disease)   . GERD (gastroesophageal reflux disease)   . Hypercholesteremia   . Hyperglycemia   . Hypertension   . Obesity   . Thoracic ascending aortic aneurysm ([place])   . Tobacco use   . Wears glasses    Past Surgical History:  Procedure Laterality Date  . COLONOSCOPY  2009   [facility] for abdominal pain  . ESOPHAGOGASTRODUODENOSCOPY  2009   [facility],  abdominal pain  . EYE SURGERY     strabismus surgery, right  . WISDOM TOOTH EXTRACTION     Social History   Socioeconomic History  . Marital status: Single    Spouse name: Not on file  . Number of children: 1  . Years of education: Not on file  . Highest education level: Not on file  Occupational History  . Not on file  Tobacco Use  . Smoking status: Former Smoker    Packs/day: 0.25    Years: 30.00    Pack years: 7.50    Types: Cigarettes    Quit date: 10/14/2019    Years since quitting: 0.0  . Smokeless tobacco: Never Used  Substance and Sexual Activity  . Alcohol use: Yes    Alcohol/week: 3.0 standard drinks    Types: 3 Glasses of wine per week  . Drug use: No  . Sexual activity: Not on file  Other Topics Concern  . Not on file  Social History Narrative   Girlfriend, domestic partner, teenage daughter.   Exercise - cardio, elliptical, power walking, free weights 3 days per week.  Working out with Physiological scientist.  Mortgage lending x 20 years.     Social Determinants of Health   Financial Resource Strain:   . Difficulty of Paying Living Expenses: Not on file  Food Insecurity:   . Worried About Charity fundraiser in the Last Year: Not on file  . Ran Out of  Food in the Last Year: Not on file  Transportation Needs:   . Lack of Transportation (Medical): Not on file  . Lack of Transportation (Non-Medical): Not on file  Physical Activity:   . Days of Exercise per Week: Not on file  . Minutes of Exercise per Session: Not on file  Stress:   . Feeling of Stress : Not on file  Social Connections:   . Frequency of Communication with Friends and Family: Not on file  . Frequency of Social Gatherings with Friends and Family: Not on file  . Attends Religious Services: Not on file  . Active Member of Clubs or Organizations: Not on file  . Attends Archivist Meetings: Not on file  . Marital Status: Not on file  Intimate Partner Violence:   . Fear of Current or  Ex-Partner: Not on file  . Emotionally Abused: Not on file  . Physically Abused: Not on file  . Sexually Abused: Not on file   ROS  Review of Systems  Constitution: Negative for chills, decreased appetite, malaise/fatigue and weight gain.  Cardiovascular: Negative for dyspnea on exertion, leg swelling and syncope.  Endocrine: Negative for cold intolerance.  Hematologic/Lymphatic: Does not bruise/bleed easily.  Musculoskeletal: Negative for joint swelling.  Gastrointestinal: Negative for abdominal pain, anorexia, change in bowel habit, hematochezia and melena.  Neurological: Negative for headaches and light-headedness.  Psychiatric/Behavioral: Negative for depression and substance abuse.  All other systems reviewed and are negative.  Objective  Blood pressure 118/85, pulse 82, temperature (!) 96.8 F (36 C), height 5' 11" (1.803 m), weight 225 lb 9.6 oz (102.3 kg), SpO2 96 %.  Vitals with BMI 11/02/2019 11/01/2019 10/17/2019  Height 5' 11" 5' 11" -  Weight 225 lbs 10 oz 226 lbs 13 oz -  BMI 31.43 88.87 -  Systolic 579 728 206  Diastolic 85 64 92  Pulse 82 86 80    Physical Exam  Constitutional:  He is well-built and mildly obese in no acute distress.  HENT:  Head: Atraumatic.  Eyes: Conjunctivae are normal.  Neck: No thyromegaly present.  Cardiovascular: Normal rate, regular rhythm, normal heart sounds and intact distal pulses. Exam reveals no gallop.  No murmur heard. Pulmonary/Chest: Effort normal. He has rales (bilateral bases).  Abdominal: Soft. Bowel sounds are normal.  Musculoskeletal:        General: Normal range of motion.     Cervical back: Neck supple.  Neurological: He is alert.  Skin: Skin is warm and dry.  Psychiatric: He has a normal mood and affect.   Laboratory examination:   Recent Labs    10/15/19 1650 10/16/19 0714 10/17/19 0311  NA 136 138 138  K 4.0 4.8 4.8  CL 102 102 105  CO2 _0 GLUCOSE 104* 148* 135*  BUN 15 17 26*  CREATININE  1.17 1.06 1.02  CALCIUM 9.5 9.4 9.5  GFRNONAA >60 >60 >60  GFRAA >60 >60 >60   estimated creatinine clearance is 104.3 mL/min (by C-G formula based on SCr of 1.02 mg/dL).  CMP Latest Ref Rng & Units 10/17/2019 10/16/2019 10/15/2019  Glucose 70 - 99 mg/dL 135(H) 148(H) 104(H)  BUN 6 - 20 mg/dL 26(H) 17 15  Creatinine 0.61 - 1.24 mg/dL 1.02 1.06 1.17  Sodium 135 - 145 mmol/L 138 138 136  Potassium 3.5 - 5.1 mmol/L 4.8 4.8 4.0  Chloride 98 - 111 mmol/L 105 102 102  CO2 22 - 32 mmol/L _1 Calcium 8.9 -  10.3 mg/dL 9.5 9.4 9.5  Total Protein 6.5 - 8.1 g/dL 6.7 - 7.6  Total Bilirubin 0.3 - 1.2 mg/dL 0.7 - 0.7  Alkaline Phos 38 - 126 U/L 86 - 118  AST 15 - 41 U/L 16 - 25  ALT 0 - 44 U/L 23 - 32   CBC Latest Ref Rng & Units 10/17/2019 10/16/2019 10/15/2019  WBC 4.0 - 10.5 K/uL 20.6(H) 11.2(H) 16.1(H)  Hemoglobin 13.0 - 17.0 g/dL 14.4 15.6 16.3  Hematocrit 39.0 - 52.0 % 43.0 46.2 48.2  Platelets 150 - 400 K/uL 274 270 290   Lipid Panel     Component Value Date/Time   CHOL 217 (H) 03/07/2018 1430   TRIG 52 03/07/2018 1430   HDL 64 03/07/2018 1430   CHOLHDL 3.4 03/07/2018 1430   CHOLHDL 4.3 01/14/2017 0914   VLDL 14 01/14/2017 0914   LDLCALC 143 (H) 03/07/2018 1430   HEMOGLOBIN A1C Lab Results  Component Value Date   HGBA1C 5.7 (H) 03/07/2018   MPG 105 01/14/2017   TSH No results for input(s): TSH in the last 8760 hours.   Labs 03/07/2018: Serum glucose 98 mg, BUN 18, creatinine 1.13, eGFR greater than 69, potassium 4.2.  CMP otherwise normal.  CBC normal, total cholesterol 217, triglycerides 52, HDL 64, LDL 143.  Non-HDL cholesterol 165.  A1c 5.7%.  Normal TSH in 2018.   Medications and allergies   Allergies  Allergen Reactions  . Chantix [Varenicline Tartrate]   . Wellbutrin [Bupropion]     Sleepwalking, intolerance     Current Outpatient Medications  Medication Instructions  . albuterol (PROVENTIL) 2.5 mg, Nebulization, Every 6 hours PRN  . albuterol  (VENTOLIN HFA) 108 (90 Base) MCG/ACT inhaler 2 puffs, Inhalation, Every 6 hours PRN  . budesonide-formoterol (SYMBICORT) 160-4.5 MCG/ACT inhaler 2 puffs, Inhalation, 2 times daily  . irbesartan (AVAPRO) 300 mg, Oral, Daily  . Multiple Vitamin (MULTIVITAMIN) tablet 1 tablet, Oral, Daily  . pantoprazole (PROTONIX) 40 mg, Oral, Daily  . tadalafil (CIALIS) 20 mg, Oral, Daily, as directed  . valACYclovir (VALTREX) 1,000 mg, Oral, As needed    Radiology:  No results found. Cardiac Studies:   Echocardiogram 01/17/2019: Left ventricle cavity is normal in size. Mild concentric hypertrophy of the left ventricle. Normal global wall motion. Normal diastolic filling pattern. Calculated EF 51%. No significant valvular abnormalities. IVC is dilated with respiratory variation. This may suggest elevated right heart pressure   Exercise sestamibi stress test 01/06/2019: 1. The patient performed treadmill exercise using Bruce protocol, completing 9:37 minutes. The patient completed an estimated workload of 11.1 METS, reaching 92% of the maximum predicted heart rate. Exercise capacity was excellent. Hemodynamic response was normal. Stress symptoms included fatigue. No ischemic changes seen on stress electrocardiogram. 2. The overall quality of the study is excellent. There is no evidence of abnormal lung activity. Stress and rest SPECT images demonstrate homogeneous tracer distribution throughout the myocardium. Gated SPECT imaging reveals normal myocardial thickening and wall motion. The left ventricular ejection fraction was normal (66%).   3. Low risk study.  CTA chest 10/16/2019: 1. Examination is generally limited by breath motion artifact. Within this limitation, there are scattered, generally peribronchovascular and somewhat geographic ground-glass opacities, which are fluctuant in comparison to prior examination dated 10/02/2019 (series 10, image 96, 61). Mild bronchial wall thickening. No significant air  trapping on expiratory phase imaging. These opacities are nonspecific and infectious or inflammatory given fluctuant nature. No evidence of fibrotic interstitial lung disease. 2. 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 3. The tubular ascending thoracic aorta is enlarged, measuring 4.2 x4.1 cm, not significantly changed compared to prior examinations, CTA Chest  11/18/2018   Assessment     ICD-10-CM   1. Essential hypertension  I10   2. Thoracic aortic aneurysm without rupture (HCC)  I71.2   3. Tobacco use  Z72.0     EKG 10/15/2019: Normal sinus rhythm at a rate of 87 bpm, normal axis, no evidence of ischemia.  Baseline artifact.    Recommendations:  No orders of the defined types were placed in this encounter.   Cody Tapia  is a 53 y.o. EKG 1122 male  with chronic tobacco use, mild obesity, hyperlipidemia, tobacco use disorder, smokes about 1 pack of cigarettes a day and erectile dysfunction, CT scan of the chest on 11/18/2018 demonstrated mid ascending thoracic aorta measuring 40 mm indicating fusiform dilatation, no coronary artery calcification, but found calcified granulomas in the right upper lobe, azygos lobe, and right hilum-mediastinum. Patient denies a family history of aneursyms.   Patient an appointment to see me for discussions regarding aortic aneurysm, hypertension and also tobacco use which she has quit as of 10/19/2019.  I reviewed all his medical records from the emergency room visit and also his evaluation by pulmonary medicine, being worked up for aspiration pneumonia/pneumonitis.  I given him positive reinforcement.  Blood pressure is well controlled.  I also discussed regarding his hyperlipidemia, he would like to try lifestyle modification 1st.  I would like to see him back in 3 months and if LDL is still elevated, will consider starting him on a statin.  With regard to aortic aneurysm, he was scheduled to have a CT scan in March 2021,  we will postponed due to December 2021.  Aortic aneurysm is remained stable.  Since he quit smoking, I have discussed with him regarding weight loss and avoidance of high calorie diet. "Total time spent with patient was  40 minutes and greater than 50% of that time was spent in face to face discussion, counseling"   I do hear bilateral faint crackles, with abnormal CT scan, pulmonary fibrosis is something to be kept in the background.  Jay Ganji, MD, FACC 11/02/2019, 9:24 AM [facility] [place] Pager: [phone] Office: [phone] If no answer Cell [phone] 

## 2019-11-02 NOTE — Assessment & Plan Note (Addendum)
See admit 51/76/16 complicated by resp failure   All goals of chronic asthma control met including optimal function and elimination of symptoms with minimal need for rescue therapy.  Contingencies discussed in full including contacting this office immediately if not controlling the symptoms using the rule of two's.     - The proper method of use, as well as anticipated side effects, of a metered-dose inhaler are discussed and demonstrated to the patient.      Also appears to have conditioning issues but clearly this is not copd.    I reviewed the Fletcher curve with the patient that basically indicates  if you quit smoking when your best day FEV1 is still well preserved (as is clearly  the case here)  it is highly unlikely you will progress to severe disease and informed the patient there was  no medication on the market that has proven to alter the curve/ its downward trajectory  or the likelihood of progression of their disease(unlike other chronic medical conditions such as atheroclerosis where we do think we can change the natural hx with risk reducing meds)    Therefore   Maintaining cigarette  abstinence is  the most important aspects of hi care, not choice of inhalers or for that matter, doctors.   Treatment other than smoking cessation  is entirely directed by severity of symptoms and focused also on reducing exacerbations,  So plan to continue symb    >>> f/u in 3 m , sooner prn   I had an extended discussion with the patient reviewing all relevant studies completed to date and  lasting 15 to 20 minutes of a 25 minute visit    I performed detailed device teaching using a teach back method which extended face to face time for this visit (see above)  Each maintenance medication was reviewed in detail including emphasizing most importantly the difference between maintenance and prns and under what circumstances the prns are to be triggered using an action plan format that is not  reflected in the computer generated alphabetically organized AVS which I have not found useful in most complex patients, especially with respiratory illnesses  Please see AVS for specific instructions unique to this visit that I personally wrote and verbalized to the the pt in detail and then reviewed with pt  by my nurse highlighting any  changes in therapy recommended at today's visit to their plan of care.

## 2019-11-09 ENCOUNTER — Other Ambulatory Visit: Payer: Self-pay | Admitting: Medical

## 2019-11-09 ENCOUNTER — Telehealth: Payer: Self-pay | Admitting: Family Medicine

## 2019-11-09 DIAGNOSIS — J45909 Unspecified asthma, uncomplicated: Secondary | ICD-10-CM | POA: Diagnosis not present

## 2019-11-09 DIAGNOSIS — R0602 Shortness of breath: Secondary | ICD-10-CM | POA: Diagnosis not present

## 2019-11-09 MED ORDER — NICOTINE 21 MG/24HR TD PT24: 21.0000 mg | MEDICATED_PATCH | Freq: Every day | TRANSDERMAL | 0 refills | Status: DC

## 2019-11-09 NOTE — Telephone Encounter (Signed)
Pt called for Step 1 patches for quitting smoking.  The hospital called in Step 2, but he needs step 1 first to [place] on [address].

## 2019-11-09 NOTE — Telephone Encounter (Signed)
done

## 2019-11-24 DIAGNOSIS — Z87891 Personal history of nicotine dependence: Secondary | ICD-10-CM

## 2019-11-24 HISTORY — DX: Personal history of nicotine dependence: Z87.891

## 2019-11-28 ENCOUNTER — Encounter: Payer: BC Managed Care – PPO | Admitting: Medical

## 2019-12-02 DIAGNOSIS — R062 Wheezing: Secondary | ICD-10-CM | POA: Diagnosis not present

## 2019-12-02 DIAGNOSIS — Z1152 Encounter for screening for COVID-19: Secondary | ICD-10-CM | POA: Diagnosis not present

## 2019-12-13 ENCOUNTER — Encounter: Payer: Self-pay | Admitting: Internal Medicine

## 2019-12-13 ENCOUNTER — Ambulatory Visit: Payer: BC Managed Care – PPO | Admitting: Internal Medicine

## 2019-12-13 ENCOUNTER — Other Ambulatory Visit: Payer: Self-pay

## 2019-12-13 VITALS — BP 104/72 | HR 92 | Temp 97.6°F | Ht 71.0 in | Wt 232.0 lb

## 2019-12-13 DIAGNOSIS — J4530 Mild persistent asthma, uncomplicated: Secondary | ICD-10-CM | POA: Diagnosis not present

## 2019-12-13 DIAGNOSIS — Z23 Encounter for immunization: Secondary | ICD-10-CM

## 2019-12-13 DIAGNOSIS — R05 Cough: Secondary | ICD-10-CM | POA: Diagnosis not present

## 2019-12-13 DIAGNOSIS — R058 Other specified cough: Secondary | ICD-10-CM

## 2019-12-13 MED ORDER — BUDESONIDE-FORMOTEROL FUMARATE 160-4.5 MCG/ACT IN AERO: 2.0000 | INHALATION_SPRAY | Freq: Two times a day (BID) | RESPIRATORY_TRACT | 11 refills | Status: DC

## 2019-12-13 NOTE — Progress Notes (Signed)
Subjective:     Patient ID: Cody Tapia, male   DOB: 1966/08/16       MRN: [id number]    Brief patient profile:  53  yowm quit smoking 09/2019  with variable chest tightness x at least since summer 2016 but more consistent x mid May 2017 made worse by smoking and eval by Cody Tapia with neg EKG/cxr/ spirometry and referred to pulmonary 04/30/2016     History of Present Illness  04/30/2016 1st [facility] office visit/ Cody Tapia   Chief Complaint  Patient presents with  . Pulmonary Consult    Referred by Cody Bode, PA. Pt c/o chest tightness for at least the past month.    since onset of worse chest tightness has been able to consistently able to work out including aerobics with mild  sob but no tightness while on ppi taking daily with protein shake.  Tightness can last all day long once it starts but usually take a cigarette to trigger it.  Throat clearing is variable, no cough on wakening/ does not disturb sleep nor does the tightness rec Pantoprazole (protonix) 40 mg   Take  30-60 min before first meal of the day and Pepcid (famotidine)  20 mg one @  bedtime  X one month and continue if better, if not call me GERD rx The key is to stop smoking completely before smoking completely stops you - it's really not too late!    12/15/2018  f/u ov/Cody Tapia re: Memory Dance daily due to chest tightness since 02/22/18 :helped some Chief Complaint  Patient presents with  . Follow-up    results of ct scan,   Dyspnea:  Able to work out fine 3 x weekly  Cough: none/ some throat clearing, chewing orbit gum Sleeping: fine  Bed flat SABA use: not needing rec Pantoprazole (protonix) 40 mg   Take  30-60 min before first meal of the day and Pepcid (famotidine)  20 mg one @  bedtime until return to office - this is the best way to tell whether stomach acid is contributing to your problem.   Try BREO  Off for now  The key is to stop smoking completely before smoking completely stops you! For smoking  cessation classes call [phone]   Please schedule a follow up office visit in 4 weeks, sooner if needed with pfts on return and no BREO that day     01/09/2019  f/u ov/Cody Tapia re: chest tightness better on ppi/ diet  Chief Complaint  Patient presents with  . Follow-up    PFT's done today. Breathing is doing well and no co's. He rarely uses his albuterol inhaler.   Dyspnea:  Not limited by breathing from desired activities  / does some aerobics s tightness in chest any more  Cough: freq thoat clearing he attributes to smoking  - worse the more he smokes Sleeping: ok flat / one pillow  SABA use: none rec Continue the reflux treatment for another 2 months after that defer to PCP or GI problems Not smoking is key If the throat clearing is still bothering you options are  1) gabapentin 100 mg three times a day and adjusted up to a max 300 mg three times 2) see Dr Cody Tapia / voice center (call for referral)  Admit date: 10/15/2019 Discharge date: 10/17/2019     Brief/Interim Summary: Cody Shadow Meyerhofferis a 54 y.o.malewith medical history significant ofhypertension, anxiety, asthma/COPD, obesity, erectile dysfunction, GERD presents to emergency department due to worsening of shortness  of breath and wheezing since 6 weeks.  Patient presented to [facility] ED 2 weeks ago and was diagnosed with bronchitis and discharged home on p.o. steroids. CT angiogram was performed which came back negative for pulmonary embolism. He smokes 1 pack of cigarettes-trying to quit, drinks alcohol occasionally, denies illicit drug use. He had pulmonary function test on 01/09/2019 which showed minimal obstructive airway disease. Patient uses albuterol and Symbicort at home. Not on home oxygen.  Discharge Diagnoses:  Principal Problem:   Acute respiratory failure with hypoxia (HCC) Active Problems:   GERD (gastroesophageal reflux disease)   Tobacco abuse   Hypertension   Anxiety  Acute hypoxic  respiratory failure - Chest x-Nochum negative, COVID-19 negative - CTA chest from 11/9: Signs of bronchial inflammation and air trapping with areas of pneumonitis at the lung bases raising the question of aspiration. Correlation with clinical signs of reflux is suggested. - multiple visits in the last month-6weeks for same symptoms - eval'd by pulm at beginning of the year for similar symptoms; was referred to [facility] (did not follow up) - not on O2 at home; currently on 4L [place], wean as able - continue solumedrol, dulera, PRN albuterol - continue IS - have consulted SLP to eval for chronic silent aspiration; will go for MBS tomorrow - have ordered hi-res CT chest - have ordered echo; doesn't give the impression of volume overload at this time, but can not r/o cardiac involvement just yet - 11/24: says he is walking up and down the hall w/o O2; doesn't feel 100% but does feel much better     - MBS w/o signs of aspiration     - HRCT notes ground glass opacities c/w inflammation vs infection; adding lvq 500mg  x 5 days     - Echo unremarkable     - RVP is negative     - at this point I would call this an PNA of unknown etiology; I don't know that he has true COPD     - we spoke about continued follow up with PCP and pulmonology     - he should see GI about his reflux     - continue lvq and steroid taper; follow up with PCP next week     - spoke about scheduled inhalers and proper inhaler technique; as well as rescue/PRN inhalers; he voices understanding.   Hypertension - continue irbesartan  Tobacco abuse - nicotine patch - he says he's weaning tobacco use  Anxiety - hydroxyzine qHS PRN  GERD - continue protonix  Left renal cyst:  - Incidental finding noted on CT angiogram - Follow-up outpatient with renal ultrasound  Discharge Instructions       Allergies as of 10/17/2019      Reactions    Chantix [varenicline Tartrate]    Wellbutrin [bupropion]    Sleepwalking, intolerance         Medication List    TAKE these medications   albuterol 108 (90 Base) MCG/ACT inhaler Commonly known as: VENTOLIN HFA Inhale 2 puffs into the lungs every 6 (six) hours as needed for wheezing.   albuterol (2.5 MG/3ML) 0.083% nebulizer solution Commonly known as: PROVENTIL Take 3 mLs (2.5 mg total) by nebulization every 6 (six) hours as needed for wheezing or shortness of breath.   budesonide-formoterol 160-4.5 MCG/ACT inhaler Commonly known as: Symbicort Inhale 2 puffs into the lungs 2 (two) times daily.   irbesartan 300 MG tablet Commonly known as: AVAPRO Take 1 tablet (300 mg  total) by mouth daily.   levofloxacin 500 MG tablet Commonly known as: LEVAQUIN Take 1 tablet (500 mg total) by mouth daily for 5 days. Start taking on: October 18, 2019   multivitamin tablet Take 1 tablet by mouth daily.   nicotine 14 mg/24hr patch Commonly known as: NICODERM CQ - dosed in mg/24 hours Place 1 patch (14 mg total) onto the skin daily for 14 days. Start taking on: October 18, 2019   pantoprazole 40 MG tablet Commonly known as: PROTONIX Take 1 tablet (40 mg total) by mouth daily.   predniSONE 10 MG tablet Commonly known as: DELTASONE Take 4 tablets (40 mg total) by mouth daily for 3 days, THEN 2 tablets (20 mg total) daily for 2 days, THEN 1 tablet (10 mg total) daily for 2 days. Start taking on: October 17, 2019   tadalafil 20 MG tablet Commonly known as: CIALIS Take 1 tablet (20 mg total) by mouth daily. as directed   valACYclovir 1000 MG tablet Commonly known as: VALTREX Take 1,000 mg by mouth daily.      televisit rec 11/252020 History of Present Illness: post hosp d/c  Onset cough  end of sept 2020 then really much worse x mid oct harsh dry ever since Dyspnea:  No problem prior with aerobics / now able to get up steps  Cough: still brown mucus   Sleeping: fine elevated 45 degrees SABA use: not using any at baseline  02: none  Not smoking 5 days No nasal symptoms   Protonix 40 mg (or prilosec)  Take 30- 60 min before your first and last meals of the day  GERD  Diet  Best cough medication > mucinex dm 1200 mg up to twice daily as needed  Finish antibotics and prednisone  Just use symbicort 160 up to 2 every 12 hours if short of breath Please schedule a follow up office visit in 2 weeks, sooner if needed  with all medications /inhalers/ solutions in hand so we can verify exactly what you are taking. This includes all medications from all doctors and over the counters    11/01/2019  f/u ov/Cody Tapia re:  Last prednisone 10/24/2019 and last cig 10/14/19  Chief Complaint  Patient presents with  . Follow-up    Cough has resovled and he is breathing much better. He has not used his albuterol recently.   Dyspnea:  elipitical  10 min whereas usually does 15 min harder/ outdoor walking 30 min  Cough: none  Sleeping: no problem 45 degrees whereas as normal  SABA use: one day prior used symb 160 x 2  02: none  rec No change in recommendations for now including mucinex dm 1200 mg every 12 hours or delsym but not both as needed for cough. To get the most out of exercise, you need to be continuously aware that you are short of breath, but never out of breath, for 30 minutes daily.    12/13/2019  f/u ov/Cody Tapia re:  AB Chief Complaint  Patient presents with  . Follow-up    Breathing is unchanged since the last visit. He states he wheezes if he does not use the symbicort. He has not had to use his albuterol inhaler or neb.    Dyspnea:  Variable but not doing sustained sub max ex  Cough: much better x with laughing  Sleeping: fine 30-45 degrees sleep number  SABA use: none 02: none  Nasal congestion no better with mucinex dm    No obvious day  to day or daytime variability or assoc excess/ purulent sputum or mucus plugs or hemoptysis or cp or  chest tightness, subjective wheeze (x when forgets symbicort)  or overt  b symptoms.   Sleeping as above  without nocturnal  or early am exacerbation  of respiratory  c/o's or need for noct saba. Also denies any obvious fluctuation of symptoms with weather or environmental changes or other aggravating or alleviating factors except as outlined above   No unusual exposure hx or h/o childhood pna/ asthma or knowledge of premature birth.  Current Allergies, Complete Past Medical History, Past Surgical History, Family History, and Social History were reviewed in [employer] record.  ROS  The following are not active complaints unless bolded Hoarseness, sore throat, dysphagia, dental problems, itching, sneezing,  nasal congestion or discharge of excess mucus or purulent secretions, ear ache,   fever, chills, sweats, unintended wt loss or wt gain, classically pleuritic or exertional cp,  orthopnea pnd or arm/hand swelling  or leg swelling, presyncope, palpitations, abdominal pain, anorexia, nausea, vomiting, diarrhea  or change in bowel habits or change in bladder habits, change in stools or change in urine, dysuria, hematuria,  rash, arthralgias, visual complaints, headache, numbness, weakness or ataxia or problems with walking or coordination,  change in mood or  memory.        Current Meds  Medication Sig  . albuterol (PROVENTIL) (2.5 MG/3ML) 0.083% nebulizer solution Take 3 mLs (2.5 mg total) by nebulization every 6 (six) hours as needed for wheezing or shortness of breath.  Marland Kitchen albuterol (VENTOLIN HFA) 108 (90 Base) MCG/ACT inhaler Inhale 2 puffs into the lungs every 6 (six) hours as needed for wheezing.  . budesonide-formoterol (SYMBICORT) 160-4.5 MCG/ACT inhaler Inhale 2 puffs into the lungs 2 (two) times daily.  . irbesartan (AVAPRO) 300 MG tablet Take 1 tablet (300 mg total) by mouth daily.  . Multiple Vitamin (MULTIVITAMIN) tablet Take 1 tablet by mouth daily.  . nicotine  (NICODERM CQ - DOSED IN MG/24 HOURS) 21 mg/24hr patch Place 1 patch (21 mg total) onto the skin daily.  . pantoprazole (PROTONIX) 40 MG tablet Take 1 tablet (40 mg total) by mouth daily.  . tadalafil (CIALIS) 20 MG tablet Take 1 tablet (20 mg total) by mouth daily. as directed  . valACYclovir (VALTREX) 1000 MG tablet Take 1,000 mg by mouth as needed.                       Objective:   Physical Exam     12/13/2019       232  11/01/2019       226  01/09/2019       209   12/15/2018      221   04/30/16 220 lb (99.791 kg)  04/22/16 217 lb (98.431 kg)  12/09/15 211 lb (95.709 kg)     pleasant amb wm nad  Vital signs reviewed  12/13/2019  - Note at rest 02 sats  96% on RA    HEENT : pt wearing mask not removed for exam due to covid -19 concerns.    NECK :  without JVD/Nodes/TM/ nl carotid upstrokes bilaterally   LUNGS: no acc muscle use,  Nl contour chest which is clear to A and P bilaterally without cough on insp or exp maneuvers   CV:  RRR  no s3 or murmur or increase in P2, and no edema   ABD:  soft and nontender with nl inspiratory  excursion in the supine position. No bruits or organomegaly appreciated, bowel sounds nl  MS:  Nl gait/ ext warm without deformities, calf tenderness, cyanosis or clubbing No obvious joint restrictions   SKIN: warm and dry without lesions    NEURO:  alert, approp, nl sensorium with  no motor or cerebellar deficits apparent.           Assessment:

## 2019-12-13 NOTE — Patient Instructions (Signed)
Continue symbicort 160 Take 2 puffs first thing in am and then another 2 puffs about 12 hours later.     Work on inhaler technique:  relax and gently blow all the way out then take a nice smooth deep breath back in, triggering the inhaler at same time you start breathing in.  Hold for up to 5 seconds if you can. Blow out thru nose. Rinse and gargle with water when done  For cough > mucinex dm 1200 mg twice daily  For associated nasal congestion mucinex -D (sign for it)    To get the most out of exercise, you need to be continuously aware that you are short of breath, but never out of breath, for 30 minutes daily. As you improve, it will actually be easier for you to do the same amount of exercise  in  30 minutes so always push to the level where you are short of breath.      Change in your appt to 3 months

## 2019-12-14 ENCOUNTER — Encounter: Payer: Self-pay | Admitting: Internal Medicine

## 2019-12-14 NOTE — Assessment & Plan Note (Signed)
Onset ?  2017 assoc with chest tightness Spirometry 04/22/16 no sign airflow obst, very minimal  bowing in f/v loop  - rec max gerd rx 04/30/2016  ? Response - Spirometry  02/22/18   FEV1 3.0 (73%)  Ratio 79 s curvature  > started breo which helped chest tightness did not worsen cough - 01/09/2019 ov: cough improved on gerd rx  - PFT's  01/09/2019  FEV1 2.93  (72% ) ratio 0.75  p no % improvement from saba p nothing  prior to study with DLCO  90 % corrects to 107  % for alv volume  And  Very minimal  curvature  10/18/2019 started symb 160 2bid - 12/13/2019  After extensive coaching inhaler device,  effectiveness =    75%  > continue symb 160 2bid as reports wheezing when not on it   More convinced now than ever that this is asthma but as long as maint on symb 160 >>> All goals of chronic asthma control met including optimal function and elimination of symptoms with minimal need for rescue therapy.  Contingencies discussed in full including contacting this office immediately if not controlling the symptoms using the rule of two's.     Pt informed of the seriousness of COVID 19 infection as a direct risk to lung health  and safey and to close contacts and should continue to wear a facemask in public and minimize exposure to public locations but especially avoid any area or activity where non-close contacts are not observing distancing or wearing an appropriate face mask.  I strongly recommended vaccine when offered.    >>> f/u in 3 m

## 2019-12-14 NOTE — Assessment & Plan Note (Addendum)
Onset ? 2017 assoc with chest tightness > see asthma a/p - rec trial of mucinex d 12/13/2019   Already on gerd rx but persistent nasal congestion /cough with laughter  1) maint off  cigs 2) blow symb out thr nose 3) mucinex d prn  4) consider allergy eval next since no longer smoking          Each maintenance medication was reviewed in detail including emphasizing most importantly the difference between maintenance and prns and under what circumstances the prns are to be triggered using an action plan format where appropriate.  Total time for H and P, chart review, counseling, teaching device and generating customized AVS unique to this office visit / charting = 30 min

## 2020-01-10 ENCOUNTER — Encounter: Payer: Self-pay | Admitting: Medical

## 2020-01-10 ENCOUNTER — Other Ambulatory Visit: Payer: Self-pay

## 2020-01-10 ENCOUNTER — Ambulatory Visit: Payer: BC Managed Care – PPO | Admitting: Medical

## 2020-01-10 VITALS — BP 122/86 | HR 78 | Temp 97.6°F | Ht 71.0 in | Wt 229.8 lb

## 2020-01-10 DIAGNOSIS — Z125 Encounter for screening for malignant neoplasm of prostate: Secondary | ICD-10-CM | POA: Diagnosis not present

## 2020-01-10 DIAGNOSIS — R079 Chest pain, unspecified: Secondary | ICD-10-CM

## 2020-01-10 DIAGNOSIS — Z7185 Encounter for immunization safety counseling: Secondary | ICD-10-CM

## 2020-01-10 DIAGNOSIS — I712 Thoracic aortic aneurysm, without rupture, unspecified: Secondary | ICD-10-CM

## 2020-01-10 DIAGNOSIS — Z87891 Personal history of nicotine dependence: Secondary | ICD-10-CM

## 2020-01-10 DIAGNOSIS — Z0000 Encounter for general adult medical examination without abnormal findings: Secondary | ICD-10-CM

## 2020-01-10 DIAGNOSIS — R7309 Other abnormal glucose: Secondary | ICD-10-CM | POA: Insufficient documentation

## 2020-01-10 DIAGNOSIS — Z131 Encounter for screening for diabetes mellitus: Secondary | ICD-10-CM

## 2020-01-10 DIAGNOSIS — Z7189 Other specified counseling: Secondary | ICD-10-CM

## 2020-01-10 DIAGNOSIS — R5383 Other fatigue: Secondary | ICD-10-CM | POA: Diagnosis not present

## 2020-01-10 DIAGNOSIS — K219 Gastro-esophageal reflux disease without esophagitis: Secondary | ICD-10-CM

## 2020-01-10 DIAGNOSIS — N529 Male erectile dysfunction, unspecified: Secondary | ICD-10-CM

## 2020-01-10 DIAGNOSIS — E669 Obesity, unspecified: Secondary | ICD-10-CM

## 2020-01-10 DIAGNOSIS — I10 Essential (primary) hypertension: Secondary | ICD-10-CM

## 2020-01-10 DIAGNOSIS — R059 Cough, unspecified: Secondary | ICD-10-CM

## 2020-01-10 DIAGNOSIS — Z6832 Body mass index (BMI) 32.0-32.9, adult: Secondary | ICD-10-CM

## 2020-01-10 DIAGNOSIS — G8929 Other chronic pain: Secondary | ICD-10-CM

## 2020-01-10 DIAGNOSIS — Z87438 Personal history of other diseases of male genital organs: Secondary | ICD-10-CM

## 2020-01-10 DIAGNOSIS — R058 Other specified cough: Secondary | ICD-10-CM

## 2020-01-10 DIAGNOSIS — R05 Cough: Secondary | ICD-10-CM

## 2020-01-10 DIAGNOSIS — K635 Polyp of colon: Secondary | ICD-10-CM

## 2020-01-10 DIAGNOSIS — F419 Anxiety disorder, unspecified: Secondary | ICD-10-CM

## 2020-01-10 LAB — POCT URINALYSIS DIP (PROADVANTAGE DEVICE)
Bilirubin, UA: NEGATIVE
Blood, UA: NEGATIVE
Glucose, UA: NEGATIVE mg/dL
Ketones, POC UA: NEGATIVE mg/dL
Leukocytes, UA: NEGATIVE
Nitrite, UA: NEGATIVE
Protein Ur, POC: NEGATIVE mg/dL
Specific Gravity, Urine: 1.01
Urobilinogen, Ur: NEGATIVE
pH, UA: 6 (ref 5.0–8.0)

## 2020-01-10 NOTE — Patient Instructions (Signed)
Preventative Care for Adults - Male    Thank you for coming in for your well visit today, and thank you for trusting [place] with your care!   Maintain regular health and wellness exams:  A routine yearly physical is a good way to check in with your primary care provider about your health and preventive screening. It is also an opportunity to share updates about your health and any concerns you have, and receive a thorough all-over exam.   Most health insurance companies pay for at least some preventative services.  Check with your health plan for specific coverages.  What preventative services do men need?  Adult men should have their weight and blood pressure checked regularly.   Men age 38 and older should have their cholesterol levels checked regularly.  Beginning at age 21 and continuing to age 32, men should be screened for colorectal cancer.  Certain people may need continued testing until age 63.  Updating vaccinations is part of preventative care.  Vaccinations help protect against diseases such as the flu.  Osteoporosis is a disease in which the bones lose minerals and strength as we age. Men ages 75 and over should discuss this with their caregivers  Lab tests are generally done as part of preventative care to screen for anemia and blood disorders, to screen for problems with the kidneys and liver, to screen for bladder problems, to check blood sugar, and to check your cholesterol level.  Preventative services generally include counseling about diet, exercise, avoiding tobacco, drugs, excessive alcohol consumption, and sexually transmitted infections.   Xrays and CT scans are not normally done as a preventative test, and most insurances do not pay for imaging for screening other than as discussed under cancer screens below.   On the other hand, if you have certain medical concerns, imaging may be necessary as a diagnostic test.    Your Medical Team Your medical team starts with  [place], your PCP or primary care provider.  Please use our services for your routine care such as physicals, screenings, immunizations, sick visits, and your first stop for general medical concerns.  You can call our number for after hours information for urgent questions that may need attention but cannot wait til the next business day.    Urgent care-urgent cares exist to provide care when your primary care office would typically be closed such as evenings or weekends.   Urgent care is for evaluation of urgent medical problems that do not necessarily require emergency department care, but cannot wait til the next business day when we are open.  Emergency department care-please reserve emergency department care for serious, urgent, possibly life-threatening medical problems.  This includes issues like possible stroke, heart attack, significant injury, mental health crisis, or other urgent need that requires immediate medical attention.     See your dentist office twice yearly for hygiene and cleaning visits.   Brush your teeth and floss your teeth daily.  See your eye doctor yearly for routine eye exam and screenings for glaucoma and retinal disease.   Specific Concerns: Staying abstinent from tobacco Fatigue  Stress Weight gain  Pending labs, we will give other recommendations   Vaccines:  Stay up to date with your tetanus shots and other required immunizations. You should have a booster for tetanus every 10 years. Be sure to get your flu shot every year, since 5%-20% of the [place] population comes down with the flu. The flu vaccine changes each year, so being vaccinated  once is not enough. Get your shot in the fall, before the flu season peaks.   Other vaccines to consider:  Pneumococcal vaccine to protect against certain types of pneumonia.  This is normally recommended for adults age 43 or older.  However, adults younger than 52 years old with certain underlying conditions such as diabetes,  heart or lung disease should also receive the vaccine.  Shingles vaccine to protect against Varicella Zoster if you are older than age 55, or younger than 52 years old with certain underlying illness.  If you have not had the Shingrix vaccine, please call your insurer to inquire about coverage for the Shingrix vaccine given in 2 doses.   Some insurers cover this vaccine after age 104, some cover this after age 71.  If your insurer covers this, then call to schedule appointment to have this vaccine here  Hepatitis A vaccine to protect against a form of infection of the liver by a virus acquired from food.  Hepatitis B vaccine to protect against a form of infection of the liver by a virus acquired from blood or body fluids, particularly if you work in health care.  If you plan to travel internationally, check with your local health department for specific vaccination recommendations.  Human Papilloma Virus or HPV causes cancer of the cervix, and other infections that can be transmitted from person to person. There is a vaccine for HPV, and males should get immunized between the ages of 46 and 75. It requires a series of 3 shots.   Covid/Coronavirus - as the vaccines are becoming available, please consider vaccination if you are a health care worker, first responder, or have significant health problems such as asthma, COPD, heart disease, hypertension, diabetes, obesity, multiple medical problems, over age 54yo, or immunocompromised.    You are up to date on Tetanus, pnuemococcal 23 and influenza vaccine.  I recommend the following vaccines: Shingrix (shingles) and Prevnar 13 - call insurance about these    What should I know about Cancer screening? Many types of cancers can be detected early and may often be prevented. Lung Cancer  You should be screened every year for lung cancer if: ? You are a current smoker who has smoked for at least 30 years. ? You are a former smoker who has quit  within the past 15 years.  Talk to your health care provider about your screening options, when you should start screening, and how often you should be screened.  Colorectal Cancer  Routine colorectal cancer screening usually begins at 52 years of age and should be repeated every 5-10 years until you are 52 years old. You may need to be screened more often if early forms of precancerous polyps or small growths are found. Your health care provider may recommend screening at an earlier age if you have risk factors for colon cancer.  Your health care provider may recommend using home test kits to check for hidden blood in the stool.  A small camera at the end of a tube can be used to examine your colon (sigmoidoscopy or colonoscopy). This checks for the earliest forms of colorectal cancer.  Prostate and Testicular Cancer  Depending on your age and overall health, your health care provider may do certain tests to screen for prostate and testicular cancer.  Talk to your health care provider about any symptoms or concerns you have about testicular or prostate cancer.  Skin Cancer  Check your skin from head to toe regularly.  Tell your health care provider about any new moles or changes in moles, especially if: ? There is a change in a mole's size, shape, or color. ? You have a mole that is larger than a pencil eraser.  Always use sunscreen. Apply sunscreen liberally and repeat throughout the day.  Protect yourself by wearing long sleeves, pants, a wide-brimmed hat, and sunglasses when outside.   Are you up to date on cancer screenings:  COLON CANCER SCREENING:  Up to date, likely due in 4 years with Dr. Loletha Carrow  PROSTATE CANCER SCREENING:  today   [place]:  Healthy diet:  Eat a variety of foods, including fruit, vegetables, animal or vegetable protein, such as meat, fish, chicken, and eggs, or beans, lentils, tofu, and grains, such as rice.  Drink  plenty of water daily.  Decrease saturated fat in the diet, avoid lots of red meat, processed foods, sweets, fast foods, and fried foods.  Exercise:  Aerobic exercise helps maintain good heart health. At least 30-40 minutes of moderate-intensity exercise is recommended. For example, a brisk walk that increases your heart rate and breathing. This should be done on most days of the week.   Find a type of exercise or a variety of exercises that you enjoy so that it becomes a part of your daily life.  Examples are running, walking, swimming, water aerobics, and biking.  For motivation and support, explore group exercise such as aerobic class, spin class, Zumba, Yoga,or  martial arts, etc.    Set exercise goals for yourself, such as a certain weight goal, walk or run in a race such as a 5k walk/run.  Speak to your primary care provider about exercise goals.  Your weight readings per our records: Wt Readings from Last 3 Encounters:  01/10/20 229 lb 12.8 oz (104.2 kg)  12/13/19 232 lb (105.2 kg)  11/02/19 225 lb 9.6 oz (102.3 kg)    Body mass index is 32.05 kg/m.    Disease prevention:  If you smoke or chew tobacco, find out from your caregiver how to quit. It can literally save your life, no matter how long you have been a tobacco user. If you do not use tobacco, never begin.   Maintain a healthy diet and normal weight. Increased weight leads to problems with blood pressure and diabetes.   The Body Mass Index or BMI is a way of measuring how much of your body is fat. Having a BMI above 27 increases the risk of heart disease, diabetes, hypertension, stroke and other problems related to obesity. Your caregiver can help determine your BMI and based on it develop an exercise and dietary program to help you achieve or maintain this important measurement at a healthful level.  High blood pressure causes heart and blood vessel problems.  Persistent high blood pressure should be treated with  medicine if weight loss and exercise do not work.  Your blood pressure readings per our records:     BP Readings from Last 3 Encounters:  01/10/20 122/86  12/13/19 104/72  11/02/19 118/85     Fat and cholesterol leaves deposits in your arteries that can block them. This causes heart disease and vessel disease elsewhere in your body.  If your cholesterol is found to be high, or if you have heart disease or certain other medical conditions, then you may need to have your cholesterol monitored frequently and be treated with medication.   Ask if you should have a cardiac stress  test if your history suggests this. A stress test is a test done on a treadmill that looks for heart disease. This test can find disease prior to there being a problem.    Heart disease screening:  Cardiology eval with Dr. Einar Gip 2020   Osteoporosis is a disease in which the bones lose minerals and strength as we age. This can result in serious bone fractures. Risk of osteoporosis can be identified using a bone density scan. Men ages 38 and over should discuss this with their caregivers. Ask your caregiver whether you should be taking a calcium supplement and Vitamin D, to reduce the rate of osteoporosis.   Avoid drinking alcohol in excess (more than two drinks per day).  Avoid use of street drugs. Do not share needles with anyone. Ask for professional help if you need assistance or instructions on stopping the use of alcohol, cigarettes, and/or drugs.  Brush your teeth twice a day with fluoride toothpaste, and floss once a day. Good oral hygiene prevents tooth decay and gum disease. The problems can be painful, unattractive, and can cause other health problems. Visit your dentist for a routine oral and dental check up and preventive care every 6-12 months.   Safety:  Use seatbelts 100% of the time, whether driving or as a passenger.  Use safety devices such as hearing protection if you work in environments with loud noise  or significant background noise.  Use safety glasses when doing any work that could send debris in to the eyes.  Use a helmet if you ride a bike or motorcycle.  Use appropriate safety gear for contact sports.  Talk to your caregiver about gun safety.  Use sunscreen with a SPF (or skin protection factor) of 15 or greater.  Lighter skinned people are at a greater risk of skin cancer. Don't forget to also wear sunglasses in order to protect your eyes from too much damaging sunlight. Damaging sunlight can accelerate cataract formation.   Keep carbon monoxide and smoke detectors in your home functioning at all times. Change the batteries every 6 months or use a model that plugs into the wall.    Sexual activity: . Sex is a normal part of life and sexual activity can continue into older adulthood for many healthy people.   . If you are having erectile dysfunction issues, please follow up to discuss this further.   . If you are not in a monogamous relationship or have more than one partner, please practice safe sex.  Use condoms. Condoms are used for birth control and to help reduce the spread of sexually transmitted infections (or STIs).  Some of the STIs are gonorrhea (the clap), chlamydia, syphilis, trichomonas, herpes, HPV (human papilloma virus) and HIV (human immunodeficiency virus) which causes AIDS. The herpes, HIV and HPV are viral illnesses that have no cure. These can result in disability, cancer and death.   We are able to test for STIs here at our office.

## 2020-01-10 NOTE — Progress Notes (Signed)
Subjective:   HPI  Cody Tapia is a 54 y.o. male who presents for Chief Complaint  Patient presents with  . Annual Exam    with fasting labs    Medical team: Dr. Adrian Prows, cardiology Dr. Christinia Gully, pulmonology Dr. Wilfrid Lund, gastroenterology Dr. Ila Mcgill, podiatry [facility] Dermatology Keionte Swicegood, Camelia Eng, PA-C here for primary care  He has had eventful few months, hospitalization, ongoing cough. Ended up seeing pulmonology and cardiology.   At his last pulmonology visit, he was being more careful to use Protonix 30 minutes prior to breakfast, and using Prilosec at night.  Using Symbicort preventative inhaler.     He wants Covid antibody test today given all the recent illness.   He quit tobacco last year but having strong cravings.   Wants to stay on higher dose nicotine patch and other medication support if possible.   Has gained weight with recent steroid use, illness, not as active during that time.  He does still use trainer and exercises regularly in general.   Stressed out all the time, works 12-14 hour days, always stressed an anxious which carries over into fatigue and no lipid.   Reviewed their medical, surgical, family, social, medication, and allergy history and updated chart as appropriate.  Past Medical History:  Diagnosis Date  . 23-polyvalent pneumococcal polysaccharide vaccine declined 7/14  . Anxiety    prior therapy with counselor, high stress job, 12+ hour work days  . Asthma   . ED (erectile dysfunction)   . Former smoker 2021  . GERD (gastroesophageal reflux disease)    pulmonology consult 2020  . Hypercholesteremia   . Hyperglycemia   . Hypertension   . Obesity   . Thoracic ascending aortic aneurysm ([place])   . Wears glasses     Past Surgical History:  Procedure Laterality Date  . COLONOSCOPY  2009   [facility] for abdominal pain  . COLONOSCOPY  12/2018   polyps TA, diverticulosis, Dr. Wilfrid Lund  .  ESOPHAGOGASTRODUODENOSCOPY  2009   [facility], abdominal pain  . EYE SURGERY     strabismus surgery, right  . WISDOM TOOTH EXTRACTION      Social History   Socioeconomic History  . Marital status: Single    Spouse name: Not on file  . Number of children: 1  . Years of education: Not on file  . Highest education level: Not on file  Occupational History  . Not on file  Tobacco Use  . Smoking status: Former Smoker    Packs/day: 0.25    Years: 30.00    Pack years: 7.50    Types: Cigarettes    Quit date: 10/14/2019    Years since quitting: 0.2  . Smokeless tobacco: Never Used  Substance and Sexual Activity  . Alcohol use: Yes    Alcohol/week: 3.0 standard drinks    Types: 3 Glasses of wine per week  . Drug use: No  . Sexual activity: Not on file  Other Topics Concern  . Not on file  Social History Narrative   Girlfriend Eustaquio Maize, domestic partner, 1 daughter.   Exercise - cardio, elliptical, power walking, free weights 3 days per week.  Working out with Physiological scientist.  Mortgage lending x 22 years.     Social Determinants of Health   Financial Resource Strain:   . Difficulty of Paying Living Expenses: Not on file  Food Insecurity:   . Worried About Charity fundraiser in the Last Year: Not  on file  . Ran Out of Food in the Last Year: Not on file  Transportation Needs:   . Lack of Transportation (Medical): Not on file  . Lack of Transportation (Non-Medical): Not on file  Physical Activity:   . Days of Exercise per Week: Not on file  . Minutes of Exercise per Session: Not on file  Stress:   . Feeling of Stress : Not on file  Social Connections:   . Frequency of Communication with Friends and Family: Not on file  . Frequency of Social Gatherings with Friends and Family: Not on file  . Attends Religious Services: Not on file  . Active Member of Clubs or Organizations: Not on file  . Attends Archivist Meetings: Not on file  . Marital Status: Not on file   Intimate Partner Violence:   . Fear of Current or Ex-Partner: Not on file  . Emotionally Abused: Not on file  . Physically Abused: Not on file  . Sexually Abused: Not on file    Family History  Problem Relation Age of Onset  . Heart disease Father 4       stent, CAD  . COPD Mother   . Sudden death Neg Hx   . Heart attack Neg Hx   . Hyperlipidemia Neg Hx   . Diabetes Neg Hx   . Hypertension Neg Hx   . Cancer Neg Hx   . Stroke Neg Hx      Current Outpatient Medications:  .  budesonide-formoterol (SYMBICORT) 160-4.5 MCG/ACT inhaler, Inhale 2 puffs into the lungs 2 (two) times daily., Disp: 1 Inhaler, Rfl: 11 .  irbesartan (AVAPRO) 300 MG tablet, Take 1 tablet (300 mg total) by mouth daily., Disp: 90 tablet, Rfl: 3 .  Multiple Vitamin (MULTIVITAMIN) tablet, Take 1 tablet by mouth daily., Disp: , Rfl:  .  nicotine (NICODERM CQ - DOSED IN MG/24 HOURS) 21 mg/24hr patch, Place 1 patch (21 mg total) onto the skin daily., Disp: 30 patch, Rfl: 0 .  pantoprazole (PROTONIX) 40 MG tablet, Take 1 tablet (40 mg total) by mouth daily., Disp: 30 tablet, Rfl: 11 .  tadalafil (CIALIS) 20 MG tablet, Take 1 tablet (20 mg total) by mouth daily. as directed, Disp: 8 tablet, Rfl: 5 .  albuterol (PROVENTIL) (2.5 MG/3ML) 0.083% nebulizer solution, Take 3 mLs (2.5 mg total) by nebulization every 6 (six) hours as needed for wheezing or shortness of breath. (Patient not taking: Reported on 01/10/2020), Disp: 75 mL, Rfl: 1 .  albuterol (VENTOLIN HFA) 108 (90 Base) MCG/ACT inhaler, Inhale 2 puffs into the lungs every 6 (six) hours as needed for wheezing. (Patient not taking: Reported on 01/10/2020), Disp: 18 g, Rfl: 1 .  valACYclovir (VALTREX) 1000 MG tablet, Take 1,000 mg by mouth as needed. , Disp: , Rfl:   Allergies  Allergen Reactions  . Chantix [Varenicline Tartrate]     Sleep walking  . Wellbutrin [Bupropion]     Sleepwalking, intolerance     Review of Systems Constitutional: -fever, -chills,  -sweats, -unexpected weight change, -decreased appetite, +fatigue Allergy: -sneezing, -itching, -congestion Dermatology: -changing moles, --rash, -lumps ENT: -runny nose, -ear pain, -sore throat, -hoarseness, -sinus pain, -teeth pain, - ringing in ears, -hearing loss, -nosebleeds Cardiology: -chest pain, -palpitations, -swelling, -difficulty breathing when lying flat, -waking up short of breath Respiratory: -cough, -shortness of breath, -difficulty breathing with exercise or exertion, -wheezing, -coughing up blood Gastroenterology: -abdominal pain, -nausea, -vomiting, -diarrhea, -constipation, -blood in stool, -changes in bowel movement, -difficulty swallowing  or eating Hematology: -bleeding, -bruising  Musculoskeletal: -joint aches, -muscle aches, -joint swelling, -back pain, -neck pain, -cramping, -changes in gait Ophthalmology: denies vision changes, eye redness, itching, discharge Urology: -burning with urination, -difficulty urinating, -blood in urine, -urinary frequency, -urgency, -incontinence Neurology: -headache, -weakness, -tingling, -numbness, -memory loss, -falls, -dizziness Psychology: -depressed mood, -agitation, -sleep problems Male GU: no testicular mass, pain, no lymph nodes swollen, no swelling, no rash.     Objective:  BP 122/86   Pulse 78   Temp 97.6 F (36.4 C)   Ht 5\' 11"  (1.803 m)   Wt 229 lb 12.8 oz (104.2 kg)   SpO2 96%   BMI 32.05 kg/m   General appearance: alert, no distress, WD/WN, white male  Skin: warm, no lesions Neck: supple, no lymphadenopathy, no thyromegaly, no masses, normal ROM, no bruits Chest: non tender, normal shape and expansion Heart: RRR, normal S1, S2, no murmurs Lungs: CTA bilaterally, no wheezes, rhonchi, or rales Abdomen: +bs, soft, non tender, non distended, no masses, no hepatomegaly, no splenomegaly, no bruits Back: non tender, normal ROM, no scoliosis Musculoskeletal: upper extremities non tender, no obvious deformity, normal ROM  throughout, lower extremities non tender, no obvious deformity, normal ROM throughout Extremities: no edema, no cyanosis, no clubbing Pulses: 2+ symmetric, upper and lower extremities, normal cap refill Neurological: alert, oriented x 3, CN2-12 intact, strength normal upper extremities and lower extremities, sensation normal throughout, DTRs 2+ throughout, no cerebellar signs, gait normal Psychiatric: normal affect, behavior normal, pleasant  GU: normal male external genitalia,+circ, nontender, no masses, no hernia, no lymphadenopathy Rectal: anus normal tone, prostate WNL   Assessment and Plan :   Encounter Diagnoses  Name Primary?  . Routine general medical examination at a health care facility Yes  . Class 1 obesity with serious comorbidity and body mass index (BMI) of 32.0 to 32.9 in adult, unspecified obesity type   . Thoracic aortic aneurysm without rupture ([place])   . Essential hypertension   . Gastroesophageal reflux disease, unspecified whether esophagitis present   . Polyp of colon, unspecified part of colon, unspecified type   . Former smoker   . Anxiety   . Cough   . Chronic chest pain   . Vaccine counseling   . History of prostatitis   . Erectile dysfunction, unspecified erectile dysfunction type   . Motion sickness, initial encounter   . Upper airway cough syndrome   . Screening for prostate cancer   . Fatigue, unspecified type   . Elevated glucose   . Screening for diabetes mellitus     Physical exam - discussed and counseled on healthy lifestyle, diet, exercise, preventative care, vaccinations, sick and well care, proper use of emergency dept and after hours care, and addressed their concerns.    Health screening: See your eye doctor yearly for routine vision care. See your dentist yearly for routine dental care including hygiene visits twice yearly.  Cancer screening Colonoscopy: Reviewed colonoscopy on file that is up to date  Discussed PSA, prostate exam,  and prostate cancer screening risks/benefits.     Skin surveillance - sees dermatology   Vaccinations: Advised yearly influenza vaccine Up to date on tetanus Up to date on Pneumococcal 23  Advised he check insurance and consider Shingrix, Prevnar 13    Separate significant chronic issues discussed: HTN, thoracic aortic aneurysm, chest pain  -  I reviewed 11/02/2019 cardiology note from Dr. Einar Gip. As of this time thoracic aortic aneurysm is stable.  Most recent echocardiogram was stable with  LVEF 55-60%, 09/2019. Low risk stress test 12/2018. CT scan 2020 showed aneurysm.  Follow up due in March 2021.  Asthma, GERD - I reviewed 12/13/2019 pulmonology notes with Dr. Melvyn Novas.  He was advised to c/t Symbicort, PPI, due follow up in April 2021.  Former smoker with strong urges to start back  - advised counseling, continue nicotine patch at higher dose.     Weight gain, obesity - he had a rough few months with illness, cough, hospitalization, and being on steroids.   He wants other assistance such as medication or other strategy to lose weight  Anxiety, work stress - work on stress reduction where possibly, continue regular exercise with trainer  ED - does fine with Cialis  Fatigue - multifactorial.  F/u pending labs  Elevated glucose on recent labs - screening for diabetes  Former smoker - c/t efforts to remain abstinent.  C/t nicotine patch for now.  Consider other medication support at his request.      Daire was seen today for annual exam.  Diagnoses and all orders for this visit:  Routine general medical examination at a health care facility -     CBC with Differential/Platelet -     Hemoglobin A1c -     Lipid panel -     PSA -     Testosterone -     SAR CoV2 Serology (COVID 19)AB(IGG)IA -     TSH  Class 1 obesity with serious comorbidity and body mass index (BMI) of 32.0 to 32.9 in adult, unspecified obesity type  Thoracic aortic aneurysm without rupture ([place])  Essential  hypertension  Gastroesophageal reflux disease, unspecified whether esophagitis present  Polyp of colon, unspecified part of colon, unspecified type  Former smoker  Anxiety  Cough  Chronic chest pain  Vaccine counseling  History of prostatitis  Erectile dysfunction, unspecified erectile dysfunction type  Motion sickness, initial encounter  Upper airway cough syndrome  Screening for prostate cancer -     PSA  Fatigue, unspecified type -     CBC with Differential/Platelet -     Testosterone -     TSH  Elevated glucose -     Hemoglobin A1c  Screening for diabetes mellitus    Follow-up pending labs, yearly for physical

## 2020-01-11 ENCOUNTER — Other Ambulatory Visit: Payer: Self-pay | Admitting: Medical

## 2020-01-11 LAB — CBC WITH DIFFERENTIAL/PLATELET
Basophils Absolute: 0 10*3/uL (ref 0.0–0.2)
Basos: 0 %
EOS (ABSOLUTE): 0.2 10*3/uL (ref 0.0–0.4)
Eos: 2 %
Hematocrit: 46.5 % (ref 37.5–51.0)
Hemoglobin: 15.9 g/dL (ref 13.0–17.7)
Immature Grans (Abs): 0.1 10*3/uL (ref 0.0–0.1)
Immature Granulocytes: 1 %
Lymphocytes Absolute: 2 10*3/uL (ref 0.7–3.1)
Lymphs: 29 %
MCH: 30.8 pg (ref 26.6–33.0)
MCHC: 34.2 g/dL (ref 31.5–35.7)
MCV: 90 fL (ref 79–97)
Monocytes Absolute: 0.8 10*3/uL (ref 0.1–0.9)
Monocytes: 12 %
Neutrophils Absolute: 3.8 10*3/uL (ref 1.4–7.0)
Neutrophils: 56 %
Platelets: 318 10*3/uL (ref 150–450)
RBC: 5.16 x10E6/uL (ref 4.14–5.80)
RDW: 12.8 % (ref 11.6–15.4)
WBC: 6.9 10*3/uL (ref 3.4–10.8)

## 2020-01-11 LAB — LIPID PANEL
Chol/HDL Ratio: 4.4 ratio (ref 0.0–5.0)
Cholesterol, Total: 223 mg/dL — ABNORMAL HIGH (ref 100–199)
HDL: 51 mg/dL (ref >39)
LDL Chol Calc (NIH): 157 mg/dL — ABNORMAL HIGH (ref 0–99)
Triglycerides: 86 mg/dL (ref 0–149)
VLDL Cholesterol Cal: 15 mg/dL (ref 5–40)

## 2020-01-11 LAB — PSA: Prostate Specific Ag, Serum: 0.3 ng/mL (ref 0.0–4.0)

## 2020-01-11 LAB — HEMOGLOBIN A1C
Est. average glucose Bld gHb Est-mCnc: 114 mg/dL
Hgb A1c MFr Bld: 5.6 % (ref 4.8–5.6)

## 2020-01-11 LAB — TESTOSTERONE: Testosterone: 315 ng/dL (ref 264–916)

## 2020-01-11 LAB — SAR COV2 SEROLOGY (COVID19)AB(IGG),IA: DiaSorin SARS-CoV-2 Ab, IgG: NEGATIVE

## 2020-01-11 LAB — TSH: TSH: 1.71 u[IU]/mL (ref 0.450–4.500)

## 2020-01-11 MED ORDER — TADALAFIL 20 MG PO TABS: 20.0000 mg | ORAL_TABLET | Freq: Every day | ORAL | 5 refills | Status: DC

## 2020-01-11 MED ORDER — NICOTINE 21 MG/24HR TD PT24: 21.0000 mg | MEDICATED_PATCH | Freq: Every day | TRANSDERMAL | 0 refills | Status: DC

## 2020-01-11 MED ORDER — QSYMIA 7.5-46 MG PO CP24: 1.0000 | ORAL_CAPSULE | ORAL | 1 refills | Status: DC

## 2020-01-12 ENCOUNTER — Other Ambulatory Visit: Payer: Self-pay | Admitting: Medical

## 2020-01-12 DIAGNOSIS — R6882 Decreased libido: Secondary | ICD-10-CM

## 2020-01-12 DIAGNOSIS — R7989 Other specified abnormal findings of blood chemistry: Secondary | ICD-10-CM

## 2020-01-12 DIAGNOSIS — IMO0002 Reserved for concepts with insufficient information to code with codable children: Secondary | ICD-10-CM

## 2020-01-12 DIAGNOSIS — E348 Other specified endocrine disorders: Secondary | ICD-10-CM

## 2020-01-12 MED ORDER — ROSUVASTATIN CALCIUM 5 MG PO TABS: 5.0000 mg | ORAL_TABLET | Freq: Every day | ORAL | 3 refills | Status: DC

## 2020-01-17 ENCOUNTER — Other Ambulatory Visit: Payer: BC Managed Care – PPO

## 2020-01-17 ENCOUNTER — Other Ambulatory Visit: Payer: Self-pay

## 2020-01-17 DIAGNOSIS — R7989 Other specified abnormal findings of blood chemistry: Secondary | ICD-10-CM | POA: Diagnosis not present

## 2020-01-17 DIAGNOSIS — E348 Other specified endocrine disorders: Secondary | ICD-10-CM

## 2020-01-17 DIAGNOSIS — R6882 Decreased libido: Secondary | ICD-10-CM | POA: Diagnosis not present

## 2020-01-17 DIAGNOSIS — IMO0002 Reserved for concepts with insufficient information to code with codable children: Secondary | ICD-10-CM

## 2020-01-18 DIAGNOSIS — C4441 Basal cell carcinoma of skin of scalp and neck: Secondary | ICD-10-CM | POA: Diagnosis not present

## 2020-01-18 DIAGNOSIS — L821 Other seborrheic keratosis: Secondary | ICD-10-CM | POA: Diagnosis not present

## 2020-01-18 DIAGNOSIS — D2330 Other benign neoplasm of skin of unspecified part of face: Secondary | ICD-10-CM | POA: Diagnosis not present

## 2020-01-18 DIAGNOSIS — D1801 Hemangioma of skin and subcutaneous tissue: Secondary | ICD-10-CM | POA: Diagnosis not present

## 2020-01-18 LAB — PROLACTIN: Prolactin: 8.6 ng/mL (ref 4.0–15.2)

## 2020-01-18 LAB — TESTOSTERONE,FREE AND TOTAL
Testosterone, Free: 5.1 pg/mL — ABNORMAL LOW (ref 7.2–24.0)
Testosterone: 248 ng/dL — ABNORMAL LOW (ref 264–916)

## 2020-01-18 LAB — FSH/LH
FSH: 4.2 m[IU]/mL (ref 1.5–12.4)
LH: 4.3 m[IU]/mL (ref 1.7–8.6)

## 2020-01-23 DIAGNOSIS — Z20822 Contact with and (suspected) exposure to covid-19: Secondary | ICD-10-CM | POA: Diagnosis not present

## 2020-01-25 ENCOUNTER — Ambulatory Visit: Payer: BLUE CROSS/BLUE SHIELD | Admitting: Cardiology

## 2020-01-29 ENCOUNTER — Telehealth: Payer: Self-pay | Admitting: Medical

## 2020-01-29 DIAGNOSIS — A630 Anogenital (venereal) warts: Secondary | ICD-10-CM | POA: Diagnosis not present

## 2020-01-29 NOTE — Telephone Encounter (Signed)
Pt called and said he would like to try the Testosterone gel. Pt uses the [facility] at [address]

## 2020-01-30 ENCOUNTER — Ambulatory Visit: Payer: BC Managed Care – PPO | Admitting: Internal Medicine

## 2020-01-30 ENCOUNTER — Other Ambulatory Visit: Payer: Self-pay | Admitting: Medical

## 2020-01-30 MED ORDER — TESTOSTERONE 20.25 MG/1.25GM (1.62%) TD GEL: 2.0000 | Freq: Every day | TRANSDERMAL | 1 refills | Status: DC

## 2020-01-30 NOTE — Telephone Encounter (Signed)
I send communication by email/my chart

## 2020-02-02 ENCOUNTER — Ambulatory Visit: Payer: BC Managed Care – PPO | Admitting: Cardiology

## 2020-02-05 ENCOUNTER — Telehealth: Payer: Self-pay

## 2020-02-05 ENCOUNTER — Other Ambulatory Visit: Payer: Self-pay | Admitting: Medical

## 2020-02-05 DIAGNOSIS — R7989 Other specified abnormal findings of blood chemistry: Secondary | ICD-10-CM

## 2020-02-05 NOTE — Telephone Encounter (Signed)
350 or less is considered low.  He has had 2 levels below this, and I believe the last was a morning visit.   Will this suffice? Audelia Acton

## 2020-02-05 NOTE — Telephone Encounter (Signed)
For the level to be considered low it has to be below 264 which he had one level below that I saw so he would need another level below 264.

## 2020-02-05 NOTE — Telephone Encounter (Signed)
I had not before that the level had to be below 264 which I find surprising.  Urology normally even says below 350 is considered low  Nevertheless if insurance is going require a another morning lab level then let patient know so he can come in for this

## 2020-02-05 NOTE — Telephone Encounter (Signed)
I submitted a PA for the pts. Testosterone Gel and it was denied by [employer], they stated pt. Must have two testosterone levels below the normal range and it must be taking in the a.m. between the hours of 7 a.m. and 11 a.m. The pt. Only had one low level on file.

## 2020-02-06 NOTE — Telephone Encounter (Signed)
Pt. Has been scheduled for tomorrow morning at 8:30 a.m. to check testosterone level, if you could put the orders in for that please.

## 2020-02-07 ENCOUNTER — Other Ambulatory Visit: Payer: BC Managed Care – PPO

## 2020-02-07 ENCOUNTER — Other Ambulatory Visit: Payer: Self-pay

## 2020-02-07 DIAGNOSIS — E291 Testicular hypofunction: Secondary | ICD-10-CM | POA: Diagnosis not present

## 2020-02-07 DIAGNOSIS — R7989 Other specified abnormal findings of blood chemistry: Secondary | ICD-10-CM | POA: Diagnosis not present

## 2020-02-07 NOTE — Telephone Encounter (Signed)
Order is in.

## 2020-02-08 ENCOUNTER — Telehealth: Payer: Self-pay | Admitting: Medical

## 2020-02-08 LAB — TESTOSTERONE: Testosterone: 297 ng/dL (ref 264–916)

## 2020-02-08 NOTE — Telephone Encounter (Signed)
I can send his latest lab results to the insurance company but per his insurance company he has to have two testosterone levels below the normal range which is 264-916.

## 2020-02-08 NOTE — Telephone Encounter (Signed)
Please try and push this through with the readings we have and office notes

## 2020-02-08 NOTE — Telephone Encounter (Signed)
Cody Tapia, see his additional testosterone reading proving low Testerone so we can get the Testosterone gel approved.

## 2020-02-09 ENCOUNTER — Encounter: Payer: Self-pay | Admitting: Medical

## 2020-02-09 ENCOUNTER — Telehealth: Payer: Self-pay | Admitting: Medical

## 2020-02-09 NOTE — Telephone Encounter (Signed)
I have re submitted the PA with the new lab results and with you're note and will let you know as soon as I here back from the insurance company.

## 2020-02-09 NOTE — Telephone Encounter (Signed)
Please send my letter I typed today along with the 3 recent testosterone levels to get testosterone therapy approved.   He sent a fax number by email Anselm Jungling chart for correspondence.

## 2020-02-12 NOTE — Telephone Encounter (Signed)
Good. Please let him know and to call pharmacy to make sure he can pick it up

## 2020-02-12 NOTE — Telephone Encounter (Signed)
Pt. Aware to call the pharmacy and then pick up his Testosterone Gel.

## 2020-02-12 NOTE — Telephone Encounter (Signed)
I received notice from his insurance company and they finally did approve his testosterone gel and it was approved from 01/30/20-01/29/21.

## 2020-02-13 ENCOUNTER — Telehealth: Payer: Self-pay | Admitting: Internal Medicine

## 2020-02-13 ENCOUNTER — Other Ambulatory Visit: Payer: Self-pay | Admitting: Medical

## 2020-02-13 MED ORDER — TESTOSTERONE 50 MG/5GM (1%) TD GEL: 5.0000 g | Freq: Every day | TRANSDERMAL | 2 refills | Status: DC

## 2020-02-13 NOTE — Telephone Encounter (Signed)
Pt's insurance will only pay for testosterone gel packets and not pump. [facility] res[facility] to [facility]

## 2020-02-13 NOTE — Telephone Encounter (Signed)
Med sent.

## 2020-02-15 ENCOUNTER — Telehealth: Payer: Self-pay | Admitting: Medical

## 2020-02-15 NOTE — Telephone Encounter (Signed)
Adam, please help with this.  See his [place] email.  This is getting very frustrating for the patient and for [place]  Please call the pharmacy and straighten this mess out.  His insurance recently approved testosterone per your note to me.  However the insurance required use of testosterone packets and not the gel pump bottle.   I sent to the pharmacy just the other day Testim which comes in gel pockets  I am not sure what the pharmacy is seeing on there end.   Please just have the pharmacy knows what we need to move we are not going to continue playing his back-and-forth game.     I just need to know from the pharmacy what we can use, what they have in stock and go ahead and approve with my verbal but will suffice for the insurance

## 2020-02-15 NOTE — Telephone Encounter (Signed)
I spoke with the pharmacists at [facility] and I gave them the information about the Testosterone I had got approved by the insurance and she ran it through and she said it went through. She said she will be getting the prescription ready and will call the pt. When it is ready to pick up.

## 2020-02-16 ENCOUNTER — Other Ambulatory Visit: Payer: Self-pay | Admitting: Medical

## 2020-02-16 MED ORDER — TESTOSTERONE 50 MG/5GM (1%) TD GEL: 5.0000 g | Freq: Every day | TRANSDERMAL | 5 refills | Status: DC

## 2020-02-26 DIAGNOSIS — K6289 Other specified diseases of anus and rectum: Secondary | ICD-10-CM | POA: Diagnosis not present

## 2020-02-26 DIAGNOSIS — K602 Anal fissure, unspecified: Secondary | ICD-10-CM | POA: Diagnosis not present

## 2020-03-05 ENCOUNTER — Telehealth: Payer: Self-pay | Admitting: Medical

## 2020-03-05 NOTE — Telephone Encounter (Signed)
I am submitting the new PA for the prescription for the testosterone 50mg /5gm 1% gel and I noticed on the prescription it say amount to be dispensed is 5 gm which would only be enough for 1 day supply. It is asking me to put the amount to be dispensed on the PA I didn't know if you wanted to submit a new prescription for at least a 30 day supply which would be 150 gm. If so I can add this information to the new PA.

## 2020-03-05 NOTE — Telephone Encounter (Signed)
Cipriano Mile, please see his email.  There was an issue over authorization on the quantiy of milligram and dosing.   See if we can get this resolved.  The plan was to use 1 tube daily or 50gm daily of the 1% topical gel packet.  Not sure what the paperwork looked like on your end Adam, but call and see if we can get this resolved.      Obviously based on what the pharmacy dispensed and what he is describing is a different product that I am not specifically familiar with.  Nevertheless he was expecting a 5 g tube and somehow only got a really tiny dose tube of light 1.25 g.     See if we can get insurance to approve what is recommended by the pharmacist which is the 5 g tube daily.  Let me know what you find out.  Talk to me before you talk to patient.

## 2020-03-05 NOTE — Telephone Encounter (Signed)
It looks like the PA and appeal I had to submit to get the pts. Testosterone Gel approved was sent in under the original prescription which was for the Testosterone 20.25mg /1.25gm(1.62%) gel and that took several weeks to be approved. I will have to start over and submit a new one for the new prescription which is for the testosterone gel with a 5 gm tube, I will start on this but this m,ay take some time again and do not know if they will approve it for that amount and quantity.

## 2020-03-06 ENCOUNTER — Other Ambulatory Visit: Payer: Self-pay | Admitting: Medical

## 2020-03-06 MED ORDER — TESTOSTERONE 5.5 MG/ACT NA GEL: 5.5000 mg | Freq: Every day | NASAL | 2 refills | Status: DC

## 2020-03-12 ENCOUNTER — Ambulatory Visit: Payer: BC Managed Care – PPO | Admitting: Internal Medicine

## 2020-03-18 ENCOUNTER — Telehealth: Payer: Self-pay

## 2020-03-18 NOTE — Telephone Encounter (Signed)
I have been having a really tough time getting Cody Tapia's Testosterone 50mg /5gm 1% Gel. Last week I was on the phone for at least over 2 hours being transferred about 10-12 times and they still never got me to the right person to submit an appeal. I did finally today get a peer to peer review scheduled for you on Thursday between 11:45 and 12:15 they will try to contact you twice and if you can not be reached they will leave a message to reschedule the review. I have blocked your schedule on 03/21/20 11:45-12:15.

## 2020-03-19 DIAGNOSIS — Z03818 Encounter for observation for suspected exposure to other biological agents ruled out: Secondary | ICD-10-CM | POA: Diagnosis not present

## 2020-03-19 DIAGNOSIS — Z1152 Encounter for screening for COVID-19: Secondary | ICD-10-CM | POA: Diagnosis not present

## 2020-03-21 ENCOUNTER — Other Ambulatory Visit: Payer: Self-pay | Admitting: Medical

## 2020-03-21 DIAGNOSIS — R5383 Other fatigue: Secondary | ICD-10-CM

## 2020-03-21 DIAGNOSIS — N529 Male erectile dysfunction, unspecified: Secondary | ICD-10-CM

## 2020-03-21 DIAGNOSIS — R7989 Other specified abnormal findings of blood chemistry: Secondary | ICD-10-CM

## 2020-03-21 NOTE — Telephone Encounter (Signed)
I spoke to [employer].  We are in disagreement as to whether his lab result for testosterone is low or not.  I spoke with the other medical providers here, and we all agree less than 300 should be considered low  However the insurance company is basically using the low anxiety on the lab report which is not typically what is done for medical decision-making  provider.  So here are our options.  We can have him come back for a lab called sex hormone binding globulin which may help [place] please our case.   Option 2 would be to send him to urology to see what they can do to get coverage.  Option 3 is a just have him pay cash for compounded testosterone through [place] [facility] [place].  This would probably be around $40 to $50 /month   [facility] Also, have Verdis Frederickson call lab.  We consider low to be under 300, however, lab report shows reference range low of 264.  The insurance company is using this against us/patient to suggest his lab isn't low.  However, most providers consider anything < 300 to be low

## 2020-03-22 NOTE — Telephone Encounter (Signed)
Cody Tapia confirmed that the range 264-916 is accurate for normal testosterone. If patient number is 297 it is considered normal range.

## 2020-03-25 NOTE — Telephone Encounter (Signed)
Message sent via my chart

## 2020-03-25 NOTE — Telephone Encounter (Signed)
After arguing back and forth with insurance company, the verdict is that what insurance deems is "low testosterone" vs what primary care, urologist, or in other words "medical providers" deem low are 2 different things.   This is a change in what I've experienced in the past, so at this point your insurance is not going to pay for therapy.   Moving forward, options include: 1- be thankful your Testosterone level is at least in the high 200s which isn't terrible and doesn't always require treatment 2-if you still want to pursue trial of therapy it would be about $40-50 /month cash for compounded cream at [place] 3- or I can refer you to Urology for further eval and consideration of treatment

## 2020-04-03 ENCOUNTER — Telehealth: Payer: Self-pay | Admitting: Medical

## 2020-04-03 ENCOUNTER — Other Ambulatory Visit: Payer: Self-pay | Admitting: Medical

## 2020-04-03 MED ORDER — TESTOSTERONE 5.5 MG/ACT NA GEL: 5.0000 mg | Freq: Every day | NASAL | 2 refills | Status: DC

## 2020-04-03 NOTE — Telephone Encounter (Signed)
Call in 5mg  [facility]

## 2020-04-04 NOTE — Telephone Encounter (Signed)
[facility] stated they spoke with provider this morning and testosterone has been taken care of

## 2020-04-09 DIAGNOSIS — M9903 Segmental and somatic dysfunction of lumbar region: Secondary | ICD-10-CM | POA: Diagnosis not present

## 2020-04-09 DIAGNOSIS — M545 Low back pain: Secondary | ICD-10-CM | POA: Diagnosis not present

## 2020-04-09 DIAGNOSIS — M9905 Segmental and somatic dysfunction of pelvic region: Secondary | ICD-10-CM | POA: Diagnosis not present

## 2020-04-09 DIAGNOSIS — M9904 Segmental and somatic dysfunction of sacral region: Secondary | ICD-10-CM | POA: Diagnosis not present

## 2020-04-15 ENCOUNTER — Ambulatory Visit: Payer: BC Managed Care – PPO | Admitting: Internal Medicine

## 2020-04-16 ENCOUNTER — Other Ambulatory Visit: Payer: Self-pay | Admitting: Medical

## 2020-04-16 MED ORDER — NICOTINE 10 MG IN INHA: 1.0000 | RESPIRATORY_TRACT | 0 refills | Status: DC | PRN

## 2020-04-16 MED ORDER — NICOTINE 21 MG/24HR TD PT24: 21.0000 mg | MEDICATED_PATCH | Freq: Every day | TRANSDERMAL | 0 refills | Status: DC

## 2020-05-13 DIAGNOSIS — M9904 Segmental and somatic dysfunction of sacral region: Secondary | ICD-10-CM | POA: Diagnosis not present

## 2020-05-13 DIAGNOSIS — M9903 Segmental and somatic dysfunction of lumbar region: Secondary | ICD-10-CM | POA: Diagnosis not present

## 2020-05-13 DIAGNOSIS — M9905 Segmental and somatic dysfunction of pelvic region: Secondary | ICD-10-CM | POA: Diagnosis not present

## 2020-05-13 DIAGNOSIS — M545 Low back pain: Secondary | ICD-10-CM | POA: Diagnosis not present

## 2020-05-16 ENCOUNTER — Ambulatory Visit: Payer: BC Managed Care – PPO | Admitting: Internal Medicine

## 2020-05-16 DIAGNOSIS — M9903 Segmental and somatic dysfunction of lumbar region: Secondary | ICD-10-CM | POA: Diagnosis not present

## 2020-05-16 DIAGNOSIS — M9904 Segmental and somatic dysfunction of sacral region: Secondary | ICD-10-CM | POA: Diagnosis not present

## 2020-05-16 DIAGNOSIS — M545 Low back pain: Secondary | ICD-10-CM | POA: Diagnosis not present

## 2020-05-16 DIAGNOSIS — M9905 Segmental and somatic dysfunction of pelvic region: Secondary | ICD-10-CM | POA: Diagnosis not present

## 2020-05-20 DIAGNOSIS — K602 Anal fissure, unspecified: Secondary | ICD-10-CM | POA: Diagnosis not present

## 2020-06-20 ENCOUNTER — Other Ambulatory Visit: Payer: Self-pay

## 2020-06-20 ENCOUNTER — Encounter: Payer: Self-pay | Admitting: Medical

## 2020-06-20 ENCOUNTER — Ambulatory Visit: Payer: BC Managed Care – PPO | Admitting: Medical

## 2020-06-20 VITALS — BP 102/80 | HR 91 | Ht 71.0 in | Wt 226.2 lb

## 2020-06-20 DIAGNOSIS — E785 Hyperlipidemia, unspecified: Secondary | ICD-10-CM | POA: Diagnosis not present

## 2020-06-20 DIAGNOSIS — Z6831 Body mass index (BMI) 31.0-31.9, adult: Secondary | ICD-10-CM | POA: Insufficient documentation

## 2020-06-20 DIAGNOSIS — R7989 Other specified abnormal findings of blood chemistry: Secondary | ICD-10-CM

## 2020-06-20 DIAGNOSIS — R5383 Other fatigue: Secondary | ICD-10-CM | POA: Diagnosis not present

## 2020-06-20 DIAGNOSIS — G478 Other sleep disorders: Secondary | ICD-10-CM | POA: Diagnosis not present

## 2020-06-20 NOTE — Progress Notes (Signed)
Done

## 2020-06-20 NOTE — Progress Notes (Addendum)
Subjective:  Cody Tapia is a 54 y.o. male who presents for Chief Complaint  Patient presents with  . Follow-up    wants fasting labs      Medical Team Dr. Wilfrid Lund, GI Dr. Ila Mcgill, podiatry Dr. Christinia Gully, pulmonology Dr. Karlton Lemon, sports medicine Alick Lecomte, Camelia Eng, PA-C  Concerns: Here for follow-up.  Back after his February visit we started testosterone therapy.  We could not get any of the normal medication covered by insurance so he has been using custom care compounded cream on application each shoulder daily.  He cannot tell a huge difference with this  He continues to feel fatigued all the time.  In recent months he has seen cardiology, had evaluation, saw pulmonology multiple times.  He is on medicines now for cholesterol blood pressure and asthma regularly but still feels fatigued.  We have talked about sleep apnea before but he denies snoring, he denies witnessed apnea.  He is a former smoker.  Since last visit he tried Qsymia but did not like it helped at all compared to plain phentermine in the past.  He still wants help with trying to lose weight.  He is exercising regularly.  No other aggravating or relieving factors.    No other c/o.  The following portions of the patient's history were reviewed and updated as appropriate: allergies, current medications, past family history, past medical history, past social history, past surgical history and problem list.  ROS Otherwise as in subjective above  Past Medical History:  Diagnosis Date  . 23-polyvalent pneumococcal polysaccharide vaccine declined 7/14  . Anxiety    prior therapy with counselor, high stress job, 12+ hour work days  . Asthma   . ED (erectile dysfunction)   . Former smoker 2021  . GERD (gastroesophageal reflux disease)    pulmonology consult 2020  . Hypercholesteremia   . Hyperglycemia   . Hypertension   . Obesity   . Thoracic ascending aortic aneurysm ([place])   . Wears  glasses    Current Outpatient Medications on File Prior to Visit  Medication Sig Dispense Refill  . budesonide-formoterol (SYMBICORT) 160-4.5 MCG/ACT inhaler Inhale 2 puffs into the lungs 2 (two) times daily. 1 Inhaler 11  . irbesartan (AVAPRO) 300 MG tablet Take 1 tablet (300 mg total) by mouth daily. 90 tablet 3  . Multiple Vitamin (MULTIVITAMIN) tablet Take 1 tablet by mouth daily.    . nicotine (NICODERM CQ - DOSED IN MG/24 HOURS) 21 mg/24hr patch Place 1 patch (21 mg total) onto the skin daily. 30 patch 0  . nicotine (NICOTROL) 10 MG inhaler Inhale 1 Cartridge (1 continuous puffing total) into the lungs as needed for smoking cessation. 42 each 0  . pantoprazole (PROTONIX) 40 MG tablet Take 1 tablet (40 mg total) by mouth daily. 30 tablet 11  . rosuvastatin (CRESTOR) 5 MG tablet Take 1 tablet (5 mg total) by mouth at bedtime. 90 tablet 3  . tadalafil (CIALIS) 20 MG tablet Take 1 tablet (20 mg total) by mouth daily. as directed 8 tablet 5  . Testosterone 5.5 MG/ACT GEL Apply 5 mg topically daily. 30 g 2  . albuterol (PROVENTIL) (2.5 MG/3ML) 0.083% nebulizer solution Take 3 mLs (2.5 mg total) by nebulization every 6 (six) hours as needed for wheezing or shortness of breath. (Patient not taking: Reported on 01/10/2020) 75 mL 1  . albuterol (VENTOLIN HFA) 108 (90 Base) MCG/ACT inhaler Inhale 2 puffs into the lungs every 6 (six) hours as needed  for wheezing. (Patient not taking: Reported on 01/10/2020) 18 g 1  . Phentermine-Topiramate (QSYMIA) 7.5-46 MG CP24 Take 1 capsule by mouth every morning. (Patient not taking: Reported on 06/20/2020) 30 capsule 1  . valACYclovir (VALTREX) 1000 MG tablet Take 1,000 mg by mouth as needed.  (Patient not taking: Reported on 06/20/2020)     No current facility-administered medications on file prior to visit.    Objective: BP 102/80   Pulse 91   Ht 5\' 11"  (1.803 m)   Wt (!) 226 lb 3.2 oz (102.6 kg)   SpO2 96%   BMI 31.55 kg/m   General appearance: alert, no  distress, well developed, well nourished Neck: supple, no lymphadenopathy, no thyromegaly, no masses Heart: RRR, normal S1, S2, no murmurs Lungs: CTA bilaterally, no wheezes, rhonchi, or rales Pulses: 2+ radial pulses, 2+ pedal pulses, normal cap refill Ext: no edema   Assessment: Encounter Diagnoses  Name Primary?  . Fatigue, unspecified type Yes  . Low testosterone   . Hyperlipidemia, unspecified hyperlipidemia type   . Non-restorative sleep   . BMI 31.0-31.9,adult      Plan: Fatigue-likely multifactorial.  We have discussed this on several visits now.  He has seen cardiology and pulmonology in recent months.  He begin on testosterone therapy a few months ago.  He is exercising.  Nevertheless he still feels tired all the time.  We discussed other possible differential..  We are going to refer for sleep study to rule out sleep apnea.   If sleep study turns out be normal, positionally refer to neurology, consider Nuvigil or other therapies  Low testosterone-he is 2 to 3 months out from starting compounded testosterone therapy as we had a very difficult time getting insurance to cover prescription medications that are more common.    Hyperlipidemia-he is compliant with statin at this point.  Labs today since he is started the medication  Continue efforts to lose weight through healthy diet, exercise daily.  We discussed strategies that can be helpful.  He wants to pursue Contrave medication depending on the other eval above.  He did not seem to get improvement with the regular dose of Qsymia.   I reviewed his January 2021 pulmonology visit and December 2020 cardiology visit and studies that were done   Eriberto was seen today for follow-up.  Diagnoses and all orders for this visit:  Fatigue, unspecified type -     Testosterone -     Comprehensive metabolic panel -     Home sleep test  Low testosterone -     Testosterone  Hyperlipidemia, unspecified hyperlipidemia type -      Lipid panel -     Comprehensive metabolic panel  Non-restorative sleep -     Home sleep test  BMI 31.0-31.9,adult    Follow up: Pending sleep study and labs

## 2020-06-21 ENCOUNTER — Other Ambulatory Visit: Payer: Self-pay | Admitting: Medical

## 2020-06-21 LAB — COMPREHENSIVE METABOLIC PANEL
ALT: 26 IU/L (ref 0–44)
AST: 21 IU/L (ref 0–40)
Albumin/Globulin Ratio: 1.6 (ref 1.2–2.2)
Albumin: 4.4 g/dL (ref 3.8–4.9)
Alkaline Phosphatase: 126 IU/L — ABNORMAL HIGH (ref 48–121)
BUN/Creatinine Ratio: 17 (ref 9–20)
BUN: 19 mg/dL (ref 6–24)
Bilirubin Total: 0.7 mg/dL (ref 0.0–1.2)
CO2: 20 mmol/L (ref 20–29)
Calcium: 9.4 mg/dL (ref 8.7–10.2)
Chloride: 102 mmol/L (ref 96–106)
Creatinine, Ser: 1.13 mg/dL (ref 0.76–1.27)
GFR calc Af Amer: 87 mL/min/{1.73_m2} (ref >59)
GFR calc non Af Amer: 75 mL/min/{1.73_m2} (ref >59)
Globulin, Total: 2.8 g/dL (ref 1.5–4.5)
Glucose: 99 mg/dL (ref 65–99)
Potassium: 4.6 mmol/L (ref 3.5–5.2)
Sodium: 137 mmol/L (ref 134–144)
Total Protein: 7.2 g/dL (ref 6.0–8.5)

## 2020-06-21 LAB — TESTOSTERONE: Testosterone: 303 ng/dL (ref 264–916)

## 2020-06-21 LAB — LIPID PANEL
Chol/HDL Ratio: 4.4 ratio (ref 0.0–5.0)
Cholesterol, Total: 162 mg/dL (ref 100–199)
HDL: 37 mg/dL — ABNORMAL LOW (ref >39)
LDL Chol Calc (NIH): 102 mg/dL — ABNORMAL HIGH (ref 0–99)
Triglycerides: 126 mg/dL (ref 0–149)
VLDL Cholesterol Cal: 23 mg/dL (ref 5–40)

## 2020-06-21 MED ORDER — TESTOSTERONE 5.5 MG/ACT NA GEL: 100.0000 mg | Freq: Every day | NASAL | 2 refills | Status: DC

## 2020-06-21 MED ORDER — TESTOSTERONE 5.5 MG/ACT NA GEL: 10.0000 mg | Freq: Every day | NASAL | 2 refills | Status: DC

## 2020-06-26 DIAGNOSIS — K602 Anal fissure, unspecified: Secondary | ICD-10-CM | POA: Diagnosis not present

## 2020-06-26 DIAGNOSIS — K6289 Other specified diseases of anus and rectum: Secondary | ICD-10-CM | POA: Diagnosis not present

## 2020-08-01 ENCOUNTER — Ambulatory Visit: Payer: BC Managed Care – PPO | Admitting: Nurse Practitioner

## 2020-08-01 DIAGNOSIS — K6289 Other specified diseases of anus and rectum: Secondary | ICD-10-CM | POA: Diagnosis not present

## 2020-08-05 DIAGNOSIS — D1801 Hemangioma of skin and subcutaneous tissue: Secondary | ICD-10-CM | POA: Diagnosis not present

## 2020-08-05 DIAGNOSIS — L820 Inflamed seborrheic keratosis: Secondary | ICD-10-CM | POA: Diagnosis not present

## 2020-08-05 DIAGNOSIS — Z85828 Personal history of other malignant neoplasm of skin: Secondary | ICD-10-CM | POA: Diagnosis not present

## 2020-08-05 DIAGNOSIS — L821 Other seborrheic keratosis: Secondary | ICD-10-CM | POA: Diagnosis not present

## 2020-08-07 ENCOUNTER — Encounter: Payer: Self-pay | Admitting: Medical

## 2020-08-16 ENCOUNTER — Telehealth: Payer: Self-pay | Admitting: Medical

## 2020-08-16 ENCOUNTER — Other Ambulatory Visit: Payer: Self-pay | Admitting: Medical

## 2020-08-16 MED ORDER — NICOTINE 10 MG IN INHA: 1.0000 | RESPIRATORY_TRACT | 0 refills | Status: DC | PRN

## 2020-08-16 NOTE — Telephone Encounter (Signed)
Pt called & needs refill on his Nicotrol inhaler

## 2020-08-16 NOTE — Telephone Encounter (Signed)
done

## 2020-08-17 NOTE — Telephone Encounter (Signed)
rx sent in 

## 2020-08-19 ENCOUNTER — Other Ambulatory Visit (INDEPENDENT_AMBULATORY_CARE_PROVIDER_SITE_OTHER): Payer: BC Managed Care – PPO

## 2020-08-19 ENCOUNTER — Other Ambulatory Visit: Payer: Self-pay

## 2020-08-19 DIAGNOSIS — Z789 Other specified health status: Secondary | ICD-10-CM | POA: Diagnosis not present

## 2020-08-19 LAB — POC COVID19 BINAXNOW: SARS Coronavirus 2 Ag: NEGATIVE

## 2020-09-02 DIAGNOSIS — M9904 Segmental and somatic dysfunction of sacral region: Secondary | ICD-10-CM | POA: Diagnosis not present

## 2020-09-02 DIAGNOSIS — M9905 Segmental and somatic dysfunction of pelvic region: Secondary | ICD-10-CM | POA: Diagnosis not present

## 2020-09-02 DIAGNOSIS — M7918 Myalgia, other site: Secondary | ICD-10-CM | POA: Diagnosis not present

## 2020-09-02 DIAGNOSIS — M9903 Segmental and somatic dysfunction of lumbar region: Secondary | ICD-10-CM | POA: Diagnosis not present

## 2020-09-03 ENCOUNTER — Other Ambulatory Visit: Payer: Self-pay | Admitting: Medical

## 2020-09-03 DIAGNOSIS — K219 Gastro-esophageal reflux disease without esophagitis: Secondary | ICD-10-CM

## 2020-09-09 DIAGNOSIS — M9903 Segmental and somatic dysfunction of lumbar region: Secondary | ICD-10-CM | POA: Diagnosis not present

## 2020-09-09 DIAGNOSIS — M9905 Segmental and somatic dysfunction of pelvic region: Secondary | ICD-10-CM | POA: Diagnosis not present

## 2020-09-09 DIAGNOSIS — M9904 Segmental and somatic dysfunction of sacral region: Secondary | ICD-10-CM | POA: Diagnosis not present

## 2020-09-09 DIAGNOSIS — M7918 Myalgia, other site: Secondary | ICD-10-CM | POA: Diagnosis not present

## 2020-09-21 ENCOUNTER — Other Ambulatory Visit: Payer: Self-pay | Admitting: Medical

## 2020-09-23 ENCOUNTER — Other Ambulatory Visit: Payer: Self-pay | Admitting: Medical

## 2020-09-23 ENCOUNTER — Telehealth: Payer: Self-pay

## 2020-09-23 MED ORDER — TADALAFIL 20 MG PO TABS: 20.0000 mg | ORAL_TABLET | Freq: Every day | ORAL | 5 refills | Status: DC

## 2020-09-23 NOTE — Telephone Encounter (Signed)
Med sent.

## 2020-09-23 NOTE — Telephone Encounter (Signed)
Received a fax from [facility] for a refill on the pts. Tadalafil. Pt. Last apt was 06/20/20.

## 2020-09-24 ENCOUNTER — Other Ambulatory Visit: Payer: Self-pay | Admitting: Cardiology

## 2020-09-24 DIAGNOSIS — I712 Thoracic aortic aneurysm, without rupture, unspecified: Secondary | ICD-10-CM

## 2020-09-27 ENCOUNTER — Telehealth: Payer: Self-pay

## 2020-09-27 NOTE — Telephone Encounter (Signed)
P.A. TADALAFIL 

## 2020-10-03 DIAGNOSIS — M79671 Pain in right foot: Secondary | ICD-10-CM | POA: Diagnosis not present

## 2020-10-03 DIAGNOSIS — M79672 Pain in left foot: Secondary | ICD-10-CM | POA: Diagnosis not present

## 2020-10-03 NOTE — Telephone Encounter (Signed)
P.A. approved til 09/30/21, left message for pt

## 2020-10-04 ENCOUNTER — Telehealth: Payer: Self-pay

## 2020-10-04 ENCOUNTER — Other Ambulatory Visit: Payer: Self-pay | Admitting: Medical

## 2020-10-04 DIAGNOSIS — K219 Gastro-esophageal reflux disease without esophagitis: Secondary | ICD-10-CM

## 2020-10-04 MED ORDER — PANTOPRAZOLE SODIUM 40 MG PO TBEC: 40.0000 mg | DELAYED_RELEASE_TABLET | Freq: Every day | ORAL | 1 refills | Status: DC

## 2020-10-04 NOTE — Telephone Encounter (Signed)
Pt would like refills on his pantoprazole, last CPE 2/21 and said takes it daily and did get the last refill but had no refills on it and didn't know why.

## 2020-10-04 NOTE — Telephone Encounter (Signed)
done

## 2020-10-09 ENCOUNTER — Other Ambulatory Visit: Payer: BC Managed Care – PPO

## 2020-10-09 ENCOUNTER — Telehealth: Payer: Self-pay

## 2020-10-09 NOTE — Telephone Encounter (Signed)
Med sent.

## 2020-10-10 ENCOUNTER — Other Ambulatory Visit: Payer: Self-pay

## 2020-10-10 ENCOUNTER — Ambulatory Visit
Admission: RE | Admit: 2020-10-10 | Discharge: 2020-10-10 | Disposition: A | Discharge status: Home / Self Care | Payer: BC Managed Care – PPO | Source: Ambulatory Visit | Attending: Cardiology | Admitting: Cardiology

## 2020-10-10 DIAGNOSIS — I712 Thoracic aortic aneurysm, without rupture, unspecified: Secondary | ICD-10-CM

## 2020-10-10 DIAGNOSIS — I898 Other specified noninfective disorders of lymphatic vessels and lymph nodes: Secondary | ICD-10-CM | POA: Diagnosis not present

## 2020-10-10 DIAGNOSIS — K449 Diaphragmatic hernia without obstruction or gangrene: Secondary | ICD-10-CM | POA: Diagnosis not present

## 2020-10-10 DIAGNOSIS — Q331 Accessory lobe of lung: Secondary | ICD-10-CM | POA: Diagnosis not present

## 2020-10-10 DIAGNOSIS — J8410 Pulmonary fibrosis, unspecified: Secondary | ICD-10-CM | POA: Diagnosis not present

## 2020-10-10 IMAGING — CT CT ANGIO CHEST
1 series · 19 of 32 positions shown · IV contrast (iopamidol)
Comparison: [DATE], [DATE], [DATE]

CLINICAL DATA: 54-year-old male with a history of aortic aneurysm

EXAM:
CT ANGIOGRAPHY CHEST WITH CONTRAST
TECHNIQUE: Multidetector CT imaging of the chest was performed using the
standard protocol during bolus administration of intravenous
contrast. Multiplanar CT image reconstructions and MIPs were
obtained to evaluate the vascular anatomy.
CONTRAST:  75mL [Z5] IOPAMIDOL ([Z5]) INJECTION 76%

[Series 4: chest angio · axial · 0.76mm/px · z∈[-363,-48]mm · 19 of 113 slices shown]
[im 4/113  lung]
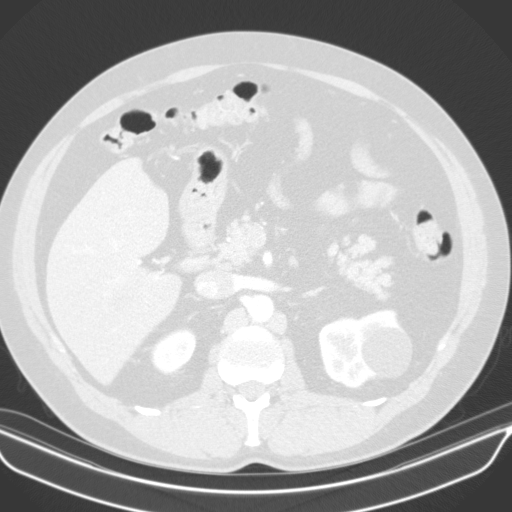
[im 11/113  soft-tissue]
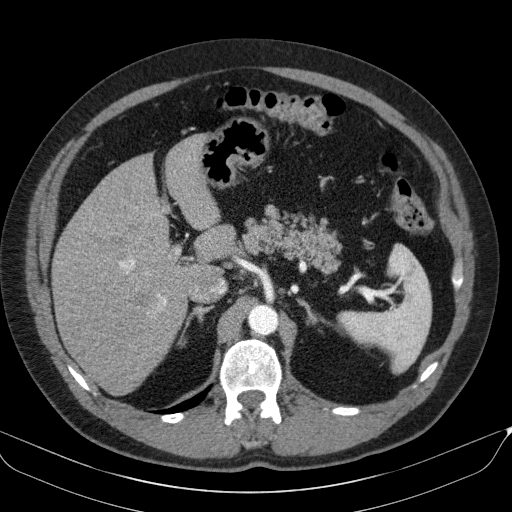
[im 15/113  lung]
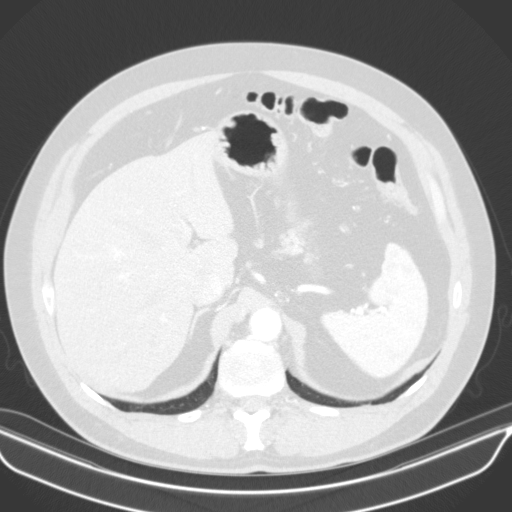
[im 22/113  soft-tissue]
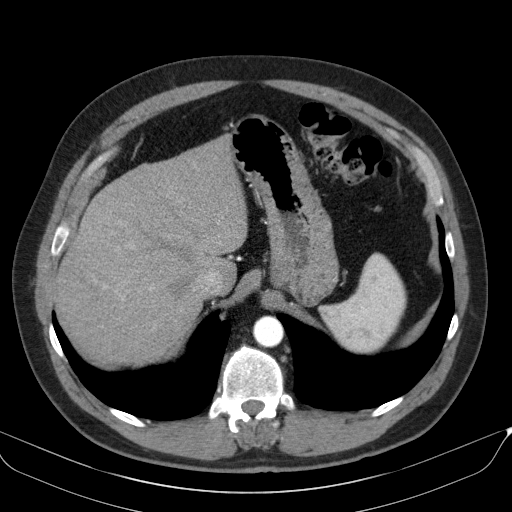
[im 29/113  lung]
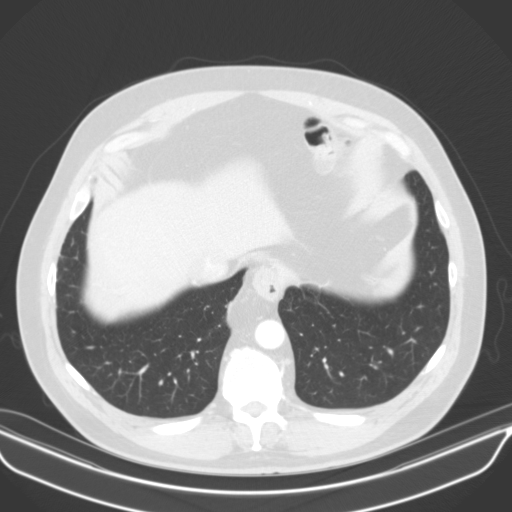
[im 33/113  soft-tissue]
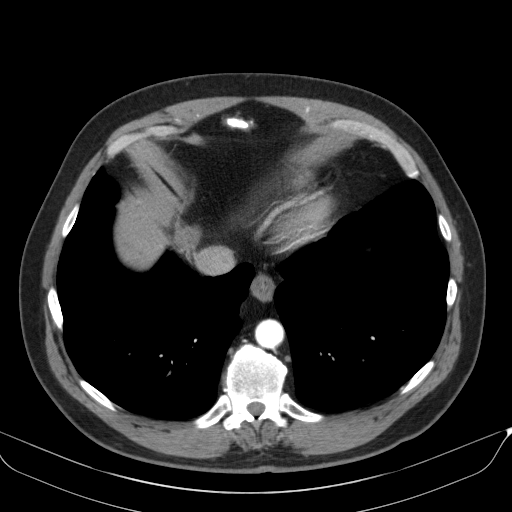
[im 40/113  lung]
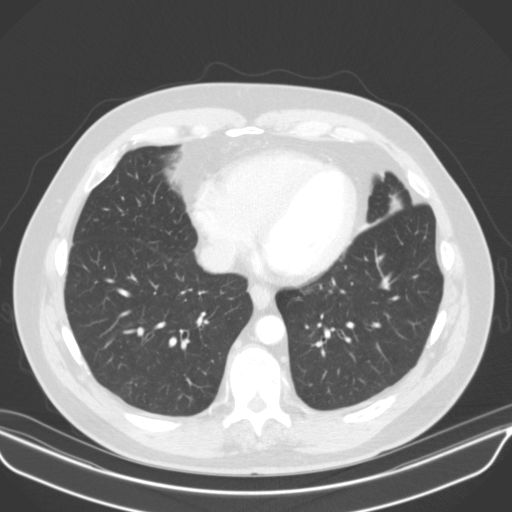
[im 44/113  soft-tissue]
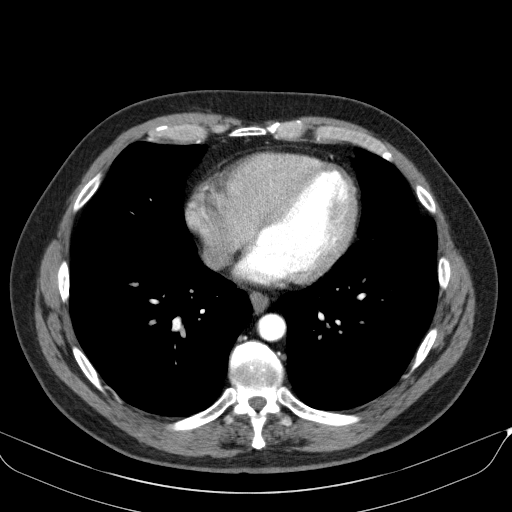
[im 51/113  lung]
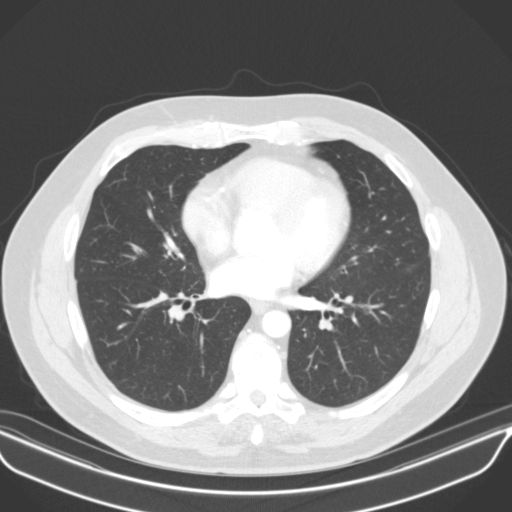
[im 58/113  soft-tissue]
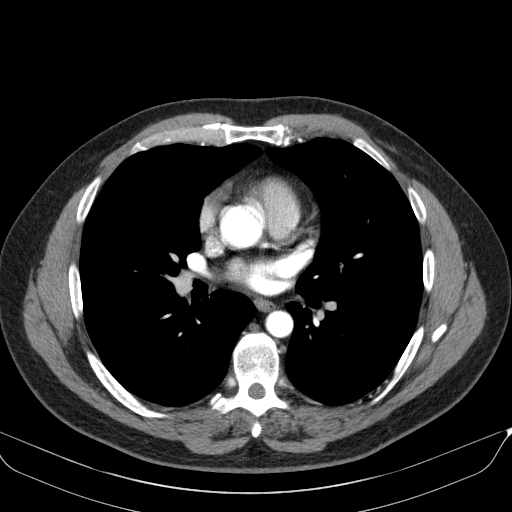
[im 62/113  lung]
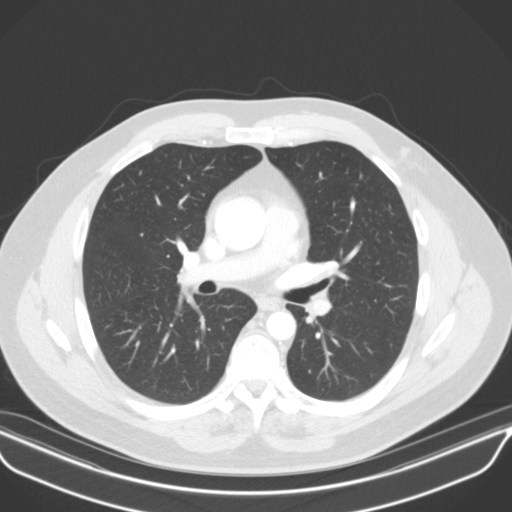
[im 69/113  soft-tissue]
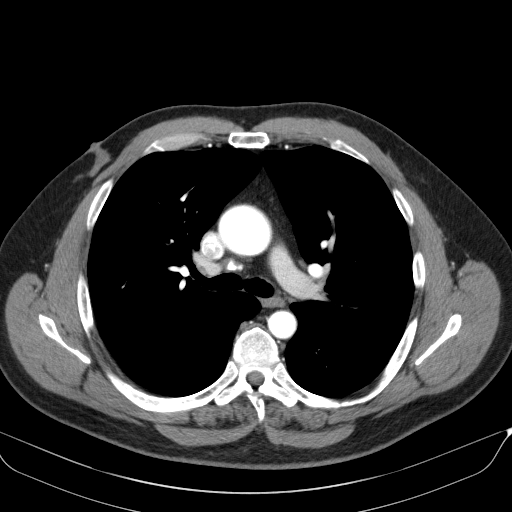
[im 73/113  lung]
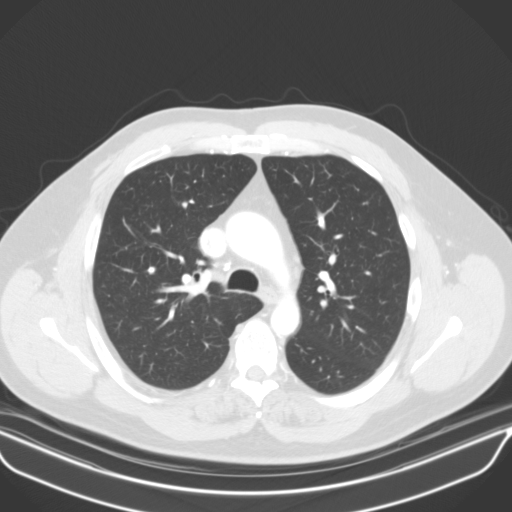
[im 80/113  soft-tissue]
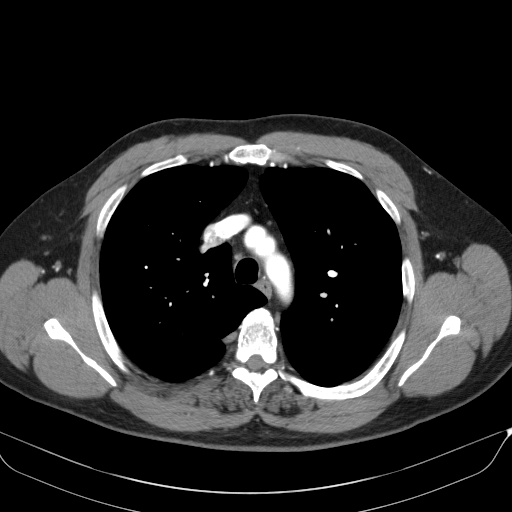
[im 84/113  lung]
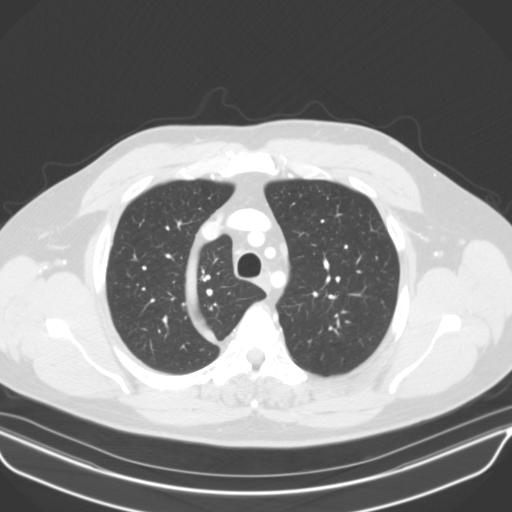
[im 91/113  soft-tissue]
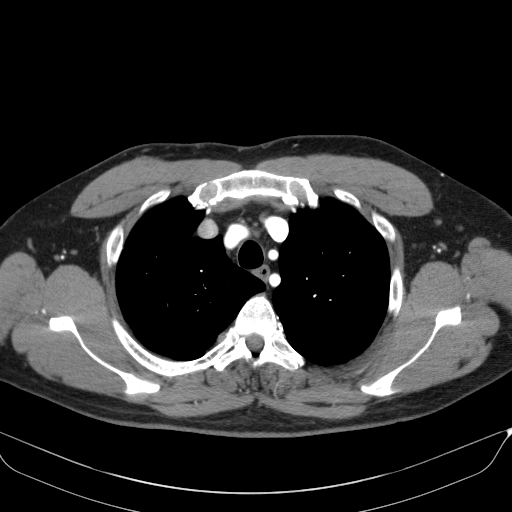
[im 98/113  lung]
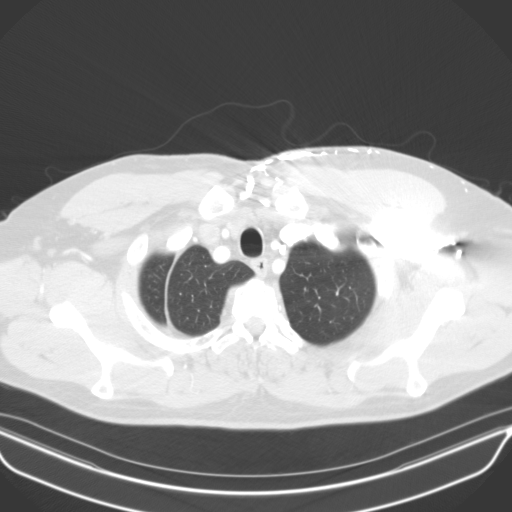
[im 102/113  soft-tissue]
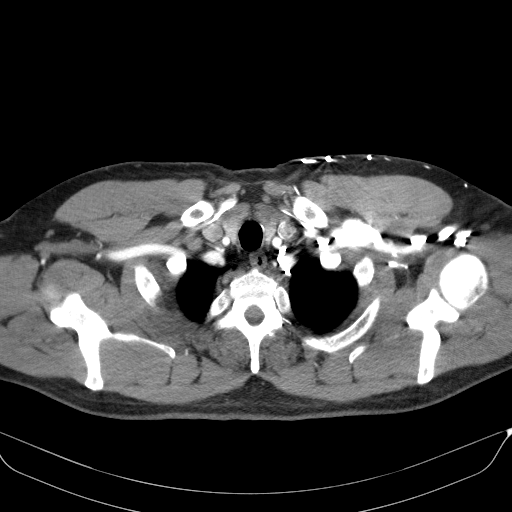
[im 109/113  lung]
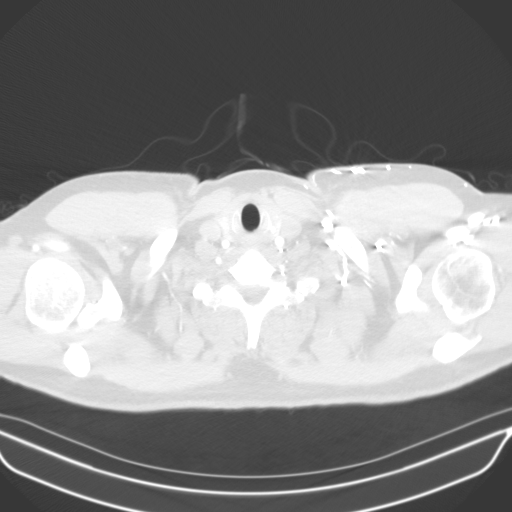

[19 of 32 positions shown; findings below may reference images not displayed]

FINDINGS: Cardiovascular:

Heart:

No cardiomegaly. No pericardial fluid/thickening. No significant
coronary calcifications.

Aorta:

No significant aortic valve calcification. Ascending aorta estimated
3.8 cm on the current non gated CT. Prior estimate [DATE]
cm. No dissection. No significant atherosclerotic calcifications. No
periaortic fluid.

Pulmonary arteries:

Timing of the contrast bolus is not optimized for evaluation of
pulmonary artery filling defects.

Mediastinum/Nodes: Calcified mediastinal and right axillary lymph
nodes. No lymphadenopathy. Unremarkable appearance of the thoracic
esophagus.

Unremarkable appearance of the thoracic inlet.

Lungs/Pleura: Redemonstration of partially calcified granuloma of
the right upper lobe with associated nodularity along the accessory
fissure of the azygos lobe. There has been resolution of the
previous nodularity/ground-glass changes, with no confluent airspace
disease pneumothorax or pleural effusion.

Upper Abdomen: Hiatal hernia. No acute finding of the upper abdomen.

Musculoskeletal: No acute displaced fracture. Degenerative changes
of the spine.

Review of the MIP images confirms the above findings.
IMPRESSION: Greatest estimated diameter of the ascending aorta on this non gated
study 3.8 cm, which is no larger than the prior comparison CTs.

Interval resolution of infectious/inflammatory changes of the lungs,
with redemonstration of stigmata of prior granulomatous disease.

## 2020-10-10 MED ORDER — IOPAMIDOL (ISOVUE-370) INJECTION 76%
75.0000 mL | Freq: Once | INTRAVENOUS | Status: AC | PRN
  Administered 2020-10-10: 75 mL via INTRAVENOUS

## 2020-10-31 ENCOUNTER — Encounter: Payer: Self-pay | Admitting: Cardiology

## 2020-10-31 ENCOUNTER — Other Ambulatory Visit: Payer: Self-pay

## 2020-10-31 ENCOUNTER — Ambulatory Visit: Payer: BC Managed Care – PPO | Admitting: Cardiology

## 2020-10-31 VITALS — BP 120/80 | HR 80 | Ht 71.0 in | Wt 231.0 lb

## 2020-10-31 DIAGNOSIS — E7800 Pure hypercholesterolemia, unspecified: Secondary | ICD-10-CM | POA: Diagnosis not present

## 2020-10-31 DIAGNOSIS — I10 Essential (primary) hypertension: Secondary | ICD-10-CM

## 2020-10-31 DIAGNOSIS — I712 Thoracic aortic aneurysm, without rupture, unspecified: Secondary | ICD-10-CM

## 2020-10-31 MED ORDER — ROSUVASTATIN CALCIUM 10 MG PO TABS: 10.0000 mg | ORAL_TABLET | Freq: Every day | ORAL | 3 refills | Status: DC

## 2020-10-31 NOTE — Progress Notes (Signed)
Primary Physician/Referring:  Carlena Hurl, PA-C  Patient ID: Cody Tapia, male    DOB: 23-Mar-1966, 54 y.o.   MRN: [id number]  Chief Complaint  Patient presents with  . Hypertension    1 year f/u    HPI:    Cody Tapia  is a 54 y.o. male  with chronic tobacco use, mild obesity, hyperlipidemia, tobacco use disorder, smokes about 1 pack of cigarettes a day and erectile dysfunction, CT scan of the chest on 11/18/2018 demonstrated mid ascending thoracic aorta measuring 40 mm indicating fusiform dilatation, no coronary artery calcification, but found calcified granulomas in the right upper lobe, azygos lobe, and right hilum-mediastinum. Patient denies a family history of aneursyms.   Patient presents for annual follow up.  He has remained abstinent from tobacco use.  He has been trying to exercise and be physically active as much as possible.  He has no specific complaints today.  Past Medical History:  Diagnosis Date  . 23-polyvalent pneumococcal polysaccharide vaccine declined 7/14  . Anxiety    prior therapy with counselor, high stress job, 12+ hour work days  . Asthma   . ED (erectile dysfunction)   . Former smoker 2021  . GERD (gastroesophageal reflux disease)    pulmonology consult 2020  . Hypercholesteremia   . Hyperglycemia   . Hypertension   . Obesity   . Thoracic ascending aortic aneurysm ([place])   . Wears glasses    Past Surgical History:  Procedure Laterality Date  . COLONOSCOPY  2009   [facility] for abdominal pain  . COLONOSCOPY  12/2018   polyps TA, diverticulosis, Dr. Wilfrid Lund  . ESOPHAGOGASTRODUODENOSCOPY  2009   [facility], abdominal pain  . EYE SURGERY     strabismus surgery, right  . WISDOM TOOTH EXTRACTION     Social History   Tobacco Use  . Smoking status: Former Smoker    Packs/day: 0.25    Years: 30.00    Pack years: 7.50    Types: Cigarettes    Quit date: 10/14/2019    Years since quitting: 1.0  . Smokeless  tobacco: Never Used  Substance Use Topics  . Alcohol use: Yes    Alcohol/week: 3.0 standard drinks    Types: 3 Glasses of wine per week   Marital Status: Single  ROS  Review of Systems  Cardiovascular: Negative for chest pain, dyspnea on exertion and leg swelling.  Gastrointestinal: Negative for melena.   Objective  Blood pressure 120/80, pulse 80, height 5' 11"  (1.803 m), weight 231 lb (104.8 kg), SpO2 96 %.  Vitals with BMI 10/31/2020 06/20/2020 01/10/2020  Height 5' 11"  5' 11"  5' 11"   Weight 231 lbs 226 lbs 3 oz 229 lbs 13 oz  BMI 32.23 93.81 01.75  Systolic 102 585 277  Diastolic 80 80 86  Pulse 80 91 78    Physical Exam Constitutional:      Appearance: He is obese.     Comments: He is well-built and mildly obese in no acute distress.  HENT:     Head: Atraumatic.  Eyes:     Conjunctiva/sclera: Conjunctivae normal.  Neck:     Thyroid: No thyromegaly.  Cardiovascular:     Rate and Rhythm: Normal rate and regular rhythm.     Pulses: Intact distal pulses.     Heart sounds: Normal heart sounds. No murmur heard. No gallop.   Pulmonary:     Effort: Pulmonary effort is normal.     Breath sounds:  Normal breath sounds.  Abdominal:     General: Bowel sounds are normal.     Palpations: Abdomen is soft.  Musculoskeletal:        General: Normal range of motion.     Cervical back: Neck supple.  Skin:    General: Skin is warm and dry.  Neurological:     Mental Status: He is alert.    Laboratory examination:   Recent Labs    06/20/20 1049  NA 137  K 4.6  CL 102  CO2 20  GLUCOSE 99  BUN 19  CREATININE 1.13  CALCIUM 9.4  GFRNONAA 75  GFRAA 87   CrCl cannot be calculated (Patient's most recent lab result is older than the maximum 21 days allowed.).  CMP Latest Ref Rng & Units 06/20/2020 10/17/2019 10/16/2019  Glucose 65 - 99 mg/dL 99 135(H) 148(H)  BUN 6 - 24 mg/dL 19 26(H) 17  Creatinine 0.76 - 1.27 mg/dL 1.13 1.02 1.06  Sodium 134 - 144 mmol/L 137 138 138   Potassium 3.5 - 5.2 mmol/L 4.6 4.8 4.8  Chloride 96 - 106 mmol/L 102 105 102  CO2 20 - 29 mmol/L 20 23 27   Calcium 8.7 - 10.2 mg/dL 9.4 9.5 9.4  Total Protein 6.0 - 8.5 g/dL 7.2 6.7 -  Total Bilirubin 0.0 - 1.2 mg/dL 0.7 0.7 -  Alkaline Phos 48 - 121 IU/L 126(H) 86 -  AST 0 - 40 IU/L 21 16 -  ALT 0 - 44 IU/L 26 23 -   CBC Latest Ref Rng & Units 01/10/2020 10/17/2019 10/16/2019  WBC 3.4 - 10.8 x10E3/uL 6.9 20.6(H) 11.2(H)  Hemoglobin 13.0 - 17.7 g/dL 15.9 14.4 15.6  Hematocrit 37.5 - 51.0 % 46.5 43.0 46.2  Platelets 150 - 450 x10E3/uL 318 274 270   Lipid Panel     Component Value Date/Time   CHOL 162 06/20/2020 1049   TRIG 126 06/20/2020 1049   HDL 37 (L) 06/20/2020 1049   CHOLHDL 4.4 06/20/2020 1049   CHOLHDL 4.3 01/14/2017 0914   VLDL 14 01/14/2017 0914   LDLCALC 102 (H) 06/20/2020 1049   HEMOGLOBIN A1C Lab Results  Component Value Date   HGBA1C 5.6 01/10/2020   MPG 105 01/14/2017   TSH Recent Labs    01/10/20 0919  TSH 1.710     Labs 03/07/2018: Serum glucose 98 mg, BUN 18, creatinine 1.13, eGFR greater than 69, potassium 4.2.  CMP otherwise normal.  CBC normal, total cholesterol 217, triglycerides 52, HDL 64, LDL 143.  Non-HDL cholesterol 165.  A1c 5.7%.  Normal TSH in 2018.   Medications and allergies   Allergies  Allergen Reactions  . Chantix [Varenicline Tartrate]     Sleep walking  . Wellbutrin [Bupropion]     Sleepwalking, intolerance     Current Outpatient Medications  Medication Instructions  . budesonide-formoterol (SYMBICORT) 160-4.5 MCG/ACT inhaler 2 puffs, Inhalation, 2 times daily  . irbesartan (AVAPRO) 300 mg, Oral, Daily  . meloxicam (MOBIC) 15 mg, Oral, Daily  . Multiple Vitamin (MULTIVITAMIN) tablet 1 tablet, Oral, Daily  . pantoprazole (PROTONIX) 40 mg, Oral, Daily  . rosuvastatin (CRESTOR) 10 mg, Oral, Daily  . tadalafil (CIALIS) 20 MG tablet TAKE 1 TABLET BY MOUTH ONCE DAILY AS DIRECTED  . Testosterone 100 mg, Topical, Daily  .  valACYclovir (VALTREX) 1,000 mg, Oral, As needed    Radiology:  No results found.   Cardiac Studies:   Echocardiogram 01/17/2019: Left ventricle cavity is normal in size. Mild concentric hypertrophy of  the left ventricle. Normal global wall motion. Normal diastolic filling pattern. Calculated EF 51%. No significant valvular abnormalities. IVC is dilated with respiratory variation. This may suggest elevated right heart pressure   Exercise sestamibi stress test 01/06/2019: 1. The patient performed treadmill exercise using Bruce protocol, completing 9:37 minutes. The patient completed an estimated workload of 11.1 METS, reaching 92% of the maximum predicted heart rate. Exercise capacity was excellent. Hemodynamic response was normal. Stress symptoms included fatigue. No ischemic changes seen on stress electrocardiogram. 2. The overall quality of the study is excellent. There is no evidence of abnormal lung activity. Stress and rest SPECT images demonstrate homogeneous tracer distribution throughout the myocardium. Gated SPECT imaging reveals normal myocardial thickening and wall motion. The left ventricular ejection fraction was normal (66%).   3. Low risk study.  CTA chest 10/16/2019: 1. Examination is generally limited by breath motion artifact. Within this limitation, there are scattered, generally peribronchovascular and somewhat geographic ground-glass opacities, which are fluctuant in comparison to prior examination dated 10/02/2019 (series 10, image 96, 61). Mild bronchial wall thickening. No significant air trapping on expiratory phase imaging. These opacities are nonspecific and infectious or inflammatory given fluctuant nature. No evidence of fibrotic interstitial lung disease. 2. Calcific stigma of prior granulomatous disease of the right lung. 3. The tubular ascending thoracic aorta is enlarged, measuring 4.2 x4.1 cm, not significantly changed compared to prior examinations, CTA Chest   11/18/2018   CTA chest 10/10/2020:  Greatest estimated diameter of the ascending aorta on this non gated study 3.8 cm, which is no larger than the prior comparison CTs. No significant aortic valve calcification.  Interval resolution of infectious/inflammatory changes of the lungs, with redemonstration of stigmata of prior granulomatous disease.  EKG     EKG 10/31/2020: Normal sinus rhythm with rate of 80 bpm, normal axis.  No evidence of ischemia, normal EKG. no change from 10/15/2019.   Assessment     ICD-10-CM   1. Essential hypertension  I10 EKG 12-Lead  2. Thoracic aortic aneurysm without rupture (HCC)  I71.2   3. Hypercholesteremia  E78.00 rosuvastatin (CRESTOR) 10 MG tablet       Meds ordered this encounter  Medications  . rosuvastatin (CRESTOR) 10 MG tablet    Sig: Take 1 tablet (10 mg total) by mouth daily.    Dispense:  90 tablet    Refill:  3   Medications Discontinued During This Encounter  Medication Reason  . albuterol (PROVENTIL) (2.5 MG/3ML) 0.083% nebulizer solution Completed Course  . albuterol (VENTOLIN HFA) 108 (90 Base) MCG/ACT inhaler Completed Course  . nicotine (NICODERM CQ - DOSED IN MG/24 HOURS) 21 mg/24hr patch Completed Course  . nicotine (NICOTROL) 10 MG inhaler Completed Course  . Phentermine-Topiramate (QSYMIA) 7.5-46 MG CP24 Completed Course  . tadalafil (CIALIS) 20 MG tablet Completed Course  . rosuvastatin (CRESTOR) 5 MG tablet Reorder    Recommendations:    Cody Tapia  is a 54 y.o. EKG 1122 male  with chronic tobacco use quit in 2020, mild obesity, hyperlipidemia, erectile dysfunction, CT scan of the chest on 11/18/2018 demonstrated mid ascending thoracic aorta measuring 38 to 40 mm indicating fusiform dilatation, no coronary artery calcification, but found calcified granulomas in the right upper lobe, azygos lobe, and right hilum-mediastinum.   Patient presents here for annual visit.  His physical examination is improved with regard  to crackles that were heard previously in his lungs.  His examination is completely normal except for obesity and BMI of 32.  His blood pressure is also normal and a normal EKG.  I reviewed the results of the CT angiogram of the chest, aortic aneurysm appears to be very small.  As it has remained stable for 2 years, we could consider doing a CT scan every other year.  With regard to hyperlipidemia, his LDL is nearly at goal which is <100, will increase his Crestor to 10 mg daily.  He remains abstinent from tobacco use, I did not make any other changes, I will see him back in 2 years and will repeat CT angiogram prior to his next office visit in 2 years.   Adrian Prows, MD, [facility] 10/31/2020, 1:17 PM Office: [phone] Pager: [phone]

## 2020-11-11 ENCOUNTER — Telehealth: Payer: BC Managed Care – PPO | Admitting: Medical

## 2020-11-11 ENCOUNTER — Other Ambulatory Visit (INDEPENDENT_AMBULATORY_CARE_PROVIDER_SITE_OTHER): Payer: BC Managed Care – PPO

## 2020-11-11 ENCOUNTER — Encounter: Payer: Self-pay | Admitting: Medical

## 2020-11-11 ENCOUNTER — Other Ambulatory Visit: Payer: Self-pay

## 2020-11-11 VITALS — Ht 71.0 in | Wt 225.0 lb

## 2020-11-11 DIAGNOSIS — Z20822 Contact with and (suspected) exposure to covid-19: Secondary | ICD-10-CM

## 2020-11-11 DIAGNOSIS — R5383 Other fatigue: Secondary | ICD-10-CM

## 2020-11-11 LAB — POCT INFLUENZA A/B
Influenza A, POC: NEGATIVE
Influenza B, POC: NEGATIVE

## 2020-11-11 NOTE — Progress Notes (Signed)
Subjective:     Patient ID: Cody Tapia, male   DOB: 06/04/1966, 54 y.o.   MRN: [id number]  This visit type was conducted due to national recommendations for restrictions regarding the COVID-19 Pandemic (e.g. social distancing) in an effort to limit this patient's exposure and mitigate transmission in our community.  Due to their co-morbid illnesses, this patient is at least at moderate risk for complications without adequate follow up.  This format is felt to be most appropriate for this patient at this time.    Documentation for virtual audio and video telecommunications through [facility] encounter:  The patient was located at home. The provider was located in the office. The patient did consent to this visit and is aware of possible charges through their insurance for this visit.  The other persons participating in this telemedicine service were fiance. Time spent on call was 20 minutes and in review of previous records 20 minutes total.  This virtual service is not related to other E/M service within previous 7 days.   HPI Chief Complaint  Patient presents with  . Covid Exposure    Felt tired. Fiance tested positive today. Wants to be tested    Virtual consult for desire for covid testing.  Has had some potential recent covid exposures.  Was around a lot of people shoulder to shoulder in [place] around 8 days ago.   Was at an event last week around a lot of people.  Had a cowoker last week that was +.  And now fiance tested + this morning.   He and fiance think they were exposed in [place] 7-8 days ago  In [place].   He and fiance only have fatigue as a symptoms.  otherwise they feel fine.  They are concerned about being contagious around family this weekend.    His mother is in terrible health and doesn't want to get her sick.   He has had covid vaccine and booster.  Mother has also been vaccinated.   No other aggravating or relieving factors. No other complaint.  Past Medical History:   Diagnosis Date  . 23-polyvalent pneumococcal polysaccharide vaccine declined 7/14  . Anxiety    prior therapy with counselor, high stress job, 12+ hour work days  . Asthma   . ED (erectile dysfunction)   . Former smoker 2021  . GERD (gastroesophageal reflux disease)    pulmonology consult 2020  . Hypercholesteremia   . Hyperglycemia   . Hypertension   . Obesity   . Thoracic ascending aortic aneurysm ([place])   . Wears glasses    Current Outpatient Medications on File Prior to Visit  Medication Sig Dispense Refill  . budesonide-formoterol (SYMBICORT) 160-4.5 MCG/ACT inhaler Inhale 2 puffs into the lungs 2 (two) times daily. 1 Inhaler 11  . irbesartan (AVAPRO) 300 MG tablet Take 1 tablet (300 mg total) by mouth daily. 90 tablet 3  . meloxicam (MOBIC) 15 MG tablet Take 15 mg by mouth daily.    . Multiple Vitamin (MULTIVITAMIN) tablet Take 1 tablet by mouth daily.    . pantoprazole (PROTONIX) 40 MG tablet Take 1 tablet (40 mg total) by mouth daily. 90 tablet 1  . rosuvastatin (CRESTOR) 10 MG tablet Take 1 tablet (10 mg total) by mouth daily. 90 tablet 3  . tadalafil (CIALIS) 20 MG tablet TAKE 1 TABLET BY MOUTH ONCE DAILY AS DIRECTED 8 tablet 0  . Testosterone 5.5 MG/ACT GEL Apply 100 mg topically daily. 3 g 2  . valACYclovir (VALTREX)  1000 MG tablet Take 1,000 mg by mouth as needed.    Mellody Memos 3 MG TABS      No current facility-administered medications on file prior to visit.    Review of Systems As in subjective    Objective:   Physical Exam Due to coronavirus pandemic stay at home measures, patient visit was virtual and they were not examined in person.   Ht 5\' 11"  (1.803 m)   Wt 225 lb (102.1 kg)   BMI 31.38 kg/m       Assessment:     Encounter Diagnoses  Name Primary?  . Fatigue, unspecified type Yes  . Close exposure to COVID-19 virus        Plan:     Discussed symptoms and concerns.  Discussed limitations of virtual consult, and limitations of testing.  He  will come to our back parking lot today for testing.  We discussed quarantine precautions based on dates of possible recent exposure.  In general rest, hydrate well, quarantine until we have tests results.  Discussed covid symptoms     Cody Tapia was seen today for covid exposure.  Diagnoses and all orders for this visit:  Fatigue, unspecified type -     POC COVID-19 BinaxNow; Future -     Novel Coronavirus, NAA ([facility]); Future  Close exposure to COVID-19 virus -     POC COVID-19 BinaxNow; Future -     Novel Coronavirus, NAA ([facility]); Future  f/u for covid testing.

## 2020-11-12 LAB — NOVEL CORONAVIRUS, NAA: SARS-CoV-2, NAA: NOT DETECTED

## 2020-11-12 LAB — SARS-COV-2, NAA 2 DAY TAT

## 2020-11-26 ENCOUNTER — Other Ambulatory Visit: Payer: Self-pay | Admitting: Cardiology

## 2020-11-26 DIAGNOSIS — I10 Essential (primary) hypertension: Secondary | ICD-10-CM

## 2020-11-28 ENCOUNTER — Other Ambulatory Visit: Payer: Self-pay | Admitting: Cardiology

## 2020-11-28 DIAGNOSIS — I10 Essential (primary) hypertension: Secondary | ICD-10-CM

## 2020-12-02 DIAGNOSIS — K6289 Other specified diseases of anus and rectum: Secondary | ICD-10-CM | POA: Diagnosis not present

## 2020-12-07 ENCOUNTER — Other Ambulatory Visit: Payer: Self-pay | Admitting: General Surgery

## 2020-12-07 DIAGNOSIS — K6289 Other specified diseases of anus and rectum: Secondary | ICD-10-CM

## 2020-12-27 ENCOUNTER — Other Ambulatory Visit: Payer: Self-pay

## 2020-12-27 ENCOUNTER — Ambulatory Visit
Admission: RE | Admit: 2020-12-27 | Discharge: 2020-12-27 | Disposition: A | Discharge status: Home / Self Care | Payer: BC Managed Care – PPO | Source: Ambulatory Visit | Attending: General Surgery | Admitting: General Surgery

## 2020-12-27 DIAGNOSIS — K603 Anal fistula: Secondary | ICD-10-CM | POA: Diagnosis not present

## 2020-12-27 DIAGNOSIS — K6289 Other specified diseases of anus and rectum: Secondary | ICD-10-CM

## 2020-12-27 IMAGING — MR MR PELVIS WO/W CM
8 series · 48 of 48 positions shown · IV contrast (multihance)
Comparison: None.

CLINICAL DATA: Anal pain and fistula.

EXAM:
MRI PELVIS WITHOUT AND WITH CONTRAST
TECHNIQUE: Multiplanar multisequence MR imaging of the pelvis was performed
both before and after administration of intravenous contrast.
CONTRAST:  20mL MULTIHANCE GADOBENATE DIMEGLUMINE 529 MG/ML IV SOLN

[Series 3: t2_tse_sag · sagittal · 2.5mm · 0.81mm/px · 7 of 50 slices shown]
[im 1/50]
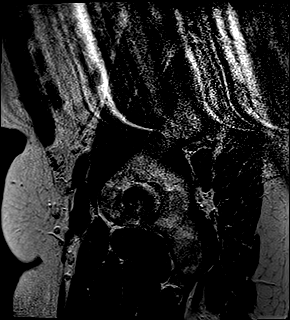
[im 9/50]
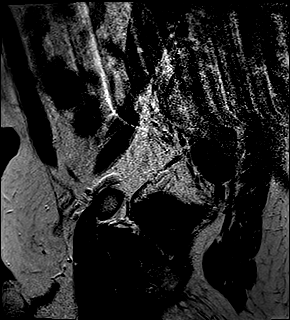
[im 17/50]
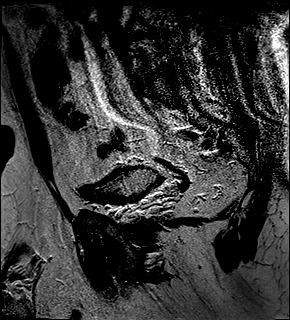
[im 25/50]
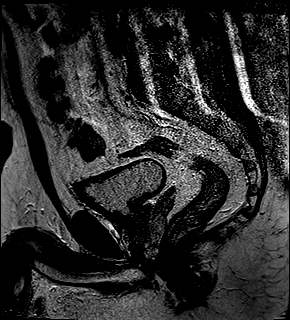
[im 33/50]
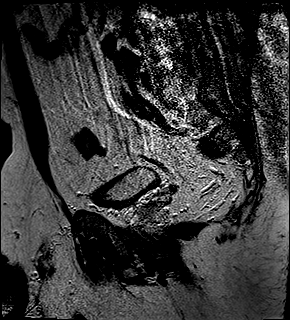
[im 41/50]
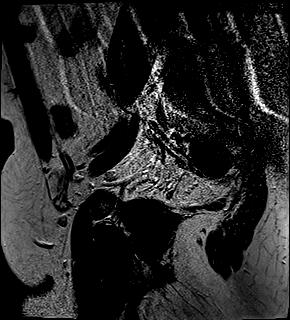
[im 50/50]
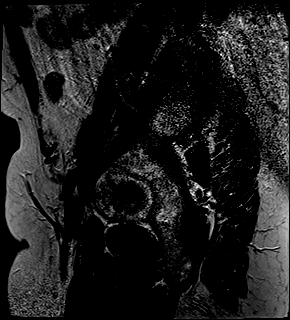

[Series 4: t2_tse_sag fs · sagittal · 2.5mm · 1.02mm/px · 7 of 49 slices shown]
[im 1/49]
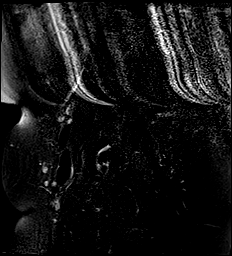
[im 9/49]
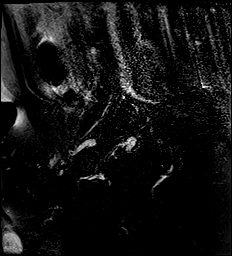
[im 17/49]
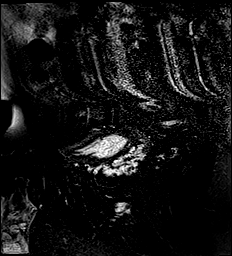
[im 25/49]
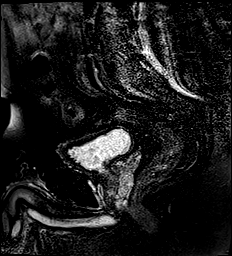
[im 33/49]
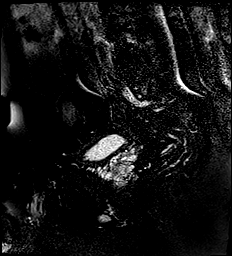
[im 41/49]
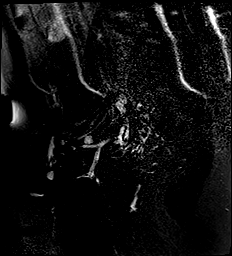
[im 49/49]
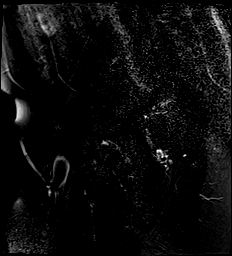

[Series 5: t1_tse axial obl · axial · 4.0mm · 0.57mm/px · z∈[-267,-106]mm · 6 of 36 slices shown (1 of 2)]
[im 1/36]
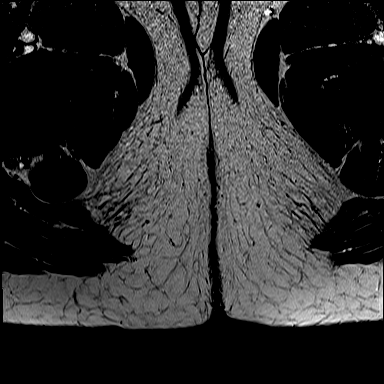
[im 8/36]
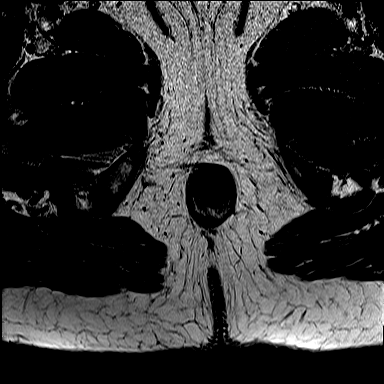
[im 15/36]
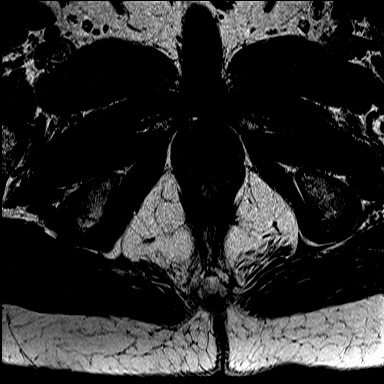
[im 22/36]
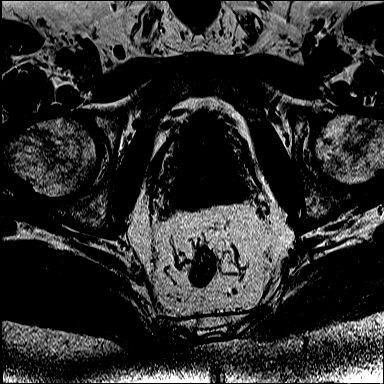
[im 29/36]
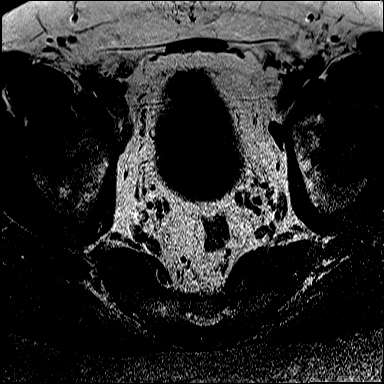
[im 36/36]
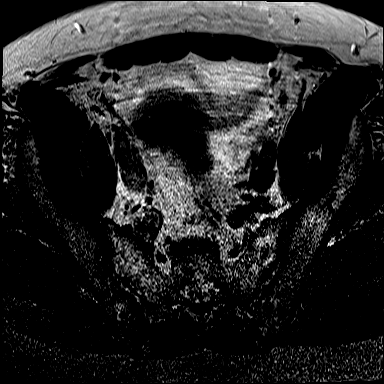

[Series 6: t2_tse axial obl · axial · 4.0mm · 0.86mm/px · z∈[-267,-106]mm · 6 of 36 slices shown (1 of 2)]
[im 1/36]
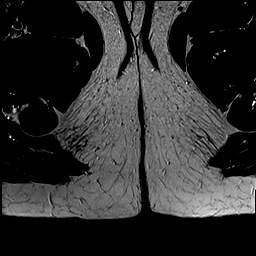
[im 8/36]
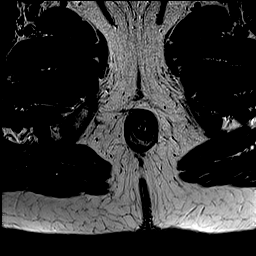
[im 15/36]
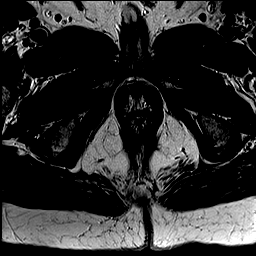
[im 22/36]
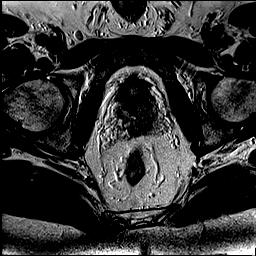
[im 29/36]
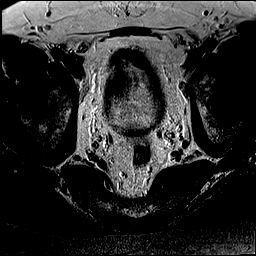
[im 36/36]
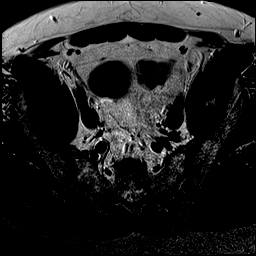

[Series 7: t2_tse axial obl · axial · 4.0mm · 0.88mm/px · z∈[-268,-107]mm · 6 of 36 slices shown (2 of 2)]
[im 1/36]
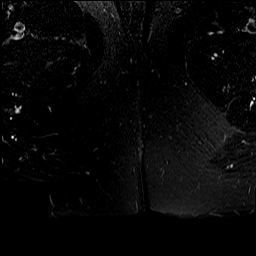
[im 8/36]
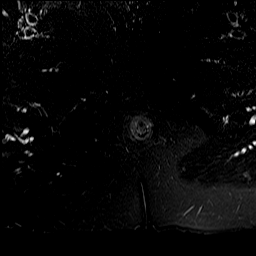
[im 15/36]
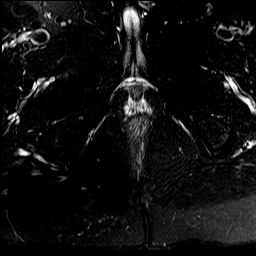
[im 22/36]
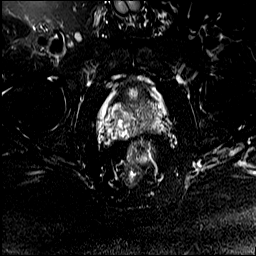
[im 29/36]
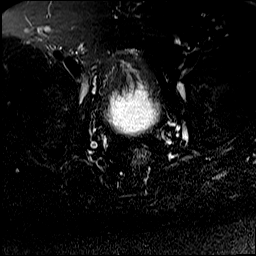
[im 36/36]
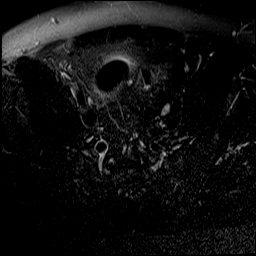

[Series 8: T2 fat-sat · coronal · 4.0mm · 0.94mm/px · 5 of 33 slices shown]
[im 1/33]
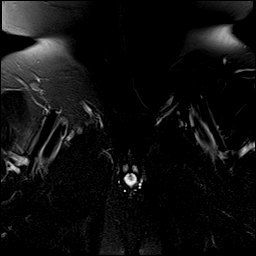
[im 9/33]
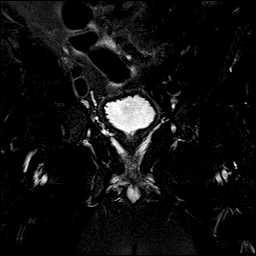
[im 17/33]
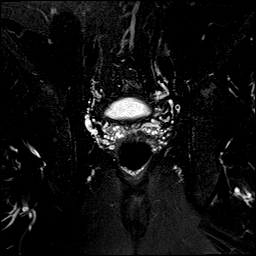
[im 25/33]
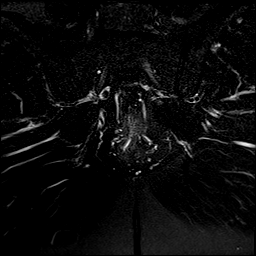
[im 33/33]
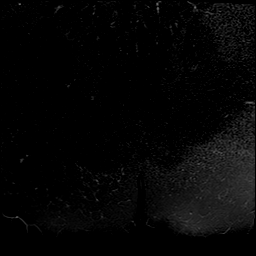

[Series 9: post t1_tse cor · coronal · 4.0mm · 0.54mm/px · 5 of 33 slices shown]
[im 1/33]
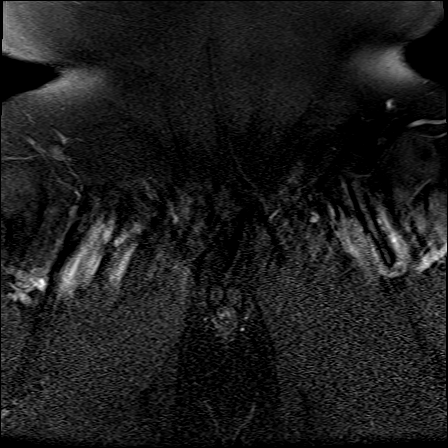
[im 9/33]
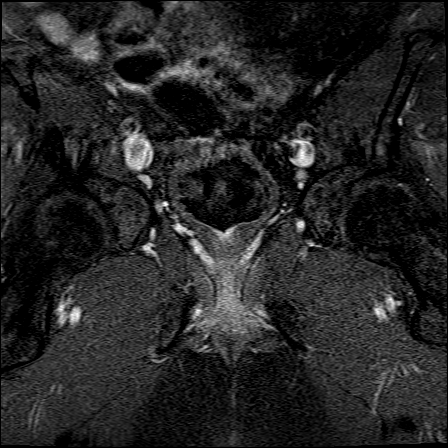
[im 17/33]
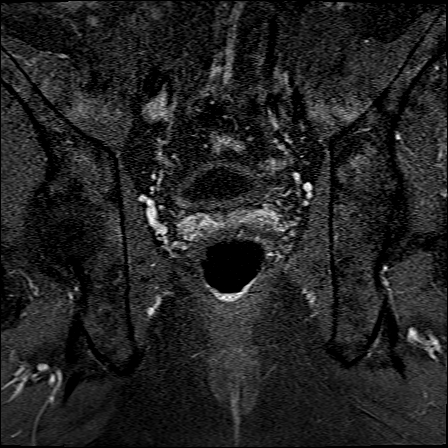
[im 25/33]
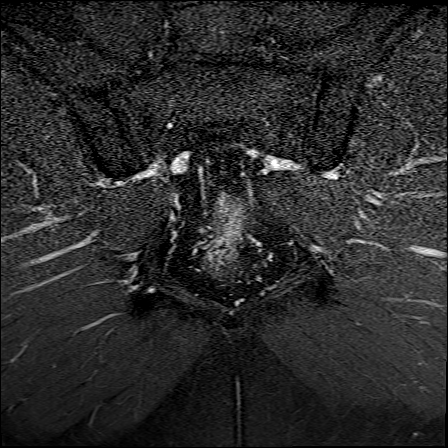
[im 33/33]
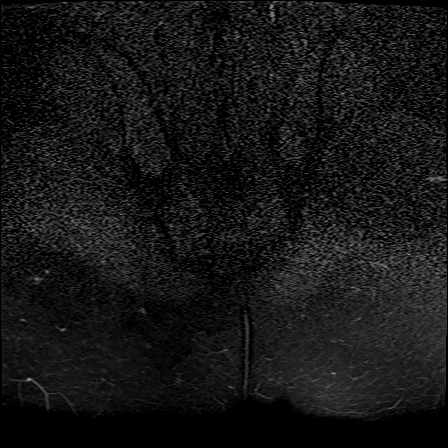

[Series 10: t1_tse axial obl · axial · 4.0mm · 0.94mm/px · z∈[-270,-109]mm · 6 of 36 slices shown (2 of 2)]
[im 1/36]
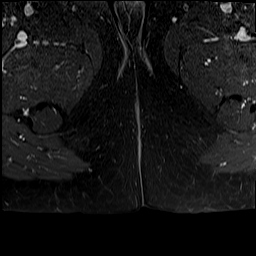
[im 8/36]
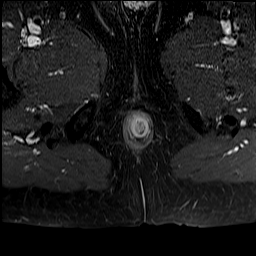
[im 15/36]
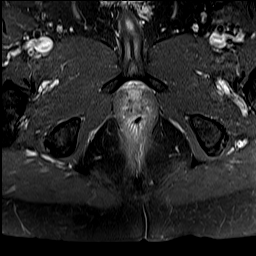
[im 22/36]
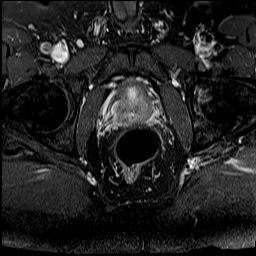
[im 29/36]
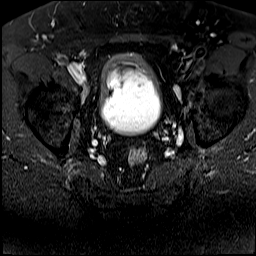
[im 36/36]
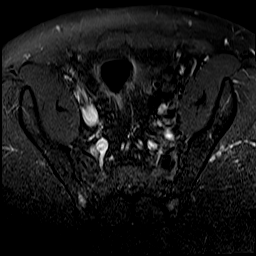

[48 of 48 positions shown; findings below may reference images not displayed]

FINDINGS: Urinary Tract: The urinary bladder appears normal for the degree of
distention.

Bowel: Visualized bowel loops of the pelvis are nondilated. There is
no perirectal or perianal edema/inflammation. No perirectal/perianal
fistula. No perirectal/perianal abscess. No evidence for mass lesion
in the lower rectum/anus.

Vascular/Lymphatic: No pathologically enlarged lymph nodes. No
significant vascular abnormality seen.

Reproductive: The prostate gland and seminal vesicles are
unremarkable.

Other:  No intraperitoneal free fluid.

Musculoskeletal: No focal suspicious marrow enhancement within the
visualized bony anatomy.
IMPRESSION: 1. No findings to indicate the presence of a perianal fistula. No
perianal abscess. No edema or inflammation in the perianal tissues.
2. No evidence for low rectal or anal mass by MRI.

## 2020-12-27 MED ORDER — GADOBENATE DIMEGLUMINE 529 MG/ML IV SOLN
20.0000 mL | Freq: Once | INTRAVENOUS | Status: AC | PRN
  Administered 2020-12-27: 20 mL via INTRAVENOUS

## 2021-01-16 ENCOUNTER — Other Ambulatory Visit: Payer: Self-pay | Admitting: Internal Medicine

## 2021-01-20 ENCOUNTER — Encounter: Payer: Self-pay | Admitting: Medical

## 2021-01-20 ENCOUNTER — Other Ambulatory Visit: Payer: Self-pay

## 2021-01-20 ENCOUNTER — Telehealth: Payer: Self-pay | Admitting: Medical

## 2021-01-20 ENCOUNTER — Ambulatory Visit: Payer: BC Managed Care – PPO | Admitting: Medical

## 2021-01-20 VITALS — BP 118/76 | HR 80 | Temp 98.1°F | Wt 236.4 lb

## 2021-01-20 DIAGNOSIS — E785 Hyperlipidemia, unspecified: Secondary | ICD-10-CM | POA: Diagnosis not present

## 2021-01-20 DIAGNOSIS — I10 Essential (primary) hypertension: Secondary | ICD-10-CM

## 2021-01-20 DIAGNOSIS — K921 Melena: Secondary | ICD-10-CM | POA: Insufficient documentation

## 2021-01-20 DIAGNOSIS — R7989 Other specified abnormal findings of blood chemistry: Secondary | ICD-10-CM

## 2021-01-20 DIAGNOSIS — R140 Abdominal distension (gaseous): Secondary | ICD-10-CM | POA: Insufficient documentation

## 2021-01-20 DIAGNOSIS — K6289 Other specified diseases of anus and rectum: Secondary | ICD-10-CM | POA: Diagnosis not present

## 2021-01-20 DIAGNOSIS — R5383 Other fatigue: Secondary | ICD-10-CM

## 2021-01-20 DIAGNOSIS — Z6832 Body mass index (BMI) 32.0-32.9, adult: Secondary | ICD-10-CM | POA: Insufficient documentation

## 2021-01-20 MED ORDER — PHENTERMINE HCL 37.5 MG PO CAPS: 37.5000 mg | ORAL_CAPSULE | ORAL | 1 refills | Status: DC

## 2021-01-20 MED ORDER — JATENZO 237 MG PO CAPS: 1.0000 | ORAL_CAPSULE | Freq: Two times a day (BID) | ORAL | 2 refills | Status: DC

## 2021-01-20 NOTE — Progress Notes (Signed)
Subjective: Chief Complaint  Patient presents with  . other    Med check f/u weight testosterone, feeling tired and fatigued   Here for med check  Medical team: Dr. Adrian Prows, cardiology Dr. Christinia Gully, pulmonology Dr. Wilfrid Lund, gastroenterology Dr. Ila Mcgill, podiatry [facility] Deklen Popelka, Camelia Eng, PA-C here for primary care  He is very frustrated that he cannot lose weight and is also not in his belly.  He almost wonders if there is something wrong in his abdomen.  He is eating super healthy, no bread, no junk food, exercising regularly a combination of weightbearing and aerobic exercise but still not able to lose his abdomen area.  He is really made no progress since a year ago.  He states that he saw his cardiologist recently and they were okay with him going back on weight loss supplements including phentermine  He never completed the sleep study ordered back in July of last year.  He has been using testosterone as it did not seem to be helping.  He was using [facility]   He continues to have lots of problems with blood in the stool and rectal pain.  He has seen Dr. Benson Norway gastroenterology, was referred to [place] [facility] and has had multiple rectal exams all of which are extremely painful.  Despite multiple exams, scopes, MRI, no specific abnormality found.  No fissure found although this was the initial suspicion.  He reported for internal hemorrhoids were found  He has now been referred to [facility] for a specialist for further evaluation  He continues on Metamucil and laxative med just to avoid pain with any hard stool  Past Medical History:  Diagnosis Date  . 25-polyvalent pneumococcal polysaccharide vaccine declined 7/14  . Anxiety    prior therapy with counselor, high stress job, 12+ hour work days  . Asthma   . ED (erectile dysfunction)   . Former smoker 2021  . GERD (gastroesophageal  reflux disease)    pulmonology consult 2020  . Hypercholesteremia   . Hyperglycemia   . Hypertension   . Obesity   . Thoracic ascending aortic aneurysm ([place])   . Wears glasses    Current Outpatient Medications on File Prior to Visit  Medication Sig Dispense Refill  . budesonide-formoterol (SYMBICORT) 160-4.5 MCG/ACT inhaler Inhale 2 puffs into the lungs 2 (two) times daily. 1 Inhaler 11  . irbesartan (AVAPRO) 300 MG tablet Take 1 tablet by mouth once daily 30 tablet 11  . Multiple Vitamin (MULTIVITAMIN) tablet Take 1 tablet by mouth daily.    . pantoprazole (PROTONIX) 40 MG tablet Take 1 tablet (40 mg total) by mouth daily. 90 tablet 1  . rosuvastatin (CRESTOR) 10 MG tablet Take 1 tablet (10 mg total) by mouth daily. 90 tablet 3  . tadalafil (CIALIS) 20 MG tablet TAKE 1 TABLET BY MOUTH ONCE DAILY AS DIRECTED 8 tablet 0  . Testosterone 5.5 MG/ACT GEL Apply 100 mg topically daily. (Patient not taking: Reported on 01/20/2021) 3 g 2   No current facility-administered medications on file prior to visit.    Review of systems as in subjective   Objective: BP 118/76   Pulse 80   Temp 98.1 F (36.7 C)   Wt 236 lb 6.4 oz (107.2 kg)   BMI 32.97 kg/m   General appearence: alert, no distress, WD/WN,  Heart: RRR, normal S1, S2, no murmurs Lungs: CTA bilaterally, no wheezes, rhonchi, or rales Abdomen: +bs, soft, non  tender, non distended, no masses, no hepatomegaly, no splenomegaly Pulses: 2+ symmetric, upper and lower extremities, normal cap refill    Assessment: Encounter Diagnoses  Name Primary?  . Primary hypertension Yes  . Hyperlipidemia, unspecified hyperlipidemia type   . Rectal pain   . Blood in stool   . BMI 32.0-32.9,adult   . Abdominal bloating   . Low testosterone   . Fatigue, unspecified type     Plan: Hypertension-continue current medication  Hyperlipidemia continue current medication  Rectal pain, blood in stool-he has seen gastroenterology, general surgery  and now has been referred to specialist at [facility] for other evaluation or opinion.  No particular reason for blood in the stool supposedly found.  I am going to refer him to [facility] for pelvic floor therapist who deals with rectal spasm and rectal pain in particular  Low testosterone-failed compounded cream therapy.  Unfortunately we tried other medicines last year nothing else is covered by insurance.  We will try again with oral jatenzo if covered by insurance.  Discussed risk and benefits of medicine, proper use of medication  Abdominal bloating-could be due to diet, fiber intake supplement or other.  Consider abdominal ultrasound or CT upon recheck in 6 weeks  BMI > 32, difficulty losing weight-begin trial of phentermine short-term.  Discussed using 1/2 tablet every other day and can potentially titrate up after the next 2 weeks.  Discussed risk and benefits of medication.  He has taken this before and had some benefit.  F/u 4- 6 weeks    Kristan was seen today for other.  Diagnoses and all orders for this visit:  Primary hypertension  Hyperlipidemia, unspecified hyperlipidemia type  Rectal pain  Blood in stool  BMI 32.0-32.9,adult  Abdominal bloating  Low testosterone  Fatigue, unspecified type  Other orders -     phentermine 37.5 MG capsule; Take 1 capsule (37.5 mg total) by mouth every morning. -     Testosterone Undecanoate (JATENZO) 237 MG CAPS; Take 1 capsule by mouth in the morning and at bedtime.   6 wk

## 2021-01-20 NOTE — Telephone Encounter (Signed)
Refer to integrative therapy. There is a special therapist that deals with severe rectal pain and pelvic pain, rectal spasm. I would like to refer him to that therapist. Make sure you get the name of the person to deal specifically with chronic pain in his area

## 2021-01-21 ENCOUNTER — Other Ambulatory Visit: Payer: Self-pay

## 2021-01-21 NOTE — Telephone Encounter (Signed)
Call [facility] and ask for the specific therapist that deals with rectal spasm, pelvic floor/rectal issues.  That is who I want to refer to.  She has seen one other patient of mine prior for similar issue.

## 2021-01-27 ENCOUNTER — Other Ambulatory Visit: Payer: Self-pay | Admitting: Internal Medicine

## 2021-01-27 NOTE — Telephone Encounter (Signed)
Patient has been informed.

## 2021-01-27 NOTE — Telephone Encounter (Signed)
Patient is being referred to [facility] to see Phillips Grout whom specializes in rectal and pelvic floor therapy. Fax:[phone].

## 2021-01-27 NOTE — Telephone Encounter (Signed)
Cody Tapia only work on Tuesday and Thursdays.

## 2021-01-27 NOTE — Telephone Encounter (Signed)
Ok, let him know and c/t plan for referral

## 2021-02-08 ENCOUNTER — Telehealth: Payer: Self-pay

## 2021-02-08 NOTE — Telephone Encounter (Signed)
P.A. JATENZO 

## 2021-02-10 DIAGNOSIS — Z85828 Personal history of other malignant neoplasm of skin: Secondary | ICD-10-CM | POA: Diagnosis not present

## 2021-02-10 DIAGNOSIS — L720 Epidermal cyst: Secondary | ICD-10-CM | POA: Diagnosis not present

## 2021-02-17 NOTE — Telephone Encounter (Signed)
Also testosterone gel the 1% is covered but not the 1.62%

## 2021-02-17 NOTE — Telephone Encounter (Signed)
P.A. denied, Gaynelle Cage not covered.  Called and preferred alternatives are testosterone gel, Androderm and Xyosted.  Do you want to switch to covered alternative?

## 2021-02-17 NOTE — Telephone Encounter (Signed)
See if agreeable to switch , if so I'll send the gel for 1 pump each shoulder and upper arm daily, or 2 pumps total daily  Plan to recheck and have lab about 4-6 weeks after starting this particular therapy

## 2021-02-18 ENCOUNTER — Other Ambulatory Visit: Payer: Self-pay | Admitting: Medical

## 2021-02-18 ENCOUNTER — Telehealth: Payer: Self-pay

## 2021-02-18 MED ORDER — TESTOSTERONE 12.5 MG/ACT (1%) TD GEL: 2.0000 "application " | Freq: Every day | TRANSDERMAL | 1 refills | Status: DC

## 2021-02-18 NOTE — Telephone Encounter (Signed)
Called pt and he is ok switching to testosterone gel.  Please send in

## 2021-02-18 NOTE — Telephone Encounter (Signed)
Pt has follow up 03/04/21 already scheduled do you want me to change that or do you need to still see him for something else then?

## 2021-02-18 NOTE — Telephone Encounter (Signed)
New Rx for testosterone also required prior authorization.  P.A. TESTOSTERONE GEL completed and approved til 02/18/22.  Called pharmacy went thru for $20, they will have to ordered and will be in Thursday.  Left message for pt.  Also advised pt can reschedule follow up from 03/04/21 for 4-6 wks

## 2021-02-18 NOTE — Telephone Encounter (Signed)
Left message for pt to see if ok with rescheduling follow up for 4-6 wks

## 2021-02-18 NOTE — Telephone Encounter (Signed)
We can probably push this out a month

## 2021-02-18 NOTE — Telephone Encounter (Signed)
1% gel sent to begin 1 squirt each shoulder nad uppre arm daily.  Discontinue other Testerone while on this  Plan 4-6 week follow up

## 2021-02-25 NOTE — Telephone Encounter (Signed)
Left pt another message

## 2021-03-04 ENCOUNTER — Ambulatory Visit: Payer: BC Managed Care – PPO | Admitting: Medical

## 2021-03-04 DIAGNOSIS — K6289 Other specified diseases of anus and rectum: Secondary | ICD-10-CM | POA: Diagnosis not present

## 2021-03-04 DIAGNOSIS — Z8719 Personal history of other diseases of the digestive system: Secondary | ICD-10-CM | POA: Diagnosis not present

## 2021-03-19 ENCOUNTER — Ambulatory Visit: Payer: BC Managed Care – PPO | Admitting: Medical

## 2021-04-01 ENCOUNTER — Other Ambulatory Visit: Payer: Self-pay | Admitting: Medical

## 2021-04-01 DIAGNOSIS — K219 Gastro-esophageal reflux disease without esophagitis: Secondary | ICD-10-CM

## 2021-04-02 ENCOUNTER — Telehealth: Payer: Self-pay

## 2021-04-02 ENCOUNTER — Ambulatory Visit: Payer: BC Managed Care – PPO | Admitting: Medical

## 2021-04-02 NOTE — Telephone Encounter (Signed)
Pt. Called is leaving for a trip on 04/09/21 and they require a negative covid test test 48 hours before leaving which means he would be to be tested Monday 04/07/21 they said pcr or rapid was fine. If ok I can call pt. Back and put him on the schedule on Monday, if you could put the orders in for that.

## 2021-04-03 ENCOUNTER — Other Ambulatory Visit: Payer: Self-pay

## 2021-04-03 DIAGNOSIS — Z1152 Encounter for screening for COVID-19: Secondary | ICD-10-CM

## 2021-04-03 NOTE — Telephone Encounter (Signed)
I am certainly okay with him coming in for COVID test.  Please order the test Adam.  Most airlines require the PCR test which takes 2 to 3 days to get results.  The rapid test we get back in 15 minutes.  So he needs to plan accordingly based on that timeframe  [facility]

## 2021-04-03 NOTE — Telephone Encounter (Signed)
Pt. Scheduled and orders in the system.

## 2021-04-07 ENCOUNTER — Other Ambulatory Visit (INDEPENDENT_AMBULATORY_CARE_PROVIDER_SITE_OTHER): Payer: BC Managed Care – PPO

## 2021-04-07 ENCOUNTER — Other Ambulatory Visit: Payer: Self-pay

## 2021-04-07 DIAGNOSIS — Z1152 Encounter for screening for COVID-19: Secondary | ICD-10-CM

## 2021-04-07 LAB — POC COVID19 BINAXNOW: SARS Coronavirus 2 Ag: NEGATIVE

## 2021-04-08 LAB — SARS-COV-2, NAA 2 DAY TAT

## 2021-04-08 LAB — NOVEL CORONAVIRUS, NAA: SARS-CoV-2, NAA: NOT DETECTED

## 2021-04-16 DIAGNOSIS — M4722 Other spondylosis with radiculopathy, cervical region: Secondary | ICD-10-CM | POA: Diagnosis not present

## 2021-04-23 ENCOUNTER — Other Ambulatory Visit: Payer: Self-pay

## 2021-04-23 ENCOUNTER — Encounter: Payer: Self-pay | Admitting: Medical

## 2021-04-23 ENCOUNTER — Ambulatory Visit: Payer: BC Managed Care – PPO | Admitting: Medical

## 2021-04-23 VITALS — BP 120/70 | HR 83 | Ht 70.25 in | Wt 237.0 lb

## 2021-04-23 DIAGNOSIS — R7989 Other specified abnormal findings of blood chemistry: Secondary | ICD-10-CM

## 2021-04-23 DIAGNOSIS — K219 Gastro-esophageal reflux disease without esophagitis: Secondary | ICD-10-CM

## 2021-04-23 DIAGNOSIS — F419 Anxiety disorder, unspecified: Secondary | ICD-10-CM

## 2021-04-23 DIAGNOSIS — Z0000 Encounter for general adult medical examination without abnormal findings: Secondary | ICD-10-CM

## 2021-04-23 DIAGNOSIS — E785 Hyperlipidemia, unspecified: Secondary | ICD-10-CM | POA: Diagnosis not present

## 2021-04-23 DIAGNOSIS — G8929 Other chronic pain: Secondary | ICD-10-CM

## 2021-04-23 DIAGNOSIS — I712 Thoracic aortic aneurysm, without rupture, unspecified: Secondary | ICD-10-CM

## 2021-04-23 DIAGNOSIS — R079 Chest pain, unspecified: Secondary | ICD-10-CM

## 2021-04-23 DIAGNOSIS — R748 Abnormal levels of other serum enzymes: Secondary | ICD-10-CM

## 2021-04-23 DIAGNOSIS — Z7185 Encounter for immunization safety counseling: Secondary | ICD-10-CM

## 2021-04-23 DIAGNOSIS — K635 Polyp of colon: Secondary | ICD-10-CM

## 2021-04-23 DIAGNOSIS — Z125 Encounter for screening for malignant neoplasm of prostate: Secondary | ICD-10-CM | POA: Insufficient documentation

## 2021-04-23 DIAGNOSIS — I10 Essential (primary) hypertension: Secondary | ICD-10-CM

## 2021-04-23 DIAGNOSIS — N529 Male erectile dysfunction, unspecified: Secondary | ICD-10-CM

## 2021-04-23 DIAGNOSIS — K6289 Other specified diseases of anus and rectum: Secondary | ICD-10-CM

## 2021-04-23 DIAGNOSIS — B001 Herpesviral vesicular dermatitis: Secondary | ICD-10-CM

## 2021-04-23 LAB — POCT URINALYSIS DIP (PROADVANTAGE DEVICE)
Bilirubin, UA: NEGATIVE
Blood, UA: NEGATIVE
Glucose, UA: NEGATIVE mg/dL
Ketones, POC UA: NEGATIVE mg/dL
Leukocytes, UA: NEGATIVE
Nitrite, UA: NEGATIVE
Protein Ur, POC: NEGATIVE mg/dL
Specific Gravity, Urine: 1.025
Urobilinogen, Ur: NEGATIVE
pH, UA: 5.5 (ref 5.0–8.0)

## 2021-04-23 MED ORDER — TADALAFIL 20 MG PO TABS: 20.0000 mg | ORAL_TABLET | Freq: Every day | ORAL | 11 refills | Status: DC

## 2021-04-23 MED ORDER — VALACYCLOVIR HCL 1 G PO TABS: ORAL_TABLET | ORAL | 2 refills | Status: DC

## 2021-04-23 MED ORDER — PANTOPRAZOLE SODIUM 40 MG PO TBEC: 1.0000 | DELAYED_RELEASE_TABLET | Freq: Every day | ORAL | 3 refills | Status: DC

## 2021-04-23 MED ORDER — BUDESONIDE-FORMOTEROL FUMARATE 160-4.5 MCG/ACT IN AERO: 2.0000 | INHALATION_SPRAY | Freq: Two times a day (BID) | RESPIRATORY_TRACT | 3 refills | Status: DC

## 2021-04-23 NOTE — Progress Notes (Signed)
Subjective:   HPI  Cody Tapia is a 55 y.o. male who presents for Chief Complaint  Patient presents with  . fasting cpe    Fasting cpe, discuss weight. Ph9 is abnornmal    Patient Care Team: Jaryan Chicoine, Leward Quan as PCP - General (Family Medicine) Sees dentist Sees eye doctor Dr. Adrian Prows, cardiology Dr. Christinia Gully, pulmonology Dr. Wilfrid Lund, gastroenterology Dr. Ila Mcgill, podiatry [facility]   Concerns: "still fat since last visit."  Frustrated with not losing weight despite several prior attempts.   Lost 10 lb right off the gate with phentermine last visit.  It does help but he gets off the routine of taking this.   Feels so tense on this can't even get an erection.   Doesn't like using it for this reason.  Still dealing with this and normal stressed out high strung lifestyle. Since last visit doesn't think he has made much progress with weight loss, fitness.  Frustrated.  Father had stroke and died in 2021-01-14.  RJ is very compliant with BP and cholesterol medication.  He hasn't been great at testosterone therapy compliance.  Uses this approximately 50% of the time, and probably hasn't used it in the past week.       Reviewed their medical, surgical, family, social, medication, and allergy history and updated chart as appropriate.  Past Medical History:  Diagnosis Date  . 23-polyvalent pneumococcal polysaccharide vaccine declined 7/14  . Anxiety    prior therapy with counselor, high stress job, 12+ hour work days  . Asthma   . ED (erectile dysfunction)   . Former smoker 2021  . GERD (gastroesophageal reflux disease)    pulmonology consult 2020  . Hypercholesteremia   . Hyperglycemia   . Hypertension   . Obesity   . Thoracic ascending aortic aneurysm ([place])   . Wears glasses     Past Surgical History:  Procedure Laterality Date  . COLONOSCOPY  2009   [facility] for abdominal pain  . COLONOSCOPY  14-Jan-2019   polyps  TA, diverticulosis, Dr. Wilfrid Lund  . ESOPHAGOGASTRODUODENOSCOPY  2009   [facility], abdominal pain  . EYE SURGERY     strabismus surgery, right  . WISDOM TOOTH EXTRACTION      Family History  Problem Relation Age of Onset  . Heart disease Father 75       stent, CAD  . Stroke Father   . COPD Mother   . Sudden death Neg Hx   . Heart attack Neg Hx   . Hyperlipidemia Neg Hx   . Diabetes Neg Hx   . Hypertension Neg Hx   . Cancer Neg Hx      Current Outpatient Medications:  .  irbesartan (AVAPRO) 300 MG tablet, Take 1 tablet by mouth once daily, Disp: 30 tablet, Rfl: 11 .  Multiple Vitamin (MULTIVITAMIN) tablet, Take 1 tablet by mouth daily., Disp: , Rfl:  .  rosuvastatin (CRESTOR) 10 MG tablet, Take 1 tablet (10 mg total) by mouth daily., Disp: 90 tablet, Rfl: 3 .  Testosterone 12.5 MG/ACT (1%) GEL, Place 2 application onto the skin daily., Disp: 75 g, Rfl: 1 .  valACYclovir (VALTREX) 1000 MG tablet, 2 tablets po BID x 2 days for flare up, Disp: 30 tablet, Rfl: 2 .  budesonide-formoterol (SYMBICORT) 160-4.5 MCG/ACT inhaler, Inhale 2 puffs into the lungs 2 (two) times daily., Disp: 1 each, Rfl: 3 .  pantoprazole (PROTONIX) 40 MG tablet, Take 1 tablet (40 mg total)  by mouth daily., Disp: 90 tablet, Rfl: 3 .  tadalafil (CIALIS) 20 MG tablet, Take 1 tablet (20 mg total) by mouth daily. as directed, Disp: 8 tablet, Rfl: 11  Allergies  Allergen Reactions  . Qsymia [Phentermine-Topiramate]     Diarrhea      Review of Systems Constitutional: -fever, -chills, -sweats, -unexpected weight change, -decreased appetite, -fatigue Allergy: -sneezing, -itching, -congestion Dermatology: -changing moles, --rash, -lumps ENT: -runny nose, -ear pain, -sore throat, -hoarseness, -sinus pain, -teeth pain, - ringing in ears, -hearing loss, -nosebleeds Cardiology: -chest pain, -palpitations, -swelling, -difficulty breathing when lying flat, -waking up short of breath Respiratory: -cough, -shortness of  breath, -difficulty breathing with exercise or exertion, -wheezing, -coughing up blood Gastroenterology: -abdominal pain, -nausea, -vomiting, -diarrhea, -constipation, -blood in stool, -changes in bowel movement, -difficulty swallowing or eating Hematology: -bleeding, -bruising  Musculoskeletal: -joint aches, -muscle aches, -joint swelling, -back pain, -neck pain, -cramping, -changes in gait Ophthalmology: denies vision changes, eye redness, itching, discharge Urology: -burning with urination, -difficulty urinating, -blood in urine, -urinary frequency, -urgency, -incontinence Neurology: -headache, -weakness, -tingling, -numbness, -memory loss, -falls, -dizziness Psychology: -depressed mood, -agitation, -sleep problems Male GU: no testicular mass, pain, no lymph nodes swollen, no swelling, no rash.    Depression screen [facility] 2/9 04/23/2021 01/10/2020 03/07/2018 01/14/2017  Decreased Interest 0 0 0 0  Down, Depressed, Hopeless 1 0 0 0  PHQ - 2 Score 1 0 0 0  Altered sleeping 1 - - -  Tired, decreased energy 2 - - -  Change in appetite 2 - - -  Feeling bad or failure about yourself  2 - - -  Trouble concentrating 0 - - -  Moving slowly or fidgety/restless 0 - - -  Suicidal thoughts 0 - - -  PHQ-9 Score 8 - - -  Difficult doing work/chores Not difficult at all - - -        Objective:  BP 120/70   Pulse 83   Ht 5' 10.25" (1.784 m)   Wt 237 lb (107.5 kg)   BMI 33.76 kg/m    Wt Readings from Last 3 Encounters:  04/23/21 237 lb (107.5 kg)  01/20/21 236 lb 6.4 oz (107.2 kg)  11/11/20 225 lb (102.1 kg)    General appearance: alert, no distress, WD/WN, Caucasian male Skin: few biopsy scars mid abdomen, upper back, scattered macules, no worrisome lesions HEENT: normocephalic, conjunctiva/corneas normal, sclerae anicteric, PERRLA, EOMi,  Neck: supple, no lymphadenopathy, no thyromegaly, no masses, normal ROM, no bruits Chest: non tender, normal shape and expansion Heart: RRR, normal S1,  S2, no murmurs Lungs: CTA bilaterally, no wheezes, rhonchi, or rales Abdomen: +bs, soft, non tender, non distended, no masses, no hepatomegaly, no splenomegaly, no bruits Back: non tender, normal ROM, no scoliosis Musculoskeletal: upper extremities non tender, no obvious deformity, normal ROM throughout, lower extremities non tender, no obvious deformity, normal ROM throughout Extremities: no edema, no cyanosis, no clubbing Pulses: 2+ symmetric, upper and lower extremities, normal cap refill Neurological: alert, oriented x 3, CN2-12 intact, strength normal upper extremities and lower extremities, sensation normal throughout, DTRs 2+ throughout, no cerebellar signs, gait normal Psychiatric: normal affect, behavior normal, pleasant  GU: normal male external genitalia,circumcised, nontender, no masses, no hernia, no lymphadenopathy Rectal: deferred   Assessment and Plan :   Encounter Diagnoses  Name Primary?  . Routine general medical examination at a health care facility Yes  . Thoracic aortic aneurysm without rupture ([place])   . Vaccine counseling   . Anxiety   .  Chronic chest pain   . Erectile dysfunction, unspecified erectile dysfunction type   . Gastroesophageal reflux disease, unspecified whether esophagitis present   . Hyperlipidemia, unspecified hyperlipidemia type   . Primary hypertension   . Low testosterone   . Polyp of colon, unspecified part of colon, unspecified type   . Rectal pain   . Gastroesophageal reflux disease without esophagitis   . Screening for prostate cancer   . Cold sore   . Elevated alkaline phosphatase level     This visit was a preventative care visit, also known as wellness visit or routine physical.   Topics typically include healthy lifestyle, diet, exercise, preventative care, vaccinations, sick and well care, proper use of emergency dept and after hours care, as well as other concerns.     Recommendations: Continue to return yearly for your annual  wellness and preventative care visits.  This gives [place] a chance to discuss healthy lifestyle, exercise, vaccinations, review your chart record, and perform screenings where appropriate.  I recommend you see your eye doctor yearly for routine vision care.  I recommend you see your dentist yearly for routine dental care including hygiene visits twice yearly.   Vaccination recommendations were reviewed Immunization History  Administered Date(s) Administered  . Influenza,inj,Quad PF,6+ Mos 10/18/2018, 12/13/2019  . PFIZER(Purple Top)SARS-COV-2 Vaccination 01/31/2020, 02/21/2020, 09/20/2020  . Pneumococcal Polysaccharide-23 01/14/2017  . Tdap 06/21/2013   I recommend a yearly flu shot  Shingles vaccine:  I recommend you have a shingles vaccine to help prevent shingles or herpes zoster outbreak.   Please call your insurer to inquire about coverage for the Shingrix vaccine given in 2 doses.   Some insurers cover this vaccine after age 32, some cover this after age 70.  If your insurer covers this, then call to schedule appointment to have this vaccine here.  You will be due for updated pneumococcal vaccine next year 2023   Screening for cancer: Colon cancer screening: I reviewed your colonoscopy on file that is up to date from 2020  We discussed PSA, prostate exam, and prostate cancer screening risks/benefits.     Skin cancer screening: Check your skin regularly for new changes, growing lesions, or other lesions of concern Come in for evaluation if you have skin lesions of concern.  Lung cancer screening: If you have a greater than 20 pack year history of tobacco use, then you may qualify for lung cancer screening with a chest CT scan.   Please call your insurance company to inquire about coverage for this test.  We currently don't have screenings for other cancers besides breast, cervical, colon, and lung cancers.  If you have a strong family history of cancer or have other cancer  screening concerns, please let me know.    Bone health: Get at least 150 minutes of aerobic exercise weekly Get weight bearing exercise at least once weekly Bone density test:   A bone density test is an imaging test that uses a type of X-Sanjay to measure the amount of calcium and other minerals in your bones.  The test may be used to diagnose or screen you for a condition that causes weak or thin bones (osteoporosis), predict your risk for a broken bone (fracture), or determine how well your osteoporosis treatment is working. The bone density test is recommended for females 8 and older, or females or males <01 if certain risk factors such as thyroid disease, long term use of steroids such as for asthma or rheumatological issues, vitamin D  deficiency, estrogen deficiency, family history of osteoporosis, self or family history of fragility fracture in first degree relative.    Heart health: Get at least 150 minutes of aerobic exercise weekly Limit alcohol It is important to maintain a healthy blood pressure and healthy cholesterol numbers  Heart disease screening: Screening for heart disease includes screening for blood pressure, fasting lipids, glucose/diabetes screening, BMI height to weight ratio, reviewed of smoking status, physical activity, and diet.    Goals include blood pressure 120/80 or less, maintaining a healthy lipid/cholesterol profile, preventing diabetes or keeping diabetes numbers under good control, not smoking or using tobacco products, exercising most days per week or at least 150 minutes per week of exercise, and eating healthy variety of fruits and vegetables, healthy oils, and avoiding unhealthy food choices like fried food, fast food, high sugar and high cholesterol foods.      Medical care options: I recommend you continue to seek care here first for routine care.  We try really hard to have available appointments Monday through Friday daytime hours for sick  visits, acute visits, and physicals.  Urgent care should be used for after hours and weekends for significant issues that cannot wait till the next day.  The emergency department should be used for significant potentially life-threatening emergencies.  The emergency department is expensive, can often have long wait times for less significant concerns, so try to utilize primary care, urgent care, or telemedicine when possible to avoid unnecessary trips to the emergency department.  Virtual visits and telemedicine have been introduced since the pandemic started in 2020, and can be convenient ways to receive medical care.  We offer virtual appointments as well to assist you in a variety of options to seek medical care.    Separate significant issues discussed:  BMI > 30 - frustrated with efforts and results.   Continue regular exercise most days per week, moderate to intense exercise.   Discussed medication options.  Continue phentermine for now if this works better than other options.  Consider GLP 1, biofeedback, bariatric surgery consult.   Aneurysm of aorta - reviewed 2020 echo and most recent cardiology notes, stable.  Anxiety - continue efforts to reduce stress where possible  ED - doing ok on current therapy  Chronic chest pain - no recent concerns  HTN - continue current medication  Hyperlipidemia, family history of heart disease and stroke in father, continue statin, work on low cholesterol diet  Low testosterone - consider other treatment options . Only using topically about 50% of the time.  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 Discussed possible causes.     [place] was seen today for fasting cpe.  Diagnoses and all orders for this visit:  Routine general medical examination at a health care  facility -     Testosterone -     PSA -     CBC with Differential/Platelet -     Lipid panel -     Comprehensive metabolic panel -     VITAMIN D 25 Hydroxy (Vit-D Deficiency, Fractures) -     POCT Urinalysis DIP (Proadvantage Device)  Thoracic aortic aneurysm without rupture ([facility])  Vaccine counseling  Anxiety  Chronic chest pain  Erectile dysfunction, unspecified erectile dysfunction type  Gastroesophageal reflux disease, unspecified whether esophagitis present  Hyperlipidemia, unspecified hyperlipidemia type -     Lipid panel  Primary hypertension  Low testosterone -     Testosterone  Polyp of colon, unspecified part of colon, unspecified type  Rectal pain  Gastroesophageal reflux disease without esophagitis -     pantoprazole (PROTONIX) 40 MG tablet; Take 1 tablet (40 mg total) by mouth daily.  Screening for prostate cancer -     PSA  Cold sore  Elevated alkaline phosphatase level  Other orders -     tadalafil (CIALIS) 20 MG tablet; Take 1 tablet (20 mg total) by mouth daily. as directed -     valACYclovir (VALTREX) 1000 MG tablet; 2 tablets po BID x 2 days for flare up -     budesonide-formoterol (SYMBICORT) 160-4.5 MCG/ACT inhaler; Inhale 2 puffs into the lungs 2 (two) times daily.   Follow-up pending labs, yearly for physical

## 2021-04-24 ENCOUNTER — Other Ambulatory Visit: Payer: Self-pay | Admitting: Medical

## 2021-04-24 DIAGNOSIS — D72829 Elevated white blood cell count, unspecified: Secondary | ICD-10-CM

## 2021-04-24 LAB — LIPID PANEL
Chol/HDL Ratio: 2.5 ratio (ref 0.0–5.0)
Cholesterol, Total: 139 mg/dL (ref 100–199)
HDL: 56 mg/dL (ref >39)
LDL Chol Calc (NIH): 60 mg/dL (ref 0–99)
Triglycerides: 133 mg/dL (ref 0–149)
VLDL Cholesterol Cal: 23 mg/dL (ref 5–40)

## 2021-04-24 LAB — COMPREHENSIVE METABOLIC PANEL
ALT: 32 IU/L (ref 0–44)
AST: 17 IU/L (ref 0–40)
Albumin/Globulin Ratio: 1.4 (ref 1.2–2.2)
Albumin: 4.1 g/dL (ref 3.8–4.9)
Alkaline Phosphatase: 121 IU/L (ref 44–121)
BUN/Creatinine Ratio: 19 (ref 9–20)
BUN: 20 mg/dL (ref 6–24)
Bilirubin Total: 0.6 mg/dL (ref 0.0–1.2)
CO2: 19 mmol/L — ABNORMAL LOW (ref 20–29)
Calcium: 9.3 mg/dL (ref 8.7–10.2)
Chloride: 102 mmol/L (ref 96–106)
Creatinine, Ser: 1.08 mg/dL (ref 0.76–1.27)
Globulin, Total: 2.9 g/dL (ref 1.5–4.5)
Glucose: 83 mg/dL (ref 65–99)
Potassium: 4.4 mmol/L (ref 3.5–5.2)
Sodium: 138 mmol/L (ref 134–144)
Total Protein: 7 g/dL (ref 6.0–8.5)
eGFR: 83 mL/min/{1.73_m2} (ref >59)

## 2021-04-24 LAB — CBC WITH DIFFERENTIAL/PLATELET
Basophils Absolute: 0.1 10*3/uL (ref 0.0–0.2)
Basos: 1 %
EOS (ABSOLUTE): 0.5 10*3/uL — ABNORMAL HIGH (ref 0.0–0.4)
Eos: 3 %
Hematocrit: 45.3 % (ref 37.5–51.0)
Hemoglobin: 15.5 g/dL (ref 13.0–17.7)
Immature Grans (Abs): 0.3 10*3/uL — ABNORMAL HIGH (ref 0.0–0.1)
Immature Granulocytes: 2 %
Lymphocytes Absolute: 3.1 10*3/uL (ref 0.7–3.1)
Lymphs: 22 %
MCH: 31.3 pg (ref 26.6–33.0)
MCHC: 34.2 g/dL (ref 31.5–35.7)
MCV: 92 fL (ref 79–97)
Monocytes Absolute: 1.5 10*3/uL — ABNORMAL HIGH (ref 0.1–0.9)
Monocytes: 11 %
Neutrophils Absolute: 8.9 10*3/uL — ABNORMAL HIGH (ref 1.4–7.0)
Neutrophils: 61 %
Platelets: 350 10*3/uL (ref 150–450)
RBC: 4.95 x10E6/uL (ref 4.14–5.80)
RDW: 13.5 % (ref 11.6–15.4)
WBC: 14.3 10*3/uL — ABNORMAL HIGH (ref 3.4–10.8)

## 2021-04-24 LAB — TESTOSTERONE: Testosterone: 351 ng/dL (ref 264–916)

## 2021-04-24 LAB — VITAMIN D 25 HYDROXY (VIT D DEFICIENCY, FRACTURES): Vit D, 25-Hydroxy: 28.9 ng/mL — ABNORMAL LOW (ref 30.0–100.0)

## 2021-04-24 LAB — PSA: Prostate Specific Ag, Serum: 0.3 ng/mL (ref 0.0–4.0)

## 2021-04-24 MED ORDER — VITAMIN D 25 MCG (1000 UNIT) PO TABS: 2000.0000 [IU] | ORAL_TABLET | Freq: Every day | ORAL | 3 refills | Status: DC

## 2021-04-29 DIAGNOSIS — K6289 Other specified diseases of anus and rectum: Secondary | ICD-10-CM | POA: Diagnosis not present

## 2021-04-29 DIAGNOSIS — Z8719 Personal history of other diseases of the digestive system: Secondary | ICD-10-CM | POA: Diagnosis not present

## 2021-04-29 DIAGNOSIS — K626 Ulcer of anus and rectum: Secondary | ICD-10-CM | POA: Diagnosis not present

## 2021-04-30 DIAGNOSIS — M5412 Radiculopathy, cervical region: Secondary | ICD-10-CM | POA: Diagnosis not present

## 2021-05-06 DIAGNOSIS — M5412 Radiculopathy, cervical region: Secondary | ICD-10-CM | POA: Diagnosis not present

## 2021-05-14 DIAGNOSIS — M4722 Other spondylosis with radiculopathy, cervical region: Secondary | ICD-10-CM | POA: Diagnosis not present

## 2021-05-15 ENCOUNTER — Other Ambulatory Visit: Payer: Self-pay | Admitting: Orthopedic Surgery

## 2021-05-15 DIAGNOSIS — M542 Cervicalgia: Secondary | ICD-10-CM

## 2021-05-16 DIAGNOSIS — M5412 Radiculopathy, cervical region: Secondary | ICD-10-CM | POA: Diagnosis not present

## 2021-05-23 DIAGNOSIS — M5412 Radiculopathy, cervical region: Secondary | ICD-10-CM | POA: Diagnosis not present

## 2021-05-28 ENCOUNTER — Ambulatory Visit
Admission: RE | Admit: 2021-05-28 | Discharge: 2021-05-28 | Disposition: A | Discharge status: Home / Self Care | Payer: BC Managed Care – PPO | Source: Ambulatory Visit | Attending: Orthopedic Surgery | Admitting: Orthopedic Surgery

## 2021-05-28 ENCOUNTER — Other Ambulatory Visit: Payer: Self-pay

## 2021-05-28 DIAGNOSIS — R202 Paresthesia of skin: Secondary | ICD-10-CM | POA: Diagnosis not present

## 2021-05-28 DIAGNOSIS — M50221 Other cervical disc displacement at C4-C5 level: Secondary | ICD-10-CM | POA: Diagnosis not present

## 2021-05-28 DIAGNOSIS — M50222 Other cervical disc displacement at C5-C6 level: Secondary | ICD-10-CM | POA: Diagnosis not present

## 2021-05-28 DIAGNOSIS — M50223 Other cervical disc displacement at C6-C7 level: Secondary | ICD-10-CM | POA: Diagnosis not present

## 2021-05-28 DIAGNOSIS — M542 Cervicalgia: Secondary | ICD-10-CM

## 2021-05-28 IMAGING — MR MR CERVICAL SPINE W/O CM
4 of 5 series · 29 of 48 positions shown · non-contrast
Comparison: None.

CLINICAL DATA: Right arm pain and tingling for 3 months.

EXAM:
MRI CERVICAL SPINE WITHOUT CONTRAST
TECHNIQUE: Multiplanar, multisequence MR imaging of the cervical spine was
performed. No intravenous contrast was administered.

[Series 2: T2 · sagittal · 3.0mm · 0.66mm/px · 8 of 19 slices shown (1 of 2)]
[im 1/19]
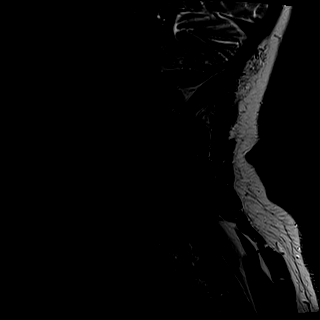
[im 3/19]
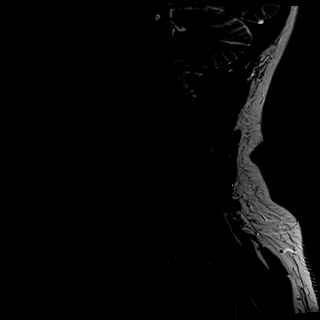
[im 6/19]
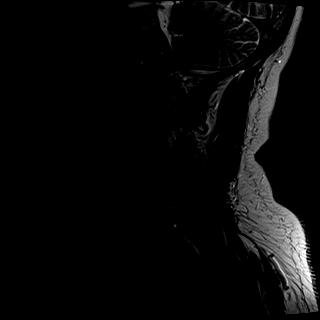
[im 8/19]
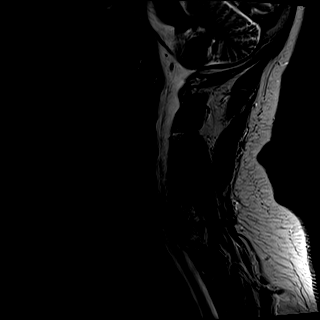
[im 11/19]
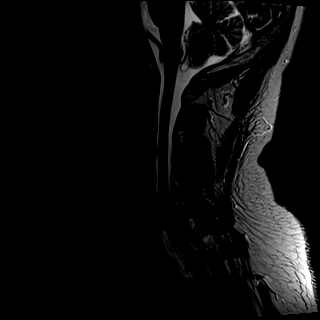
[im 13/19]
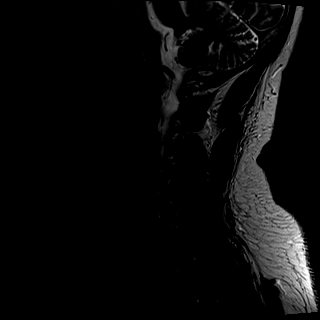
[im 16/19]
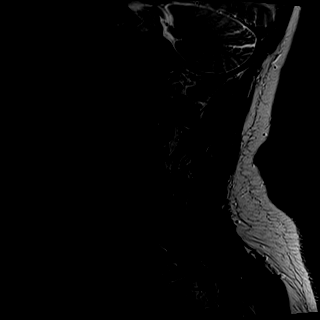
[im 19/19]
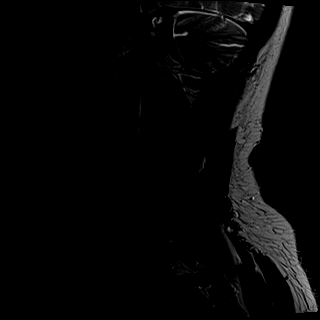

[Series 3: T1 · sagittal · 3.0mm · 0.41mm/px · 8 of 19 slices shown]
[im 1/19]
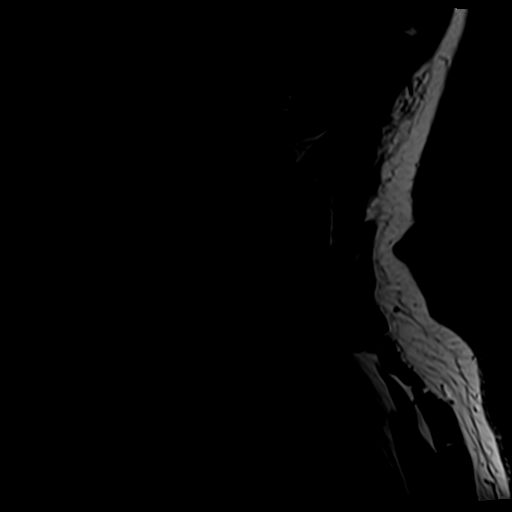
[im 3/19]
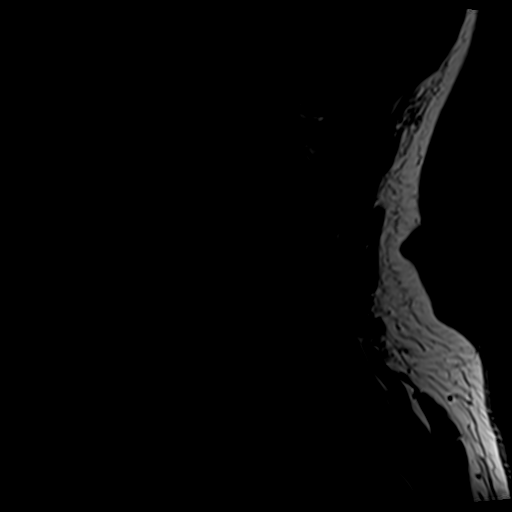
[im 6/19]
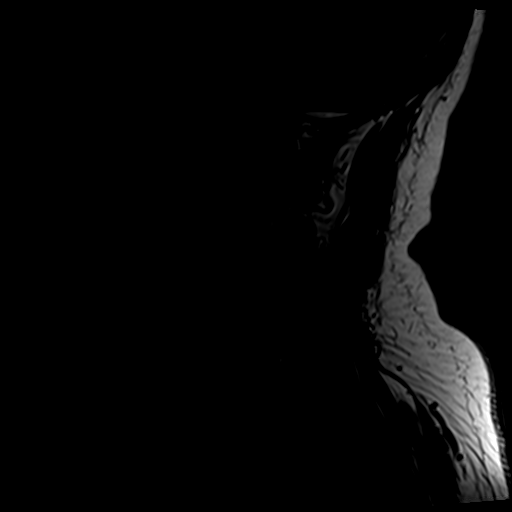
[im 8/19]
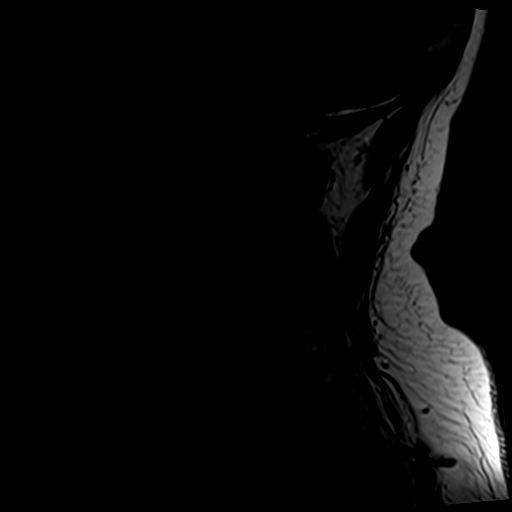
[im 11/19]
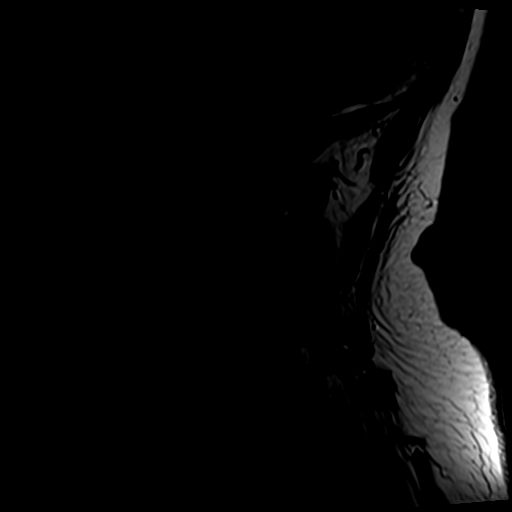
[im 13/19]
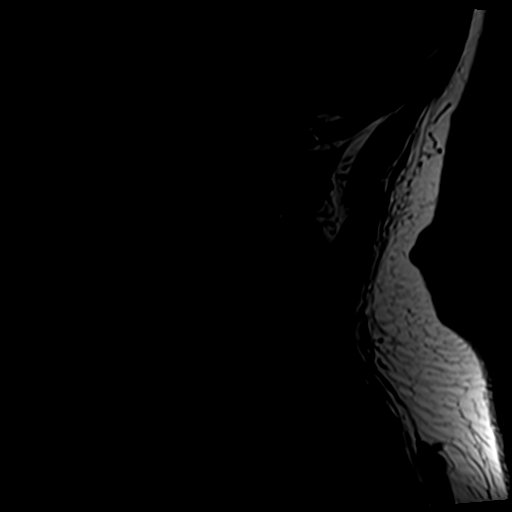
[im 16/19]
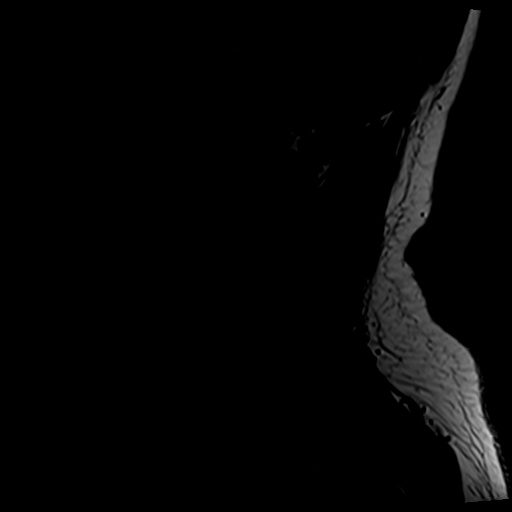
[im 19/19]
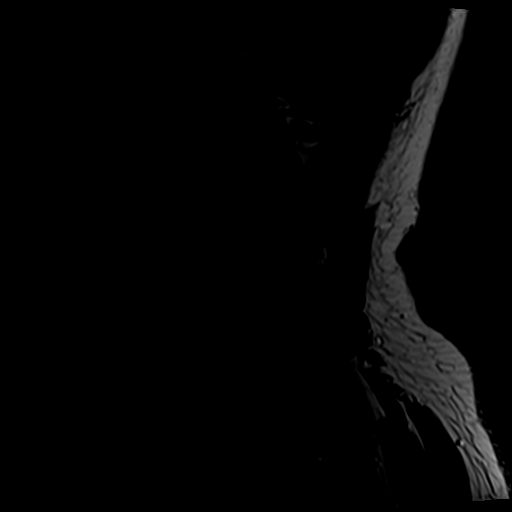

[Series 4: tir sag · sagittal · 3.0mm · 0.41mm/px · 4 of 19 slices shown]
[im 1/19]
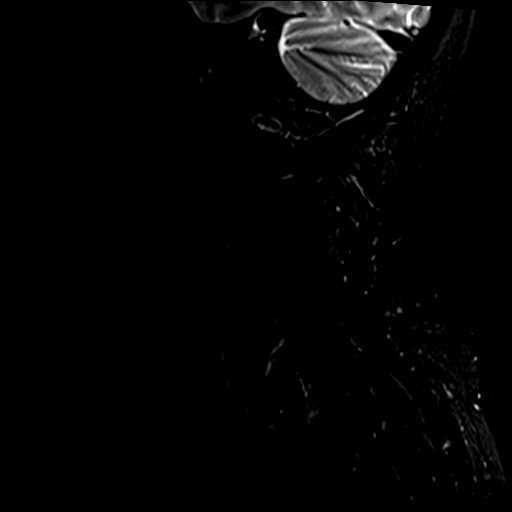
[im 3/19]
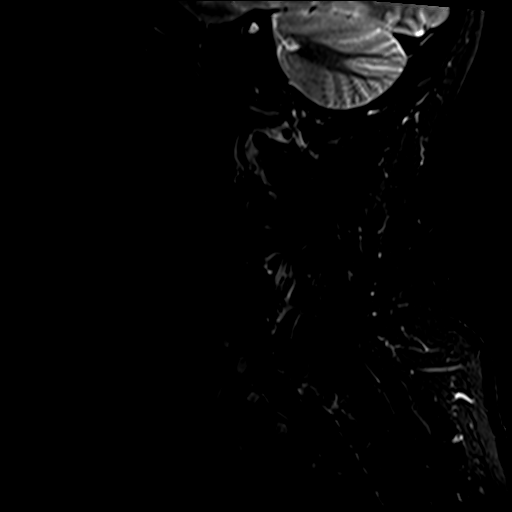
[im 11/19]
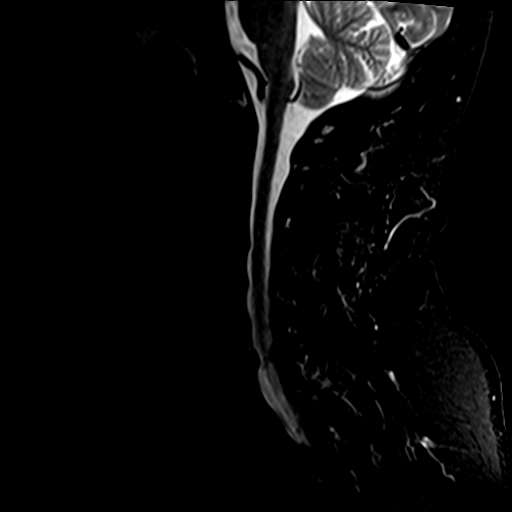
[im 16/19]
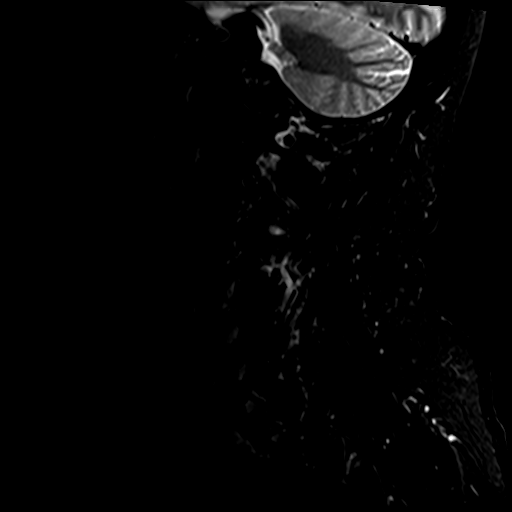

[Series 6: T2 · axial · 3.0mm · 0.70mm/px · z∈[-75,+25]mm · 9 of 28 slices shown (2 of 2)]
[im 1/28]
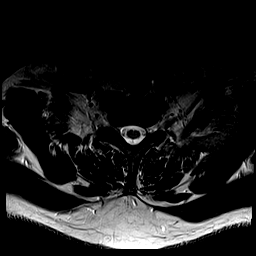
[im 5/28]
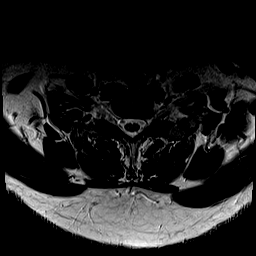
[im 8/28]
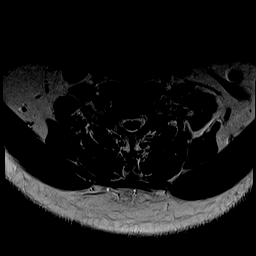
[im 13/28]
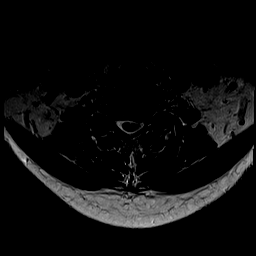
[im 15/28]
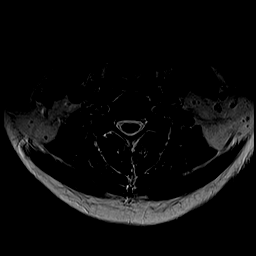
[im 20/28]
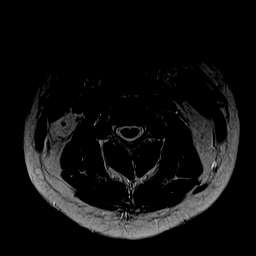
[im 23/28]
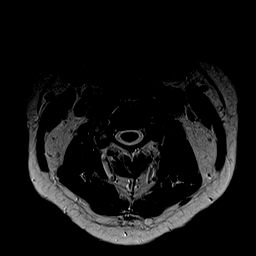
[im 25/28]
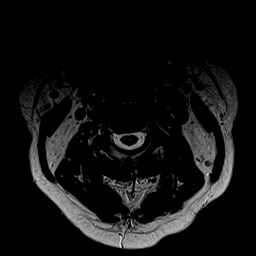
[im 28/28]
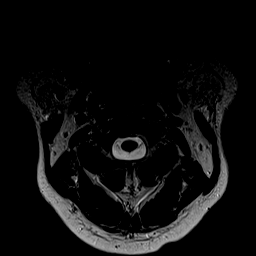

[29 of 48 positions shown; findings below may reference images not displayed]

FINDINGS: Alignment: Normal.

Vertebrae: No fracture, evidence of discitis, or bone lesion.

Cord: Normal signal throughout.

Posterior Fossa, vertebral arteries, paraspinal tissues: Negative.

Disc levels:

C2-3: Very shallow bulge to the right without stenosis.

C3-4: Mild uncovertebral spurring. Uncovertebral spurring causes
mild bilateral foraminal narrowing. The central canal is open.

C4-5: There is a very shallow disc bulge and right worse than left
uncovertebral disease. Mild to moderate foraminal narrowing is worse
on the right. The central canal is open.

C5-6: There is a shallow disc bulge and superimposed left side disc
protrusion. Left worse than right uncovertebral disease is also
seen. The patient's protrusion contacts the far left hemicord.
Moderate to moderately severe foraminal narrowing is worse on the
left.

C6-7: There is a shallow disc bulge, right foraminal protrusion and
uncovertebral disease on the right. The ventral cord is flattened
and there is severe right foraminal narrowing. Moderately severe
left foraminal narrowing is also seen.

C7-T1: Negative.
IMPRESSION: Given right upper extremity symptoms, dominant findings are at C6-7
where a right foraminal protrusion and uncovertebral disease cause
severe foraminal narrowing and impingement on the right C7 root.
Moderately severe left foraminal narrowing and mild flattening of
the ventral cord are also seen at this level.

Far left disc protrusion at C5-6 contacts the periphery of the left
hemicord. Moderate to moderately severe foraminal narrowing at C5-6
is worse on the left.

Right greater than left mild to moderate foraminal narrowing C4-5.

## 2021-06-04 DIAGNOSIS — M5412 Radiculopathy, cervical region: Secondary | ICD-10-CM | POA: Diagnosis not present

## 2021-06-10 DIAGNOSIS — M5412 Radiculopathy, cervical region: Secondary | ICD-10-CM | POA: Diagnosis not present

## 2021-06-16 ENCOUNTER — Other Ambulatory Visit: Payer: Self-pay | Admitting: Medical

## 2021-06-16 MED ORDER — TADALAFIL 5 MG PO TABS: 5.0000 mg | ORAL_TABLET | Freq: Every day | ORAL | 5 refills | Status: DC | PRN

## 2021-06-20 DIAGNOSIS — M5412 Radiculopathy, cervical region: Secondary | ICD-10-CM | POA: Diagnosis not present

## 2021-06-30 DIAGNOSIS — M5412 Radiculopathy, cervical region: Secondary | ICD-10-CM | POA: Diagnosis not present

## 2021-07-22 DIAGNOSIS — M5412 Radiculopathy, cervical region: Secondary | ICD-10-CM | POA: Diagnosis not present

## 2021-08-13 DIAGNOSIS — L918 Other hypertrophic disorders of the skin: Secondary | ICD-10-CM | POA: Diagnosis not present

## 2021-08-13 DIAGNOSIS — L720 Epidermal cyst: Secondary | ICD-10-CM | POA: Diagnosis not present

## 2021-08-13 DIAGNOSIS — L821 Other seborrheic keratosis: Secondary | ICD-10-CM | POA: Diagnosis not present

## 2021-08-13 DIAGNOSIS — Z85828 Personal history of other malignant neoplasm of skin: Secondary | ICD-10-CM | POA: Diagnosis not present

## 2021-08-14 DIAGNOSIS — K644 Residual hemorrhoidal skin tags: Secondary | ICD-10-CM | POA: Diagnosis not present

## 2021-08-14 DIAGNOSIS — K625 Hemorrhage of anus and rectum: Secondary | ICD-10-CM | POA: Diagnosis not present

## 2021-08-14 DIAGNOSIS — K6289 Other specified diseases of anus and rectum: Secondary | ICD-10-CM | POA: Diagnosis not present

## 2021-08-14 DIAGNOSIS — K602 Anal fissure, unspecified: Secondary | ICD-10-CM | POA: Diagnosis not present

## 2021-08-19 DIAGNOSIS — M5412 Radiculopathy, cervical region: Secondary | ICD-10-CM | POA: Diagnosis not present

## 2021-08-22 DIAGNOSIS — M9902 Segmental and somatic dysfunction of thoracic region: Secondary | ICD-10-CM | POA: Diagnosis not present

## 2021-08-22 DIAGNOSIS — M7912 Myalgia of auxiliary muscles, head and neck: Secondary | ICD-10-CM | POA: Diagnosis not present

## 2021-08-22 DIAGNOSIS — M9901 Segmental and somatic dysfunction of cervical region: Secondary | ICD-10-CM | POA: Diagnosis not present

## 2021-08-22 DIAGNOSIS — M5010 Cervical disc disorder with radiculopathy, unspecified cervical region: Secondary | ICD-10-CM | POA: Diagnosis not present

## 2021-08-25 DIAGNOSIS — M5412 Radiculopathy, cervical region: Secondary | ICD-10-CM | POA: Diagnosis not present

## 2021-09-02 DIAGNOSIS — M50122 Cervical disc disorder at C5-C6 level with radiculopathy: Secondary | ICD-10-CM | POA: Diagnosis not present

## 2021-09-02 DIAGNOSIS — M9902 Segmental and somatic dysfunction of thoracic region: Secondary | ICD-10-CM | POA: Diagnosis not present

## 2021-09-02 DIAGNOSIS — M9901 Segmental and somatic dysfunction of cervical region: Secondary | ICD-10-CM | POA: Diagnosis not present

## 2021-09-03 DIAGNOSIS — M9901 Segmental and somatic dysfunction of cervical region: Secondary | ICD-10-CM | POA: Diagnosis not present

## 2021-09-03 DIAGNOSIS — M9902 Segmental and somatic dysfunction of thoracic region: Secondary | ICD-10-CM | POA: Diagnosis not present

## 2021-09-03 DIAGNOSIS — M50122 Cervical disc disorder at C5-C6 level with radiculopathy: Secondary | ICD-10-CM | POA: Diagnosis not present

## 2021-09-04 DIAGNOSIS — M50122 Cervical disc disorder at C5-C6 level with radiculopathy: Secondary | ICD-10-CM | POA: Diagnosis not present

## 2021-09-04 DIAGNOSIS — M9902 Segmental and somatic dysfunction of thoracic region: Secondary | ICD-10-CM | POA: Diagnosis not present

## 2021-09-04 DIAGNOSIS — M9901 Segmental and somatic dysfunction of cervical region: Secondary | ICD-10-CM | POA: Diagnosis not present

## 2021-09-08 DIAGNOSIS — M9902 Segmental and somatic dysfunction of thoracic region: Secondary | ICD-10-CM | POA: Diagnosis not present

## 2021-09-08 DIAGNOSIS — M50122 Cervical disc disorder at C5-C6 level with radiculopathy: Secondary | ICD-10-CM | POA: Diagnosis not present

## 2021-09-08 DIAGNOSIS — M9901 Segmental and somatic dysfunction of cervical region: Secondary | ICD-10-CM | POA: Diagnosis not present

## 2021-09-09 DIAGNOSIS — M50122 Cervical disc disorder at C5-C6 level with radiculopathy: Secondary | ICD-10-CM | POA: Diagnosis not present

## 2021-09-09 DIAGNOSIS — M9902 Segmental and somatic dysfunction of thoracic region: Secondary | ICD-10-CM | POA: Diagnosis not present

## 2021-09-09 DIAGNOSIS — M9901 Segmental and somatic dysfunction of cervical region: Secondary | ICD-10-CM | POA: Diagnosis not present

## 2021-09-11 DIAGNOSIS — M50122 Cervical disc disorder at C5-C6 level with radiculopathy: Secondary | ICD-10-CM | POA: Diagnosis not present

## 2021-09-11 DIAGNOSIS — M9902 Segmental and somatic dysfunction of thoracic region: Secondary | ICD-10-CM | POA: Diagnosis not present

## 2021-09-11 DIAGNOSIS — M9901 Segmental and somatic dysfunction of cervical region: Secondary | ICD-10-CM | POA: Diagnosis not present

## 2021-09-15 DIAGNOSIS — M9901 Segmental and somatic dysfunction of cervical region: Secondary | ICD-10-CM | POA: Diagnosis not present

## 2021-09-15 DIAGNOSIS — M9902 Segmental and somatic dysfunction of thoracic region: Secondary | ICD-10-CM | POA: Diagnosis not present

## 2021-09-15 DIAGNOSIS — M50122 Cervical disc disorder at C5-C6 level with radiculopathy: Secondary | ICD-10-CM | POA: Diagnosis not present

## 2021-09-15 DIAGNOSIS — M5412 Radiculopathy, cervical region: Secondary | ICD-10-CM | POA: Diagnosis not present

## 2021-09-16 DIAGNOSIS — M50122 Cervical disc disorder at C5-C6 level with radiculopathy: Secondary | ICD-10-CM | POA: Diagnosis not present

## 2021-09-16 DIAGNOSIS — M9902 Segmental and somatic dysfunction of thoracic region: Secondary | ICD-10-CM | POA: Diagnosis not present

## 2021-09-16 DIAGNOSIS — M9901 Segmental and somatic dysfunction of cervical region: Secondary | ICD-10-CM | POA: Diagnosis not present

## 2021-09-17 ENCOUNTER — Telehealth: Payer: Self-pay

## 2021-09-17 NOTE — Telephone Encounter (Signed)
Patient called and stated that you recently told him the size of the aneurysm and cannot remember what you told him about it and is requesting you give him a call to discuss his questions and concerns on it, when you can today.

## 2021-09-17 NOTE — Telephone Encounter (Signed)
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  The study was done a year ago.  No study is indicated right now.  I sent a MyChart message.

## 2021-09-18 DIAGNOSIS — M9901 Segmental and somatic dysfunction of cervical region: Secondary | ICD-10-CM | POA: Diagnosis not present

## 2021-09-18 DIAGNOSIS — M50122 Cervical disc disorder at C5-C6 level with radiculopathy: Secondary | ICD-10-CM | POA: Diagnosis not present

## 2021-09-18 DIAGNOSIS — M9902 Segmental and somatic dysfunction of thoracic region: Secondary | ICD-10-CM | POA: Diagnosis not present

## 2021-09-18 NOTE — Telephone Encounter (Signed)
Patient called back, I gave him the message.

## 2021-09-18 NOTE — Telephone Encounter (Signed)
Called patient, NA, LMAM

## 2021-09-22 DIAGNOSIS — M9902 Segmental and somatic dysfunction of thoracic region: Secondary | ICD-10-CM | POA: Diagnosis not present

## 2021-09-22 DIAGNOSIS — M9901 Segmental and somatic dysfunction of cervical region: Secondary | ICD-10-CM | POA: Diagnosis not present

## 2021-09-22 DIAGNOSIS — M50122 Cervical disc disorder at C5-C6 level with radiculopathy: Secondary | ICD-10-CM | POA: Diagnosis not present

## 2021-09-24 DIAGNOSIS — M50122 Cervical disc disorder at C5-C6 level with radiculopathy: Secondary | ICD-10-CM | POA: Diagnosis not present

## 2021-09-24 DIAGNOSIS — M9902 Segmental and somatic dysfunction of thoracic region: Secondary | ICD-10-CM | POA: Diagnosis not present

## 2021-09-24 DIAGNOSIS — M9901 Segmental and somatic dysfunction of cervical region: Secondary | ICD-10-CM | POA: Diagnosis not present

## 2021-09-25 DIAGNOSIS — M9902 Segmental and somatic dysfunction of thoracic region: Secondary | ICD-10-CM | POA: Diagnosis not present

## 2021-09-25 DIAGNOSIS — M50122 Cervical disc disorder at C5-C6 level with radiculopathy: Secondary | ICD-10-CM | POA: Diagnosis not present

## 2021-09-25 DIAGNOSIS — M9901 Segmental and somatic dysfunction of cervical region: Secondary | ICD-10-CM | POA: Diagnosis not present

## 2021-09-29 DIAGNOSIS — M50122 Cervical disc disorder at C5-C6 level with radiculopathy: Secondary | ICD-10-CM | POA: Diagnosis not present

## 2021-09-29 DIAGNOSIS — M9901 Segmental and somatic dysfunction of cervical region: Secondary | ICD-10-CM | POA: Diagnosis not present

## 2021-09-29 DIAGNOSIS — M9902 Segmental and somatic dysfunction of thoracic region: Secondary | ICD-10-CM | POA: Diagnosis not present

## 2021-09-29 DIAGNOSIS — M50322 Other cervical disc degeneration at C5-C6 level: Secondary | ICD-10-CM | POA: Diagnosis not present

## 2021-10-02 DIAGNOSIS — M9901 Segmental and somatic dysfunction of cervical region: Secondary | ICD-10-CM | POA: Diagnosis not present

## 2021-10-02 DIAGNOSIS — M9902 Segmental and somatic dysfunction of thoracic region: Secondary | ICD-10-CM | POA: Diagnosis not present

## 2021-10-02 DIAGNOSIS — M50122 Cervical disc disorder at C5-C6 level with radiculopathy: Secondary | ICD-10-CM | POA: Diagnosis not present

## 2021-10-06 DIAGNOSIS — M50122 Cervical disc disorder at C5-C6 level with radiculopathy: Secondary | ICD-10-CM | POA: Diagnosis not present

## 2021-10-06 DIAGNOSIS — M9901 Segmental and somatic dysfunction of cervical region: Secondary | ICD-10-CM | POA: Diagnosis not present

## 2021-10-06 DIAGNOSIS — M9902 Segmental and somatic dysfunction of thoracic region: Secondary | ICD-10-CM | POA: Diagnosis not present

## 2021-10-08 DIAGNOSIS — M9901 Segmental and somatic dysfunction of cervical region: Secondary | ICD-10-CM | POA: Diagnosis not present

## 2021-10-08 DIAGNOSIS — M9902 Segmental and somatic dysfunction of thoracic region: Secondary | ICD-10-CM | POA: Diagnosis not present

## 2021-10-08 DIAGNOSIS — M50122 Cervical disc disorder at C5-C6 level with radiculopathy: Secondary | ICD-10-CM | POA: Diagnosis not present

## 2021-10-13 DIAGNOSIS — M50122 Cervical disc disorder at C5-C6 level with radiculopathy: Secondary | ICD-10-CM | POA: Diagnosis not present

## 2021-10-13 DIAGNOSIS — M9901 Segmental and somatic dysfunction of cervical region: Secondary | ICD-10-CM | POA: Diagnosis not present

## 2021-10-13 DIAGNOSIS — M9902 Segmental and somatic dysfunction of thoracic region: Secondary | ICD-10-CM | POA: Diagnosis not present

## 2021-10-14 DIAGNOSIS — H5231 Anisometropia: Secondary | ICD-10-CM | POA: Diagnosis not present

## 2021-10-14 DIAGNOSIS — H2513 Age-related nuclear cataract, bilateral: Secondary | ICD-10-CM | POA: Diagnosis not present

## 2021-10-14 DIAGNOSIS — H3562 Retinal hemorrhage, left eye: Secondary | ICD-10-CM | POA: Diagnosis not present

## 2021-10-19 DIAGNOSIS — K120 Recurrent oral aphthae: Secondary | ICD-10-CM | POA: Diagnosis not present

## 2021-10-19 DIAGNOSIS — K122 Cellulitis and abscess of mouth: Secondary | ICD-10-CM | POA: Diagnosis not present

## 2021-10-19 DIAGNOSIS — B9789 Other viral agents as the cause of diseases classified elsewhere: Secondary | ICD-10-CM | POA: Diagnosis not present

## 2021-10-19 DIAGNOSIS — J028 Acute pharyngitis due to other specified organisms: Secondary | ICD-10-CM | POA: Diagnosis not present

## 2021-10-21 DIAGNOSIS — M9902 Segmental and somatic dysfunction of thoracic region: Secondary | ICD-10-CM | POA: Diagnosis not present

## 2021-10-21 DIAGNOSIS — M9901 Segmental and somatic dysfunction of cervical region: Secondary | ICD-10-CM | POA: Diagnosis not present

## 2021-10-21 DIAGNOSIS — M50322 Other cervical disc degeneration at C5-C6 level: Secondary | ICD-10-CM | POA: Diagnosis not present

## 2021-10-30 DIAGNOSIS — M50322 Other cervical disc degeneration at C5-C6 level: Secondary | ICD-10-CM | POA: Diagnosis not present

## 2021-10-30 DIAGNOSIS — M9901 Segmental and somatic dysfunction of cervical region: Secondary | ICD-10-CM | POA: Diagnosis not present

## 2021-10-30 DIAGNOSIS — M9902 Segmental and somatic dysfunction of thoracic region: Secondary | ICD-10-CM | POA: Diagnosis not present

## 2021-11-02 ENCOUNTER — Other Ambulatory Visit: Payer: Self-pay | Admitting: Cardiology

## 2021-11-02 DIAGNOSIS — E7800 Pure hypercholesterolemia, unspecified: Secondary | ICD-10-CM

## 2021-11-04 DIAGNOSIS — K6289 Other specified diseases of anus and rectum: Secondary | ICD-10-CM | POA: Diagnosis not present

## 2021-11-04 DIAGNOSIS — L249 Irritant contact dermatitis, unspecified cause: Secondary | ICD-10-CM | POA: Diagnosis not present

## 2021-11-04 DIAGNOSIS — I781 Nevus, non-neoplastic: Secondary | ICD-10-CM | POA: Diagnosis not present

## 2021-11-07 ENCOUNTER — Other Ambulatory Visit: Payer: Self-pay | Admitting: Cardiology

## 2021-11-07 DIAGNOSIS — E7800 Pure hypercholesterolemia, unspecified: Secondary | ICD-10-CM

## 2021-11-25 ENCOUNTER — Ambulatory Visit: Payer: BC Managed Care – PPO | Admitting: Medical

## 2021-11-25 ENCOUNTER — Encounter: Payer: Self-pay | Admitting: Medical

## 2021-11-25 ENCOUNTER — Other Ambulatory Visit: Payer: Self-pay

## 2021-11-25 VITALS — BP 142/98 | HR 88 | Temp 96.9°F | Ht 71.0 in | Wt 237.0 lb

## 2021-11-25 DIAGNOSIS — E7800 Pure hypercholesterolemia, unspecified: Secondary | ICD-10-CM

## 2021-11-25 DIAGNOSIS — Z87891 Personal history of nicotine dependence: Secondary | ICD-10-CM

## 2021-11-25 DIAGNOSIS — J40 Bronchitis, not specified as acute or chronic: Secondary | ICD-10-CM

## 2021-11-25 DIAGNOSIS — J329 Chronic sinusitis, unspecified: Secondary | ICD-10-CM | POA: Diagnosis not present

## 2021-11-25 DIAGNOSIS — I10 Essential (primary) hypertension: Secondary | ICD-10-CM

## 2021-11-25 DIAGNOSIS — J4530 Mild persistent asthma, uncomplicated: Secondary | ICD-10-CM

## 2021-11-25 DIAGNOSIS — I712 Thoracic aortic aneurysm, without rupture, unspecified: Secondary | ICD-10-CM

## 2021-11-25 MED ORDER — ROSUVASTATIN CALCIUM 10 MG PO TABS: 10.0000 mg | ORAL_TABLET | Freq: Every day | ORAL | 0 refills | Status: DC

## 2021-11-25 MED ORDER — AZITHROMYCIN 250 MG PO TABS: ORAL_TABLET | ORAL | 0 refills | Status: DC

## 2021-11-25 MED ORDER — IRBESARTAN 300 MG PO TABS: 300.0000 mg | ORAL_TABLET | Freq: Every day | ORAL | 0 refills | Status: DC

## 2021-11-25 NOTE — Progress Notes (Signed)
Subjective:  Cody Tapia is a 56 y.o. male who presents for illness lingering.   Chief Complaint  Patient presents with   Cough    With nasal and chest congestion for 10 days, fatigue, no fever   He reports head congestion, chest congestion, fatigue, stuffy.  No body aches or chills, no fever.  Runned down feeling, fatigue.   No sore throat.   Feels some wheezy.  No NVD.  Has used Symbicort a few times during this.  This did help wheezing/congestion.   Started about 9 days ago with sinus and chest congestion.  Had 2 at home covid tests that were negative. Wife got sick from him.   No other aggravating or relieving factors.    He may need some refills.  GI had prescribed Linzess in the past.  Would like a refill on this today call the dose  He also needs on blood pressure and cholesterol medicine  No other c/o.  The following portions of the patient's history were reviewed and updated as appropriate: allergies, current medications, past family history, past medical history, past social history, past surgical history and problem list.  ROS as in subjective  Past Medical History:  Diagnosis Date   23-polyvalent pneumococcal polysaccharide vaccine declined 7/14   Anxiety    prior therapy with counselor, high stress job, 12+ hour work days   Asthma    ED (erectile dysfunction)    Former smoker 2021   GERD (gastroesophageal reflux disease)    pulmonology consult 2020   Hypercholesteremia    Hyperglycemia    Hypertension    Obesity    Thoracic ascending aortic aneurysm    Wears glasses    Current Outpatient Medications on File Prior to Visit  Medication Sig Dispense Refill   budesonide-formoterol (SYMBICORT) 160-4.5 MCG/ACT inhaler Inhale 2 puffs into the lungs 2 (two) times daily. 1 each 3   cholecalciferol (VITAMIN D3) 25 MCG (1000 UNIT) tablet Take 2 tablets (2,000 Units total) by mouth daily. 180 tablet 3   Multiple Vitamin (MULTIVITAMIN) tablet Take 1 tablet by mouth  daily.     pantoprazole (PROTONIX) 40 MG tablet Take 1 tablet (40 mg total) by mouth daily. 90 tablet 3   tadalafil (CIALIS) 20 MG tablet Take 1 tablet (20 mg total) by mouth daily. as directed 8 tablet 11   tadalafil (CIALIS) 5 MG tablet Take 1 tablet (5 mg total) by mouth daily as needed for erectile dysfunction. And BPH 30 tablet 5   Testosterone 12.5 MG/ACT (1%) GEL Place 2 application onto the skin daily. (Patient not taking: Reported on 11/25/2021) 75 g 1   valACYclovir (VALTREX) 1000 MG tablet 2 tablets po BID x 2 days for flare up (Patient not taking: Reported on 11/25/2021) 30 tablet 2   No current facility-administered medications on file prior to visit.   ROS as in subjective   Objective: BP (!) 142/98 (BP Location: Right Arm, Patient Position: Sitting)    Pulse 88    Temp (!) 96.9 F (36.1 C) (Tympanic)    Ht 5\' 11"  (1.803 m)    Wt 237 lb (107.5 kg)    SpO2 96%    BMI 33.05 kg/m   General appearance: Alert, WD/WN, no distress, somewhat ill appearing                             Skin: warm, no rash, no diaphoresis  Head:  sinus tenderness                            Eyes: conjunctiva normal, corneas clear, PERRLA                            Ears: pearly TMs, external ear canals normal                          Nose: septum midline, turbinates swollen, with erythema and clear discharge             Mouth/throat: MMM, tongue normal, mild pharyngeal erythema                           Neck: supple, no adenopathy, no thyromegaly, nontender                          Heart: RRR, normal S1, S2, no murmurs                         Lungs: +bronchial breath sounds, no rhonchi, no wheezes, no rales                Extremities: no edema, nontender      Assessment: Encounter Diagnoses  Name Primary?   Sinobronchitis Yes   Essential hypertension    Hypercholesteremia    Former smoker    Mild persistent asthma without complication    Thoracic aortic aneurysm without  rupture, unspecified part      Plan:  Begin Z-Pak, suggested symptomatic OTC remedies for cough and congestion.  Tylenol or Ibuprofen OTC for fever and malaise.  Call/return in 2-3 days if symptoms are worse or not improving.  Advised that cough may linger even after the infection is improved.     I refilled his medications as below advise he go in follow-up with cardiology and gastroenterology as planned  Cody Tapia was seen today for cough.  Diagnoses and all orders for this visit:  Sinobronchitis  Essential hypertension -     irbesartan (AVAPRO) 300 MG tablet; Take 1 tablet (300 mg total) by mouth daily.  Hypercholesteremia -     rosuvastatin (CRESTOR) 10 MG tablet; Take 1 tablet (10 mg total) by mouth daily.  Former smoker  Mild persistent asthma without complication  Thoracic aortic aneurysm without rupture, unspecified part  Other orders -     azithromycin (ZITHROMAX) 250 MG tablet; 2 tablets day 1, then 1 tablet days 2-4   F/u prn

## 2021-11-26 DIAGNOSIS — K602 Anal fissure, unspecified: Secondary | ICD-10-CM | POA: Diagnosis not present

## 2021-11-26 DIAGNOSIS — K5900 Constipation, unspecified: Secondary | ICD-10-CM | POA: Diagnosis not present

## 2021-11-27 ENCOUNTER — Other Ambulatory Visit: Payer: Self-pay | Admitting: Medical

## 2021-11-27 MED ORDER — PREDNISONE 10 MG PO TABS: ORAL_TABLET | ORAL | 0 refills | Status: DC

## 2021-12-01 DIAGNOSIS — M5412 Radiculopathy, cervical region: Secondary | ICD-10-CM | POA: Diagnosis not present

## 2021-12-07 ENCOUNTER — Other Ambulatory Visit: Payer: Self-pay | Admitting: Medical

## 2021-12-07 DIAGNOSIS — E7800 Pure hypercholesterolemia, unspecified: Secondary | ICD-10-CM

## 2021-12-15 ENCOUNTER — Other Ambulatory Visit: Payer: Self-pay | Admitting: Medical

## 2022-01-02 DIAGNOSIS — H04123 Dry eye syndrome of bilateral lacrimal glands: Secondary | ICD-10-CM | POA: Diagnosis not present

## 2022-01-05 ENCOUNTER — Other Ambulatory Visit: Payer: Self-pay | Admitting: Medical

## 2022-01-05 MED ORDER — TADALAFIL 20 MG PO TABS: 20.0000 mg | ORAL_TABLET | Freq: Every day | ORAL | 2 refills | Status: DC

## 2022-01-06 ENCOUNTER — Other Ambulatory Visit: Payer: Self-pay | Admitting: Medical

## 2022-01-06 DIAGNOSIS — E7800 Pure hypercholesterolemia, unspecified: Secondary | ICD-10-CM

## 2022-01-08 ENCOUNTER — Encounter: Payer: Self-pay | Admitting: Internal Medicine

## 2022-01-27 ENCOUNTER — Telehealth: Payer: Self-pay | Admitting: Cardiology

## 2022-01-27 NOTE — Telephone Encounter (Signed)
Patient needs refills on rosuvastatin and irbesartan. ?

## 2022-01-28 ENCOUNTER — Other Ambulatory Visit: Payer: Self-pay

## 2022-01-29 ENCOUNTER — Other Ambulatory Visit: Payer: Self-pay

## 2022-01-29 NOTE — Telephone Encounter (Signed)
Pt does not have an appt with [place] until 11/02/2022. It also looks like his PCP has been filling this. Did you want to take over filling his rosuvastatin and irbesartan?

## 2022-01-29 NOTE — Telephone Encounter (Signed)
He has not seen me since 2001, his lipids and hypertension are being managed by PCP, hence okay for PCP to refill the prescriptions.  You can give him a earlier appointment with me maybe in 2 months or so.

## 2022-01-30 ENCOUNTER — Other Ambulatory Visit: Payer: Self-pay

## 2022-01-30 DIAGNOSIS — I10 Essential (primary) hypertension: Secondary | ICD-10-CM

## 2022-01-30 MED ORDER — IRBESARTAN 300 MG PO TABS: 300.0000 mg | ORAL_TABLET | Freq: Every day | ORAL | 2 refills | Status: DC

## 2022-01-30 NOTE — Telephone Encounter (Signed)
Called and spoke to pt, pt aware.

## 2022-01-30 NOTE — Telephone Encounter (Signed)
Called pt, no answer. Left vm requesting call back?

## 2022-03-24 DIAGNOSIS — Z85828 Personal history of other malignant neoplasm of skin: Secondary | ICD-10-CM | POA: Diagnosis not present

## 2022-03-24 DIAGNOSIS — L700 Acne vulgaris: Secondary | ICD-10-CM | POA: Diagnosis not present

## 2022-03-24 DIAGNOSIS — D1801 Hemangioma of skin and subcutaneous tissue: Secondary | ICD-10-CM | POA: Diagnosis not present

## 2022-03-24 DIAGNOSIS — L821 Other seborrheic keratosis: Secondary | ICD-10-CM | POA: Diagnosis not present

## 2022-04-16 ENCOUNTER — Other Ambulatory Visit: Payer: Self-pay | Admitting: Medical

## 2022-04-16 DIAGNOSIS — K219 Gastro-esophageal reflux disease without esophagitis: Secondary | ICD-10-CM

## 2022-04-16 NOTE — Telephone Encounter (Signed)
Pt has an appt in july

## 2022-04-21 ENCOUNTER — Ambulatory Visit: Payer: BC Managed Care – PPO | Admitting: Medical

## 2022-04-21 ENCOUNTER — Encounter: Payer: Self-pay | Admitting: Medical

## 2022-04-21 VITALS — Temp 97.6°F | Wt 216.4 lb

## 2022-04-21 DIAGNOSIS — I712 Thoracic aortic aneurysm, without rupture, unspecified: Secondary | ICD-10-CM

## 2022-04-21 DIAGNOSIS — I10 Essential (primary) hypertension: Secondary | ICD-10-CM

## 2022-04-21 DIAGNOSIS — N529 Male erectile dysfunction, unspecified: Secondary | ICD-10-CM | POA: Diagnosis not present

## 2022-04-21 DIAGNOSIS — R42 Dizziness and giddiness: Secondary | ICD-10-CM

## 2022-04-21 NOTE — Progress Notes (Signed)
Subjective:  Cody Tapia is a 56 y.o. male who presents for Chief Complaint  Patient presents with   felt faint    Worked out this morning and did cardio. Went to grocery store and then went to work but got out of car got nauseated and vision got very bright and turned the car on cold air and had to sit there. Took about 10 mins to go away but now feels weak     Here for concerns about recent event that happened this morning.  He was at the gym this morning exercise on the elliptical for about 40 minutes.  Shortly thereafter he went to the grocery store.  Later in the morning he went to work.  When he went to get out of his car everything got really bright where he had to put his sunglasses on.  He felt like he was going to blackout, felt hot, sweating profusely, had some nausea.  This lasted for seconds to minutes.  He notes that he did not feel great when he got up this morning in general.  After little while the symptoms went away.  He called his fiance and she came up to his office and sat with him for little while until he was feeling better.  He also notes in recent weeks having 2 episodes of feeling flushed when he was bending over in the yard and standing back up.  He is compliant with his blood pressure medicine daily.  He does endorse that he used Cialis in last 24 hours  He has been working out hard and has recently lost over 10 pounds.  No other aggravating or relieving factors.    No other c/o.  Past Medical History:  Diagnosis Date   23-polyvalent pneumococcal polysaccharide vaccine declined 7/14   Anxiety    prior therapy with counselor, high stress job, 12+ hour work days   Asthma    ED (erectile dysfunction)    Former smoker 2021   GERD (gastroesophageal reflux disease)    pulmonology consult 2020   Hypercholesteremia    Hyperglycemia    Hypertension    Obesity    Thoracic ascending aortic aneurysm ([place])    Wears glasses    Current Outpatient Medications  on File Prior to Visit  Medication Sig Dispense Refill   budesonide-formoterol (SYMBICORT) 160-4.5 MCG/ACT inhaler Inhale 2 puffs into the lungs 2 (two) times daily. 1 each 3   cholecalciferol (VITAMIN D3) 25 MCG (1000 UNIT) tablet Take 2 tablets (2,000 Units total) by mouth daily. 180 tablet 3   irbesartan (AVAPRO) 300 MG tablet Take 1 tablet (300 mg total) by mouth daily. 30 tablet 2   Multiple Vitamin (MULTIVITAMIN) tablet Take 1 tablet by mouth daily.     pantoprazole (PROTONIX) 40 MG tablet Take 1 tablet by mouth once daily 90 tablet 0   rosuvastatin (CRESTOR) 10 MG tablet Take 1 tablet by mouth once daily 30 tablet 2   tadalafil (CIALIS) 20 MG tablet Take 1 tablet (20 mg total) by mouth daily. as directed 8 tablet 2   valACYclovir (VALTREX) 1000 MG tablet 2 tablets po BID x 2 days for flare up 30 tablet 2   Testosterone 12.5 MG/ACT (1%) GEL Place 2 application onto the skin daily. (Patient not taking: Reported on 11/25/2021) 75 g 1   No current facility-administered medications on file prior to visit.    The following portions of the patient's history were reviewed and updated as appropriate: allergies, current medications,  past family history, past medical history, past social history, past surgical history and problem list.  ROS Otherwise as in subjective above    Objective: Temp 97.6 F (36.4 C)   Wt 216 lb 6.4 oz (98.2 kg)   BMI 30.18 kg/m   BP Readings from Last 3 Encounters:  11/25/21 (!) 142/98  04/23/21 120/70  01/20/21 118/76   Wt Readings from Last 3 Encounters:  04/21/22 216 lb 6.4 oz (98.2 kg)  11/25/21 237 lb (107.5 kg)  04/23/21 237 lb (107.5 kg)    General appearance: alert, no distress, well developed, well nourished HEENT: normocephalic, sclerae anicteric, conjunctiva pink and moist Oral cavity: MMM, no lesions Neck: supple, no lymphadenopathy, no thyromegaly, no masses, no bruits Heart: RRR, normal S1, S2, no murmurs Lungs: CTA bilaterally, no  wheezes, rhonchi, or rales Pulses: 2+ radial pulses, 2+ pedal pulses, normal cap refill Ext: no edema  EKG reviewed  Assessment: Encounter Diagnoses  Name Primary?   Lightheaded Yes   Primary hypertension    Thoracic aortic aneurysm without rupture, unspecified part ([place])    Erectile dysfunction, unspecified erectile dysfunction type      Plan: EKG reviewed, no acute change.  I suspect the fact that he has lost weight recently, worked out a little extra hard this morning and took a Cialis last night, all of this could have led to a vasovagal episode.  I asked him to get a blood pressure cuff and start checking his blood pressures.  We discussed goal blood pressure.  We also discussed what would constitute low or high blood pressure readings.  He will cut his irbesartan in half to 150 mg daily for now and monitor blood pressures.  He will continue to hydrate well.  He will go ahead and call to schedule follow-up with cardiology.  He is due for an ultrasound at this point given history of thoracic aortic aneurysm  Cody Tapia was seen today for felt faint.  Diagnoses and all orders for this visit:  Lightheaded -     EKG 12-Lead  Primary hypertension  Thoracic aortic aneurysm without rupture, unspecified part ([place])  Erectile dysfunction, unspecified erectile dysfunction type    Follow up: with cardiology soon

## 2022-04-22 ENCOUNTER — Encounter: Payer: Self-pay | Admitting: Medical

## 2022-04-24 ENCOUNTER — Other Ambulatory Visit: Payer: Self-pay | Admitting: Medical

## 2022-04-24 DIAGNOSIS — I10 Essential (primary) hypertension: Secondary | ICD-10-CM

## 2022-04-30 ENCOUNTER — Ambulatory Visit: Payer: BC Managed Care – PPO | Admitting: Cardiology

## 2022-04-30 ENCOUNTER — Encounter: Payer: Self-pay | Admitting: Cardiology

## 2022-04-30 VITALS — BP 123/80 | HR 77 | Temp 98.3°F | Resp 16 | Ht 71.0 in | Wt 215.6 lb

## 2022-04-30 DIAGNOSIS — E7800 Pure hypercholesterolemia, unspecified: Secondary | ICD-10-CM

## 2022-04-30 DIAGNOSIS — I7121 Aneurysm of the ascending aorta, without rupture: Secondary | ICD-10-CM | POA: Diagnosis not present

## 2022-04-30 DIAGNOSIS — R55 Syncope and collapse: Secondary | ICD-10-CM

## 2022-04-30 DIAGNOSIS — I10 Essential (primary) hypertension: Secondary | ICD-10-CM

## 2022-04-30 MED ORDER — IRBESARTAN 150 MG PO TABS: 150.0000 mg | ORAL_TABLET | Freq: Every day | ORAL | 3 refills | Status: DC

## 2022-04-30 NOTE — Progress Notes (Signed)
Primary Physician/Referring:  Carlena Hurl, PA-C  Patient ID: Cody Tapia, male    DOB: 23-Sep-1966, 56 y.o.   MRN: [id number]  Chief Complaint  Patient presents with   Hypertension   Follow-up   HPI:    Cody Tapia  is a 56 y.o. male  with chronic tobacco use quit in 2020, mild obesity, hyperlipidemia, erectile dysfunction, CT scan of the chest on 11/18/2018 demonstrated mid ascending thoracic aorta measuring 38 to 40 mm indicating fusiform dilatation, no coronary artery calcification, but found calcified granulomas in the right upper lobe, azygos lobe, and right hilum-mediastinum.   He was supposed to see me every 2 years, however this is 1.5-year visit due to near syncope.  About 3 weeks ago, patient had just gotten out of his car and felt near syncopal with dizziness, mild diaphoresis and near syncope.  Clearly his symptoms suggestive of vasovagal episode and not related to aortic aneurysm.  Patient presents for annual follow up.  He has remained abstinent from tobacco use.  He has been trying to exercise and be physically active as much as possible.  He has no specific complaints today.  Past Medical History:  Diagnosis Date   23-polyvalent pneumococcal polysaccharide vaccine declined 7/14   Anxiety    prior therapy with counselor, high stress job, 12+ hour work days   Asthma    ED (erectile dysfunction)    Former smoker 2021   GERD (gastroesophageal reflux disease)    pulmonology consult 2020   Hypercholesteremia    Hyperglycemia    Hypertension    Obesity    Thoracic ascending aortic aneurysm ([employer])    Wears glasses    Past Surgical History:  Procedure Laterality Date   COLONOSCOPY  2009   [facility] for abdominal pain   COLONOSCOPY  12/2018   polyps TA, diverticulosis, Dr. Wilfrid Lund   ESOPHAGOGASTRODUODENOSCOPY  2009   [facility], abdominal pain   EYE SURGERY     strabismus surgery, right   WISDOM TOOTH EXTRACTION     Social History    Tobacco Use   Smoking status: Former    Packs/day: 0.25    Years: 30.00    Total pack years: 7.50    Types: Cigarettes    Quit date: 10/14/2019    Years since quitting: 2.5   Smokeless tobacco: Never  Substance Use Topics   Alcohol use: Yes    Alcohol/week: 3.0 standard drinks of alcohol    Types: 3 Glasses of wine per week   Marital Status: Single  ROS  Review of Systems  Cardiovascular:  Negative for chest pain, dyspnea on exertion and leg swelling.  Gastrointestinal:  Negative for melena.   Objective  Blood pressure 123/80, pulse 77, temperature 98.3 F (36.8 C), temperature source Temporal, resp. rate 16, height '5\' 11"'$  (1.803 m), weight 215 lb 9.6 oz (97.8 kg), SpO2 96 %.     04/30/2022    1:21 PM 04/21/2022   12:13 PM 11/25/2021    1:02 PM  Vitals with BMI  Height '5\' 11"'$   '5\' 11"'$   Weight 215 lbs 10 oz 216 lbs 6 oz 237 lbs  BMI 50.35  46.56  Systolic 812  751  Diastolic 80  98  Pulse 77  88    Physical Exam Neck:     Vascular: No JVD.  Cardiovascular:     Rate and Rhythm: Normal rate and regular rhythm.     Pulses: Intact distal pulses.  Heart sounds: Normal heart sounds. No murmur heard.    No gallop.  Pulmonary:     Effort: Pulmonary effort is normal.     Breath sounds: Normal breath sounds.  Abdominal:     General: Bowel sounds are normal.     Palpations: Abdomen is soft.  Musculoskeletal:     Right lower leg: No edema.     Left lower leg: No edema.    Laboratory examination:   No results for input(s): "NA", "K", "CL", "CO2", "GLUCOSE", "BUN", "CREATININE", "CALCIUM", "GFRNONAA", "GFRAA" in the last 8760 hours.  CrCl cannot be calculated (Patient's most recent lab result is older than the maximum 21 days allowed.).     Latest Ref Rng & Units 04/23/2021   10:30 AM 06/20/2020   10:49 AM 10/17/2019    3:11 AM  CMP  Glucose 65 - 99 mg/dL 83  99  135   BUN 6 - 24 mg/dL '20  19  26   '$ Creatinine 0.76 - 1.27 mg/dL 1.08  1.13  1.02   Sodium 134 - 144  mmol/L 138  137  138   Potassium 3.5 - 5.2 mmol/L 4.4  4.6  4.8   Chloride 96 - 106 mmol/L 102  102  105   CO2 20 - 29 mmol/L '19  20  23   '$ Calcium 8.7 - 10.2 mg/dL 9.3  9.4  9.5   Total Protein 6.0 - 8.5 g/dL 7.0  7.2  6.7   Total Bilirubin 0.0 - 1.2 mg/dL 0.6  0.7  0.7   Alkaline Phos 44 - 121 IU/L 121  126  86   AST 0 - 40 IU/L '17  21  16   '$ ALT 0 - 44 IU/L 32  26  23       Latest Ref Rng & Units 04/23/2021   10:30 AM 01/10/2020    9:19 AM 10/17/2019    3:11 AM  CBC  WBC 3.4 - 10.8 x10E3/uL 14.3  6.9  20.6   Hemoglobin 13.0 - 17.7 g/dL 15.5  15.9  14.4   Hematocrit 37.5 - 51.0 % 45.3  46.5  43.0   Platelets 150 - 450 x10E3/uL 350  318  274     Lab Results  Component Value Date   CHOL 139 04/23/2021   HDL 56 04/23/2021   LDLCALC 60 04/23/2021   TRIG 133 04/23/2021   CHOLHDL 2.5 04/23/2021     HEMOGLOBIN A1C Lab Results  Component Value Date   HGBA1C 5.6 01/10/2020   MPG 105 01/14/2017   TSH No results for input(s): "TSH" in the last 8760 hours.   Medications and allergies   Allergies  Allergen Reactions   Augmentin [Amoxicillin-Pot Clavulanate]     Bad diarrhea/GI upset   Qsymia [Phentermine-Topiramate]     Diarrhea      Current Outpatient Medications:    budesonide-formoterol (SYMBICORT) 160-4.5 MCG/ACT inhaler, Inhale 2 puffs into the lungs 2 (two) times daily., Disp: 1 each, Rfl: 3   cholecalciferol (VITAMIN D3) 25 MCG (1000 UNIT) tablet, Take 2 tablets (2,000 Units total) by mouth daily., Disp: 180 tablet, Rfl: 3   Multiple Vitamin (MULTIVITAMIN) tablet, Take 1 tablet by mouth daily., Disp: , Rfl:    pantoprazole (PROTONIX) 40 MG tablet, Take 1 tablet by mouth once daily, Disp: 90 tablet, Rfl: 0   rosuvastatin (CRESTOR) 10 MG tablet, Take 1 tablet by mouth once daily, Disp: 30 tablet, Rfl: 2   tadalafil (CIALIS) 20 MG tablet, Take 1 tablet (  20 mg total) by mouth daily. as directed, Disp: 8 tablet, Rfl: 2   valACYclovir (VALTREX) 1000 MG tablet, 2 tablets po  BID x 2 days for flare up, Disp: 30 tablet, Rfl: 2   irbesartan (AVAPRO) 150 MG tablet, Take 1 tablet (150 mg total) by mouth daily., Disp: 90 tablet, Rfl: 3   Radiology:  CTA chest 10/16/2019: 1. Examination is generally limited by breath motion artifact. Within this limitation, there are scattered, generally peribronchovascular and somewhat geographic ground-glass opacities, which are fluctuant in comparison to prior examination dated 10/02/2019 (series 10, image 96, 61). Mild bronchial wall thickening. No significant air trapping on expiratory phase imaging. These opacities are nonspecific and infectious or inflammatory given fluctuant nature. No evidence of fibrotic interstitial lung disease. 2. Calcific stigma of prior granulomatous disease of the right lung. 3. The tubular ascending thoracic aorta is enlarged, measuring 4.2 x4.1 cm, not significantly changed compared to prior examinations, CTA Chest  11/18/2018   CTA chest 10/10/2020:  Greatest estimated diameter of the ascending aorta on this non gated study 3.8 cm, which is no larger than the prior comparison CTs. No significant aortic valve calcification.  Interval resolution of infectious/inflammatory changes of the lungs, with redemonstration of stigmata of prior granulomatous disease.  Cardiac Studies:   Echocardiogram 01/17/2019: Left ventricle cavity is normal in size. Mild concentric hypertrophy of the left ventricle. Normal global wall motion. Normal diastolic filling pattern. Calculated EF 51%. No significant valvular abnormalities. IVC is dilated with respiratory variation. This may suggest elevated right heart pressure   Exercise sestamibi stress test 01/06/2019: 1. The patient performed treadmill exercise using Bruce protocol, completing 9:37 minutes. The patient completed an estimated workload of 11.1 METS, reaching 92% of the maximum predicted heart rate. Exercise capacity was excellent. Hemodynamic response was normal.  Stress symptoms included fatigue. No ischemic changes seen on stress electrocardiogram. 2. The overall quality of the study is excellent. There is no evidence of abnormal lung activity. Stress and rest SPECT images demonstrate homogeneous tracer distribution throughout the myocardium. Gated SPECT imaging reveals normal myocardial thickening and wall motion. The left ventricular ejection fraction was normal (66%).   3. Low risk study.  EKG  EKG 04/21/2022: Normal sinus rhythm at rate of 67 bpm, normal EKG. no significant change from 10/31/2020.   Assessment     ICD-10-CM   1. Essential hypertension  I10 irbesartan (AVAPRO) 150 MG tablet    CT ANGIO CHEST AORTA W/CM & OR WO/CM    CANCELED: EKG 12-Lead    2. Aneurysm of ascending aorta without rupture ([facility])  I71.21 irbesartan (AVAPRO) 150 MG tablet    CT ANGIO CHEST AORTA W/CM & OR WO/CM    3. Hypercholesteremia  E78.00     4. Vasovagal episode  R55          Meds ordered this encounter  Medications   irbesartan (AVAPRO) 150 MG tablet    Sig: Take 1 tablet (150 mg total) by mouth daily.    Dispense:  90 tablet    Refill:  3   Medications Discontinued During This Encounter  Medication Reason   Testosterone 12.5 MG/ACT (1%) GEL    irbesartan (AVAPRO) 300 MG tablet Reorder    Recommendations:    Galileo Colello  is a 56 y.o.  male  with chronic tobacco use quit in 2020, mild obesity, hyperlipidemia, erectile dysfunction, CT scan of the chest on 11/18/2018 demonstrated mid ascending thoracic aorta measuring 38 to 40 mm indicating fusiform dilatation,  no coronary artery calcification, but found calcified granulomas in the right upper lobe, azygos lobe, and right hilum-mediastinum.   He was supposed to see me every 2 years, however this is 1.5-year visit due to near syncope.  About 3 weeks ago, patient had just gotten out of his car and felt near syncopal with dizziness, mild diaphoresis and near syncope.  Clearly his symptoms  suggestive of vasovagal episode and not related to aortic aneurysm.  As his aneurysm has been stable since 2019, I decided on last year that we will do CT angiogram every other year.  We will schedule this for December 2023.  With regard to hypertension, he has lost significant amount of weight, his BMI is reduced from almost 34 to the present 30, blood pressure is now well controlled.  His episode of near vasovagal episode could also have been related to low blood pressure as well.  Agree with reducing the dose of the Avapro from 300 mg to 150 mg daily.  Otherwise stable from cardiac standpoint, I reassured him, I will see him back in 6 months, if the scan is stable, I will continue to see him back every 2 years.  I have reviewed the EKG from the PCPs office as well.   Adrian Prows, MD, [facility] 04/30/2022, 2:09 PM Office: [phone] Pager: [phone]

## 2022-05-18 ENCOUNTER — Other Ambulatory Visit: Payer: Self-pay | Admitting: Medical

## 2022-06-04 ENCOUNTER — Encounter: Payer: BC Managed Care – PPO | Admitting: Medical

## 2022-06-25 ENCOUNTER — Other Ambulatory Visit: Payer: Self-pay | Admitting: Medical

## 2022-06-25 DIAGNOSIS — E7800 Pure hypercholesterolemia, unspecified: Secondary | ICD-10-CM

## 2022-06-30 ENCOUNTER — Other Ambulatory Visit: Payer: Self-pay | Admitting: Medical

## 2022-06-30 DIAGNOSIS — E7800 Pure hypercholesterolemia, unspecified: Secondary | ICD-10-CM

## 2022-07-07 ENCOUNTER — Encounter: Payer: Self-pay | Admitting: Medical

## 2022-07-07 ENCOUNTER — Ambulatory Visit: Payer: BC Managed Care – PPO | Admitting: Medical

## 2022-07-07 VITALS — BP 120/70 | HR 75 | Ht 70.0 in | Wt 220.2 lb

## 2022-07-07 DIAGNOSIS — E785 Hyperlipidemia, unspecified: Secondary | ICD-10-CM | POA: Diagnosis not present

## 2022-07-07 DIAGNOSIS — N529 Male erectile dysfunction, unspecified: Secondary | ICD-10-CM

## 2022-07-07 DIAGNOSIS — I7121 Aneurysm of the ascending aorta, without rupture: Secondary | ICD-10-CM

## 2022-07-07 DIAGNOSIS — K219 Gastro-esophageal reflux disease without esophagitis: Secondary | ICD-10-CM

## 2022-07-07 DIAGNOSIS — Z0000 Encounter for general adult medical examination without abnormal findings: Secondary | ICD-10-CM

## 2022-07-07 DIAGNOSIS — J45909 Unspecified asthma, uncomplicated: Secondary | ICD-10-CM

## 2022-07-07 DIAGNOSIS — R058 Other specified cough: Secondary | ICD-10-CM

## 2022-07-07 DIAGNOSIS — K6289 Other specified diseases of anus and rectum: Secondary | ICD-10-CM

## 2022-07-07 DIAGNOSIS — I10 Essential (primary) hypertension: Secondary | ICD-10-CM

## 2022-07-07 DIAGNOSIS — R079 Chest pain, unspecified: Secondary | ICD-10-CM

## 2022-07-07 DIAGNOSIS — G8929 Other chronic pain: Secondary | ICD-10-CM

## 2022-07-07 DIAGNOSIS — F419 Anxiety disorder, unspecified: Secondary | ICD-10-CM

## 2022-07-07 DIAGNOSIS — B001 Herpesviral vesicular dermatitis: Secondary | ICD-10-CM

## 2022-07-07 DIAGNOSIS — E7800 Pure hypercholesterolemia, unspecified: Secondary | ICD-10-CM

## 2022-07-07 DIAGNOSIS — Z8719 Personal history of other diseases of the digestive system: Secondary | ICD-10-CM

## 2022-07-07 DIAGNOSIS — H52209 Unspecified astigmatism, unspecified eye: Secondary | ICD-10-CM

## 2022-07-07 DIAGNOSIS — Z125 Encounter for screening for malignant neoplasm of prostate: Secondary | ICD-10-CM

## 2022-07-07 DIAGNOSIS — J4530 Mild persistent asthma, uncomplicated: Secondary | ICD-10-CM

## 2022-07-07 DIAGNOSIS — K635 Polyp of colon: Secondary | ICD-10-CM

## 2022-07-07 DIAGNOSIS — Z87438 Personal history of other diseases of male genital organs: Secondary | ICD-10-CM

## 2022-07-07 DIAGNOSIS — Z7185 Encounter for immunization safety counseling: Secondary | ICD-10-CM

## 2022-07-07 DIAGNOSIS — R5383 Other fatigue: Secondary | ICD-10-CM

## 2022-07-07 DIAGNOSIS — Z87891 Personal history of nicotine dependence: Secondary | ICD-10-CM

## 2022-07-07 LAB — POCT URINALYSIS DIP (PROADVANTAGE DEVICE)
Bilirubin, UA: NEGATIVE
Blood, UA: NEGATIVE
Glucose, UA: NEGATIVE mg/dL
Ketones, POC UA: NEGATIVE mg/dL
Leukocytes, UA: NEGATIVE
Nitrite, UA: NEGATIVE
Protein Ur, POC: NEGATIVE mg/dL
Specific Gravity, Urine: 1.01
Urobilinogen, Ur: NEGATIVE
pH, UA: 6.5 (ref 5.0–8.0)

## 2022-07-07 MED ORDER — VALACYCLOVIR HCL 1 G PO TABS: ORAL_TABLET | ORAL | 2 refills | Status: DC

## 2022-07-07 MED ORDER — TADALAFIL 20 MG PO TABS: 20.0000 mg | ORAL_TABLET | Freq: Every day | ORAL | 11 refills | Status: DC

## 2022-07-07 MED ORDER — PANTOPRAZOLE SODIUM 40 MG PO TBEC: 40.0000 mg | DELAYED_RELEASE_TABLET | Freq: Every day | ORAL | 3 refills | Status: DC

## 2022-07-07 NOTE — Progress Notes (Signed)
Subjective:   HPI  Cody Tapia is a 56 y.o. male who presents for Chief Complaint  Patient presents with   fasting cpe    Fasting cpe, had water with Liquid IV that has like a salty thing 4-5 hours ago. Needs refills on valtrex    Patient Care Team: Oley Lahaie, Camelia Eng, PA-C as PCP - General (Family Medicine) Sees dentist Sees eye doctor  Concerns: Ongoing chronic issues with rectal pain, hx/o small fissure causing ongoing issues.  Has had botox at the anus.   Has to rely on Linzess and fiber nightly.  Had botox by surgeon Dr. Shana Chute at [facility].  Has repeat chest CT for recheck on chest CT tomorrow.  Fasting but drank a "liquid IV " this morning   Reviewed their medical, surgical, family, social, medication, and allergy history and updated chart as appropriate.  Past Medical History:  Diagnosis Date   23-polyvalent pneumococcal polysaccharide vaccine declined 7/14   Anxiety    prior therapy with counselor, high stress job, 12+ hour work days   Asthma    ED (erectile dysfunction)    Former smoker 2021   GERD (gastroesophageal reflux disease)    pulmonology consult 2020   Hypercholesteremia    Hyperglycemia    Hypertension    Obesity    Thoracic ascending aortic aneurysm ([place])    Wears glasses     Past Surgical History:  Procedure Laterality Date   COLONOSCOPY  2009   [facility] for abdominal pain   COLONOSCOPY  12/2018   polyps TA, diverticulosis, Dr. Wilfrid Lund   ESOPHAGOGASTRODUODENOSCOPY  2009   [facility], abdominal pain   EYE SURGERY     strabismus surgery, right   WISDOM TOOTH EXTRACTION      Family History  Problem Relation Age of Onset   Heart disease Father 8       stent, CAD   Stroke Father    COPD Mother    Sudden death Neg Hx    Heart attack Neg Hx    Hyperlipidemia Neg Hx    Diabetes Neg Hx    Hypertension Neg Hx    Cancer Neg Hx      Current Outpatient Medications:    irbesartan (AVAPRO) 150 MG tablet, Take 1  tablet (150 mg total) by mouth daily., Disp: 90 tablet, Rfl: 3   Multiple Vitamin (MULTIVITAMIN) tablet, Take 1 tablet by mouth daily., Disp: , Rfl:    rosuvastatin (CRESTOR) 10 MG tablet, Take 1 tablet by mouth once daily, Disp: 30 tablet, Rfl: 2   budesonide-formoterol (SYMBICORT) 160-4.5 MCG/ACT inhaler, Inhale 2 puffs into the lungs 2 (two) times daily. (Patient not taking: Reported on 07/07/2022), Disp: 1 each, Rfl: 3   pantoprazole (PROTONIX) 40 MG tablet, Take 1 tablet (40 mg total) by mouth daily., Disp: 90 tablet, Rfl: 3   tadalafil (CIALIS) 20 MG tablet, Take 1 tablet (20 mg total) by mouth daily. as directed, Disp: 8 tablet, Rfl: 11   valACYclovir (VALTREX) 1000 MG tablet, 2 tablets po BID x 2 days for flare up, Disp: 30 tablet, Rfl: 2  Allergies  Allergen Reactions   Augmentin [Amoxicillin-Pot Clavulanate]     Bad diarrhea/GI upset   Qsymia [Phentermine-Topiramate]     Diarrhea      Review of Systems Constitutional: -fever, -chills, -sweats, -unexpected weight change, -decreased appetite, -fatigue Allergy: -sneezing, -itching, -congestion Dermatology: -changing moles, --rash, -lumps ENT: -runny nose, -ear pain, -sore throat, -hoarseness, -sinus pain, -teeth pain, - ringing  in ears, -hearing loss, -nosebleeds Cardiology: -chest pain, -palpitations, -swelling, -difficulty breathing when lying flat, -waking up short of breath Respiratory: -cough, -shortness of breath, -difficulty breathing with exercise or exertion, -wheezing, -coughing up blood Gastroenterology: -abdominal pain, -nausea, -vomiting, -diarrhea, +constipation, -blood in stool, -changes in bowel movement, -difficulty swallowing or eating Hematology: -bleeding, -bruising  Musculoskeletal: -joint aches, -muscle aches, -joint swelling, -back pain, -neck pain, -cramping, -changes in gait Ophthalmology: denies vision changes, eye redness, itching, discharge Urology: -burning with urination, -difficulty urinating, -blood  in urine, -urinary frequency, -urgency, -incontinence Neurology: -headache, -weakness, -tingling, -numbness, -memory loss, -falls, -dizziness Psychology: -depressed mood, -agitation, -sleep problems Male GU: no testicular mass, pain, no lymph nodes swollen, no swelling, no rash.     07/07/2022    2:58 PM 04/23/2021    9:25 AM 01/10/2020    8:50 AM 03/07/2018    2:02 PM 01/14/2017    8:26 AM  Depression screen PHQ 2/9  Decreased Interest 0 0 0 0 0  Down, Depressed, Hopeless 0 1 0 0 0  PHQ - 2 Score 0 1 0 0 0  Altered sleeping  1     Tired, decreased energy  2     Change in appetite  2     Feeling bad or failure about yourself   2     Trouble concentrating  0     Moving slowly or fidgety/restless  0     Suicidal thoughts  0     PHQ-9 Score  8     Difficult doing work/chores  Not difficult at all           Objective:  BP 120/70   Pulse 75   Ht '5\' 10"'$  (1.778 m)   Wt 220 lb 3.2 oz (99.9 kg)   BMI 31.60 kg/m   General appearance: alert, no distress, WD/WN, Caucasian male Skin: unremarkable HEENT: normocephalic, conjunctiva/corneas normal, sclerae anicteric, PERRLA, EOMi, nares patent, no discharge or erythema, pharynx normal Oral cavity: MMM, tongue normal, teeth normal Neck: supple, no lymphadenopathy, no thyromegaly, no masses, normal ROM, no bruits Chest: non tender, normal shape and expansion Heart: RRR, normal S1, S2, no murmurs Lungs: CTA bilaterally, no wheezes, rhonchi, or rales Abdomen: +bs, soft, non tender, non distended, no masses, no hepatomegaly, no splenomegaly, no bruits Back: non tender, normal ROM, no scoliosis Musculoskeletal: upper extremities non tender, no obvious deformity, normal ROM throughout, lower extremities non tender, no obvious deformity, normal ROM throughout Extremities: no edema, no cyanosis, no clubbing Pulses: 2+ symmetric, upper and lower extremities, normal cap refill Neurological: alert, oriented x 3, CN2-12 intact, strength normal upper  extremities and lower extremities, sensation normal throughout, DTRs 2+ throughout, no cerebellar signs, gait normal Psychiatric: normal affect, behavior normal, pleasant  GU: normal male external genitalia,circumcised, nontender, no masses, no hernia, no lymphadenopathy Rectal: deferred   Assessment and Plan :   Encounter Diagnoses  Name Primary?   Routine general medical examination at a health care facility Yes   Hypercholesteremia    Gastroesophageal reflux disease without esophagitis    Vaccine counseling    Anxiety    Astigmatism, unspecified laterality, unspecified type    Chronic chest pain    Cold sore    Erectile dysfunction, unspecified erectile dysfunction type    Extrinsic asthma without complication, unspecified asthma severity, unspecified whether persistent    Fatigue, unspecified type    Gastroesophageal reflux disease, unspecified whether esophagitis present    Former smoker    History of prostatitis  Hyperlipidemia, unspecified hyperlipidemia type    Primary hypertension    Mild persistent asthma without complication    Polyp of colon, unspecified part of colon, unspecified type    Rectal pain    Screening for prostate cancer    Aneurysm of ascending aorta without rupture (HCC)    Upper airway cough syndrome    History of anal fissures     This visit was a preventative care visit, also known as wellness visit or routine physical.   Topics typically include healthy lifestyle, diet, exercise, preventative care, vaccinations, sick and well care, proper use of emergency dept and after hours care, as well as other concerns.     Recommendations: Continue to return yearly for your annual wellness and preventative care visits.  This gives [place] a chance to discuss healthy lifestyle, exercise, vaccinations, review your chart record, and perform screenings where appropriate.  I recommend you see your eye doctor yearly for routine vision care.  I recommend you see  your dentist yearly for routine dental care including hygiene visits twice yearly.   Vaccination recommendations were reviewed Immunization History  Administered Date(s) Administered   Influenza,inj,Quad PF,6+ Mos 10/18/2018, 12/13/2019   PFIZER Comirnaty(Gray Top)Covid-19 Tri-Sucrose Vaccine 04/23/2021   PFIZER(Purple Top)SARS-COV-2 Vaccination 01/31/2020, 02/21/2020, 09/20/2020   Pneumococcal Polysaccharide-23 01/14/2017   Tdap 06/21/2013    Consider Pneumococcal 23 updated  Shingles vaccine:  I recommend you have a shingles vaccine to help prevent shingles or herpes zoster outbreak.   Please call your insurer to inquire about coverage for the Shingrix vaccine given in 2 doses.   Some insurers cover this vaccine after age 82, some cover this after age 12.  If your insurer covers this, then call to schedule appointment to have this vaccine here.    Screening for cancer: Colon cancer screening: I reviewed your colonoscopy on file that is up to date from 2020  We discussed PSA, prostate exam, and prostate cancer screening risks/benefits.     Skin cancer screening: Check your skin regularly for new changes, growing lesions, or other lesions of concern Come in for evaluation if you have skin lesions of concern.  Lung cancer screening: If you have a greater than 20 pack year history of tobacco use, then you may qualify for lung cancer screening with a chest CT scan.   Please call your insurance company to inquire about coverage for this test.  We currently don't have screenings for other cancers besides breast, cervical, colon, and lung cancers.  If you have a strong family history of cancer or have other cancer screening concerns, please let me know.    Bone health: Get at least 150 minutes of aerobic exercise weekly Get weight bearing exercise at least once weekly Bone density test:  A bone density test is an imaging test that uses a type of X-Evertt to measure the amount of calcium  and other minerals in your bones. The test may be used to diagnose or screen you for a condition that causes weak or thin bones (osteoporosis), predict your risk for a broken bone (fracture), or determine how well your osteoporosis treatment is working. The bone density test is recommended for females 18 and older, or females or males <97 if certain risk factors such as thyroid disease, long term use of steroids such as for asthma or rheumatological issues, vitamin D deficiency, estrogen deficiency, family history of osteoporosis, self or family history of fragility fracture in first degree relative.    Heart health: Get  at least 150 minutes of aerobic exercise weekly Limit alcohol It is important to maintain a healthy blood pressure and healthy cholesterol numbers  Heart disease screening: Screening for heart disease includes screening for blood pressure, fasting lipids, glucose/diabetes screening, BMI height to weight ratio, reviewed of smoking status, physical activity, and diet.    Goals include blood pressure 120/80 or less, maintaining a healthy lipid/cholesterol profile, preventing diabetes or keeping diabetes numbers under good control, not smoking or using tobacco products, exercising most days per week or at least 150 minutes per week of exercise, and eating healthy variety of fruits and vegetables, healthy oils, and avoiding unhealthy food choices like fried food, fast food, high sugar and high cholesterol foods.     Medical care options: I recommend you continue to seek care here first for routine care.  We try really hard to have available appointments Monday through Friday daytime hours for sick visits, acute visits, and physicals.  Urgent care should be used for after hours and weekends for significant issues that cannot wait till the next day.  The emergency department should be used for significant potentially life-threatening emergencies.  The emergency department is expensive,  can often have long wait times for less significant concerns, so try to utilize primary care, urgent care, or telemedicine when possible to avoid unnecessary trips to the emergency department.  Virtual visits and telemedicine have been introduced since the pandemic started in 2020, and can be convenient ways to receive medical care.  We offer virtual appointments as well to assist you in a variety of options to seek medical care.   Advanced Directives: I recommend you consider completing a [place] and Living Will.   These documents respect your wishes and help alleviate burdens on your loved ones if you were to become terminally ill or be in a position to need those documents enforced.    You can complete Advanced Directives yourself, have them notarized, then have copies made for our office, for you and for anybody you feel should have them in safe keeping.  Or, you can have an attorney prepare these documents.   If you haven't updated your Last Will and Testament in a while, it may be worthwhile having an attorney prepare these documents together and save on some costs.       Separate significant issues discussed: Continue current medications, doing relatively good on current therapies.  Continue plan for CT chest for updated aneurysm surveillance tomorrow  Referral to Integrative Therapy for other ideas in helping chronic rectal pain.  Follow with surgeon as well for consideration of repeat botox.  Continue Linzess for now     Deverick was seen today for fasting cpe.  Diagnoses and all orders for this visit:  Routine general medical examination at a health care facility -     POCT Urinalysis DIP (Proadvantage Device) -     Comprehensive metabolic panel -     CBC -     VITAMIN D 25 Hydroxy (Vit-D Deficiency, Fractures) -     Lipid panel -     PSA -     Hepatitis C antibody  Hypercholesteremia  Gastroesophageal reflux disease without esophagitis -      pantoprazole (PROTONIX) 40 MG tablet; Take 1 tablet (40 mg total) by mouth daily.  Vaccine counseling  Anxiety  Astigmatism, unspecified laterality, unspecified type  Chronic chest pain  Cold sore  Erectile dysfunction, unspecified erectile dysfunction type  Extrinsic asthma without complication,  unspecified asthma severity, unspecified whether persistent  Fatigue, unspecified type  Gastroesophageal reflux disease, unspecified whether esophagitis present  Former smoker  History of prostatitis  Hyperlipidemia, unspecified hyperlipidemia type -     Lipid panel  Primary hypertension  Mild persistent asthma without complication  Polyp of colon, unspecified part of colon, unspecified type  Rectal pain  Screening for prostate cancer -     PSA  Aneurysm of ascending aorta without rupture (HCC)  Upper airway cough syndrome  History of anal fissures  Other orders -     tadalafil (CIALIS) 20 MG tablet; Take 1 tablet (20 mg total) by mouth daily. as directed -     valACYclovir (VALTREX) 1000 MG tablet; 2 tablets po BID x 2 days for flare up    Follow-up pending labs, yearly for physical

## 2022-07-08 ENCOUNTER — Ambulatory Visit
Admission: RE | Admit: 2022-07-08 | Discharge: 2022-07-08 | Disposition: A | Discharge status: Home / Self Care | Payer: BC Managed Care – PPO | Source: Ambulatory Visit | Attending: Cardiology | Admitting: Cardiology

## 2022-07-08 ENCOUNTER — Other Ambulatory Visit: Payer: Self-pay | Admitting: Internal Medicine

## 2022-07-08 DIAGNOSIS — I712 Thoracic aortic aneurysm, without rupture, unspecified: Secondary | ICD-10-CM | POA: Diagnosis not present

## 2022-07-08 DIAGNOSIS — J8410 Pulmonary fibrosis, unspecified: Secondary | ICD-10-CM | POA: Diagnosis not present

## 2022-07-08 DIAGNOSIS — I10 Essential (primary) hypertension: Secondary | ICD-10-CM

## 2022-07-08 DIAGNOSIS — K6289 Other specified diseases of anus and rectum: Secondary | ICD-10-CM

## 2022-07-08 DIAGNOSIS — I7121 Aneurysm of the ascending aorta, without rupture: Secondary | ICD-10-CM

## 2022-07-08 DIAGNOSIS — K594 Anal spasm: Secondary | ICD-10-CM

## 2022-07-08 DIAGNOSIS — R918 Other nonspecific abnormal finding of lung field: Secondary | ICD-10-CM | POA: Diagnosis not present

## 2022-07-08 LAB — COMPREHENSIVE METABOLIC PANEL
ALT: 29 IU/L (ref 0–44)
AST: 28 IU/L (ref 0–40)
Albumin/Globulin Ratio: 1.8 (ref 1.2–2.2)
Albumin: 4.6 g/dL (ref 3.8–4.9)
Alkaline Phosphatase: 123 IU/L — ABNORMAL HIGH (ref 44–121)
BUN/Creatinine Ratio: 15 (ref 9–20)
BUN: 15 mg/dL (ref 6–24)
Bilirubin Total: 0.4 mg/dL (ref 0.0–1.2)
CO2: 17 mmol/L — ABNORMAL LOW (ref 20–29)
Calcium: 9.4 mg/dL (ref 8.7–10.2)
Chloride: 102 mmol/L (ref 96–106)
Creatinine, Ser: 0.97 mg/dL (ref 0.76–1.27)
Globulin, Total: 2.5 g/dL (ref 1.5–4.5)
Glucose: 88 mg/dL (ref 70–99)
Potassium: 4.1 mmol/L (ref 3.5–5.2)
Sodium: 136 mmol/L (ref 134–144)
Total Protein: 7.1 g/dL (ref 6.0–8.5)
eGFR: 93 mL/min/{1.73_m2} (ref >59)

## 2022-07-08 LAB — CBC
Hematocrit: 45.2 % (ref 37.5–51.0)
Hemoglobin: 15.6 g/dL (ref 13.0–17.7)
MCH: 31.5 pg (ref 26.6–33.0)
MCHC: 34.5 g/dL (ref 31.5–35.7)
MCV: 91 fL (ref 79–97)
Platelets: 260 10*3/uL (ref 150–450)
RBC: 4.96 x10E6/uL (ref 4.14–5.80)
RDW: 13.2 % (ref 11.6–15.4)
WBC: 9.9 10*3/uL (ref 3.4–10.8)

## 2022-07-08 LAB — VITAMIN D 25 HYDROXY (VIT D DEFICIENCY, FRACTURES): Vit D, 25-Hydroxy: 33.5 ng/mL (ref 30.0–100.0)

## 2022-07-08 LAB — LIPID PANEL
Chol/HDL Ratio: 3.2 ratio (ref 0.0–5.0)
Cholesterol, Total: 171 mg/dL (ref 100–199)
HDL: 53 mg/dL (ref >39)
LDL Chol Calc (NIH): 96 mg/dL (ref 0–99)
Triglycerides: 126 mg/dL (ref 0–149)
VLDL Cholesterol Cal: 22 mg/dL (ref 5–40)

## 2022-07-08 LAB — HEPATITIS C ANTIBODY: Hep C Virus Ab: NONREACTIVE

## 2022-07-08 LAB — PSA: Prostate Specific Ag, Serum: 0.4 ng/mL (ref 0.0–4.0)

## 2022-07-08 MED ORDER — IOPAMIDOL (ISOVUE-370) INJECTION 76%
100.0000 mL | Freq: Once | INTRAVENOUS | Status: AC | PRN
  Administered 2022-07-08: 75 mL via INTRAVENOUS

## 2022-07-08 NOTE — Progress Notes (Signed)
CT angiogram chest 07/08/2022: 4.0 cm ascending thoracic aortic aneurysm is noted on this ungated exam. Recommend annual imaging followup by CTA or MRA.  10/10/2020.

## 2022-07-08 NOTE — Progress Notes (Signed)
CT angiogram chest 07/08/2022: 4.0 cm ascending thoracic aortic aneurysm is noted on this ungated exam. Recommend annual imaging followup by CTA or MRA. No change from 10/10/2020.

## 2022-07-23 ENCOUNTER — Other Ambulatory Visit: Payer: Self-pay | Admitting: Medical

## 2022-07-23 ENCOUNTER — Telehealth: Payer: Self-pay

## 2022-07-23 MED ORDER — MELOXICAM 15 MG PO TABS: 15.0000 mg | ORAL_TABLET | Freq: Every day | ORAL | 1 refills | Status: DC

## 2022-07-23 NOTE — Telephone Encounter (Signed)
Pt. Called stating that his shoulder has flared up again and he wanted to have an anti inflammatory sent in. He said you have sent him in one in the past for his shoulder. I did let him know  that we usually do not call in any medicine without you being seen for the issue first. He still insisted that I ask you and that if not he guessed he would have to call ortho again and go through all that again.

## 2022-07-29 ENCOUNTER — Encounter: Payer: Self-pay | Admitting: Internal Medicine

## 2022-08-06 DIAGNOSIS — A630 Anogenital (venereal) warts: Secondary | ICD-10-CM | POA: Diagnosis not present

## 2022-08-06 DIAGNOSIS — D485 Neoplasm of uncertain behavior of skin: Secondary | ICD-10-CM | POA: Diagnosis not present

## 2022-08-06 DIAGNOSIS — L820 Inflamed seborrheic keratosis: Secondary | ICD-10-CM | POA: Diagnosis not present

## 2022-08-06 DIAGNOSIS — L720 Epidermal cyst: Secondary | ICD-10-CM | POA: Diagnosis not present

## 2022-08-11 DIAGNOSIS — S46812A Strain of other muscles, fascia and tendons at shoulder and upper arm level, left arm, initial encounter: Secondary | ICD-10-CM | POA: Diagnosis not present

## 2022-09-01 ENCOUNTER — Encounter: Payer: Self-pay | Admitting: Internal Medicine

## 2022-10-01 ENCOUNTER — Other Ambulatory Visit: Payer: Self-pay | Admitting: Medical

## 2022-10-01 DIAGNOSIS — E7800 Pure hypercholesterolemia, unspecified: Secondary | ICD-10-CM

## 2022-10-30 ENCOUNTER — Encounter: Payer: Self-pay | Admitting: Medical

## 2022-10-30 ENCOUNTER — Ambulatory Visit: Payer: BC Managed Care – PPO | Admitting: Medical

## 2022-10-30 VITALS — BP 138/88 | HR 77 | Temp 97.6°F | Wt 230.4 lb

## 2022-10-30 DIAGNOSIS — Z87891 Personal history of nicotine dependence: Secondary | ICD-10-CM | POA: Diagnosis not present

## 2022-10-30 DIAGNOSIS — J4530 Mild persistent asthma, uncomplicated: Secondary | ICD-10-CM | POA: Diagnosis not present

## 2022-10-30 DIAGNOSIS — J988 Other specified respiratory disorders: Secondary | ICD-10-CM

## 2022-10-30 MED ORDER — BUDESONIDE-FORMOTEROL FUMARATE 160-4.5 MCG/ACT IN AERO: 2.0000 | INHALATION_SPRAY | Freq: Two times a day (BID) | RESPIRATORY_TRACT | 3 refills | Status: DC

## 2022-10-30 MED ORDER — ALBUTEROL SULFATE HFA 108 (90 BASE) MCG/ACT IN AERS: 2.0000 | INHALATION_SPRAY | Freq: Four times a day (QID) | RESPIRATORY_TRACT | 1 refills | Status: DC | PRN

## 2022-10-30 MED ORDER — PREDNISONE 20 MG PO TABS: 40.0000 mg | ORAL_TABLET | Freq: Every day | ORAL | 0 refills | Status: DC

## 2022-10-30 MED ORDER — ALBUTEROL SULFATE (2.5 MG/3ML) 0.083% IN NEBU: 2.5000 mg | INHALATION_SOLUTION | Freq: Four times a day (QID) | RESPIRATORY_TRACT | 1 refills | Status: DC | PRN

## 2022-10-30 MED ORDER — DOXYCYCLINE HYCLATE 100 MG PO TABS: 100.0000 mg | ORAL_TABLET | Freq: Two times a day (BID) | ORAL | 0 refills | Status: DC

## 2022-10-30 NOTE — Patient Instructions (Signed)
Recommendations: You can use Mucinex DM over-the-counter for the next few days for cough and congestion and mucus Begin round of doxycycline antibiotic twice a day for 1 week Begin 3 days of prednisone steroid to reduce inflammation in the lungs You can continue Symbicort 2 puffs twice a day for the next couple weeks You can use albuterol rescue medication for wheezing, cough, shortness of breath or congestion in the chest.  You can do either the handheld or the nebulized therapy.  I sent both to the pharmacy today so that he have a supply of this If not improving over the next 4 to 5 days then let me know If any medication is not covered by insurance or too expensive let me know

## 2022-10-30 NOTE — Progress Notes (Signed)
Subjective:  Cody Tapia is a 56 y.o. male who presents for Chief Complaint  Patient presents with   Head and chest congestion    Patient reports having head and chest congestion x 2 weeks. Home COVID test was negative yesterday.     Here for > 2 weeks of head and chest congestion, sometimes sneezing, sometimes productive nasal discahrge, having some wheezing, some crackling sounds.   No fever, no body aches or chills, but feels some run down, malaise.  Has used some alka seltzer,   has used symbicor some in the past week.    Has a nebulizer at home but hasn't used it.  No other aggravating or relieving factors.    No other c/o.  Past Medical History:  Diagnosis Date   23-polyvalent pneumococcal polysaccharide vaccine declined 7/14   Anxiety    prior therapy with counselor, high stress job, 12+ hour work days   Asthma    ED (erectile dysfunction)    Former smoker 2021   GERD (gastroesophageal reflux disease)    pulmonology consult 2020   Hypercholesteremia    Hyperglycemia    Hypertension    Obesity    Thoracic ascending aortic aneurysm ([place])    Wears glasses    Current Outpatient Medications on File Prior to Visit  Medication Sig Dispense Refill   irbesartan (AVAPRO) 150 MG tablet Take 1 tablet (150 mg total) by mouth daily. 90 tablet 3   meloxicam (MOBIC) 15 MG tablet Take 1 tablet (15 mg total) by mouth daily. 30 tablet 1   Multiple Vitamin (MULTIVITAMIN) tablet Take 1 tablet by mouth daily.     pantoprazole (PROTONIX) 40 MG tablet Take 1 tablet (40 mg total) by mouth daily. 90 tablet 3   rosuvastatin (CRESTOR) 10 MG tablet Take 1 tablet by mouth once daily 30 tablet 2   tadalafil (CIALIS) 20 MG tablet Take 1 tablet (20 mg total) by mouth daily. as directed 8 tablet 11   valACYclovir (VALTREX) 1000 MG tablet 2 tablets po BID x 2 days for flare up 30 tablet 2   No current facility-administered medications on file prior to visit.     The following portions of the  patient's history were reviewed and updated as appropriate: allergies, current medications, past family history, past medical history, past social history, past surgical history and problem list.  ROS Otherwise as in subjective above  Objective: BP 138/88   Pulse 77   Temp 97.6 F (36.4 C) (Oral)   Wt 230 lb 6.4 oz (104.5 kg)   SpO2 98% Comment: room air  BMI 33.06 kg/m   General appearance: alert, no distress, well developed, well nourished HEENT: normocephalic, sclerae anicteric, conjunctiva pink and moist, TMs pearly, nares patent, no discharge or erythema, pharynx normal Oral cavity: MMM, no lesions Neck: supple, no lymphadenopathy, no thyromegaly, no masses Heart: RRR, normal S1, S2, no murmurs Lungs: few expiratory wheezes, no rhonchi, or rales Pulses: 2+ radial pulses, 2+ pedal pulses, normal cap refill Ext: no edema   Assessment: Encounter Diagnoses  Name Primary?   Mild persistent asthma without complication Yes   Former smoker    Respiratory tract infection      Plan: We discussed symptoms and concerns.  Exam fairly unremarkable other than some wheeze mild on exam today.  Given that in the past he has had some significant difficulty getting a respiratory tract infection and worsening of flareup of asthma we will use the regimen below.  Patient Instructions  Recommendations: You can use Mucinex DM over-the-counter for the next few days for cough and congestion and mucus Begin round of doxycycline antibiotic twice a day for 1 week Begin 3 days of prednisone steroid to reduce inflammation in the lungs You can continue Symbicort 2 puffs twice a day for the next couple weeks You can use albuterol rescue medication for wheezing, cough, shortness of breath or congestion in the chest.  You can do either the handheld or the nebulized therapy.  I sent both to the pharmacy today so that he have a supply of this If not improving over the next 4 to 5 days then let me know If  any medication is not covered by insurance or too expensive let me know   Long was seen today for head and chest congestion.  Diagnoses and all orders for this visit:  Mild persistent asthma without complication  Former smoker  Respiratory tract infection  Other orders -     budesonide-formoterol (SYMBICORT) 160-4.5 MCG/ACT inhaler; Inhale 2 puffs into the lungs 2 (two) times daily. -     doxycycline (VIBRA-TABS) 100 MG tablet; Take 1 tablet (100 mg total) by mouth 2 (two) times daily. -     albuterol (VENTOLIN HFA) 108 (90 Base) MCG/ACT inhaler; Inhale 2 puffs into the lungs every 6 (six) hours as needed for wheezing or shortness of breath. -     albuterol (PROVENTIL) (2.5 MG/3ML) 0.083% nebulizer solution; Take 3 mLs (2.5 mg total) by nebulization every 6 (six) hours as needed for wheezing or shortness of breath. -     predniSONE (DELTASONE) 20 MG tablet; Take 2 tablets (40 mg total) by mouth daily with breakfast.    Follow up: prn

## 2022-11-02 ENCOUNTER — Ambulatory Visit: Payer: BC Managed Care – PPO | Admitting: Cardiology

## 2022-11-09 ENCOUNTER — Encounter: Payer: Self-pay | Admitting: Cardiology

## 2022-11-09 ENCOUNTER — Ambulatory Visit: Payer: BC Managed Care – PPO | Admitting: Cardiology

## 2022-11-09 VITALS — BP 126/85 | HR 82 | Resp 16 | Ht 70.0 in | Wt 232.8 lb

## 2022-11-09 DIAGNOSIS — I10 Essential (primary) hypertension: Secondary | ICD-10-CM | POA: Diagnosis not present

## 2022-11-09 DIAGNOSIS — E7800 Pure hypercholesterolemia, unspecified: Secondary | ICD-10-CM | POA: Diagnosis not present

## 2022-11-09 DIAGNOSIS — I7121 Aneurysm of the ascending aorta, without rupture: Secondary | ICD-10-CM | POA: Diagnosis not present

## 2022-11-09 NOTE — Progress Notes (Signed)
Primary Physician/Referring:  Carlena Hurl, PA-C  Patient ID: Cody Tapia, male    DOB: 05/30/1966, 56 y.o.   MRN: [id number]  Chief Complaint  Patient presents with   Thoracic Aortic Aneurysm   Hypertension   Follow-up    6 months   HPI:    Cody Tapia  is a 56 y.o. male  with chronic tobacco use quit in 2020, mild obesity, hyperlipidemia, erectile dysfunction, CT scan of the chest on 11/18/2018 demonstrated mid ascending thoracic aorta measuring 38 to 40 mm indicating fusiform dilatation, no coronary artery calcification, but found calcified granulomas in the right upper lobe, azygos lobe, and right hilum-mediastinum.   Patient presents for annual follow up.  He has remained abstinent from tobacco use.  He has been trying to exercise and be physically active as much as possible.  He has no specific complaints today.  He had lost about 25 pounds in weight a year ago, but he has gained 15 pounds weight back.  Past Medical History:  Diagnosis Date   23-polyvalent pneumococcal polysaccharide vaccine declined 7/14   Anxiety    prior therapy with counselor, high stress job, 12+ hour work days   Asthma    ED (erectile dysfunction)    Former smoker 2021   GERD (gastroesophageal reflux disease)    pulmonology consult 2020   Hypercholesteremia    Hyperglycemia    Hypertension    Obesity    Thoracic ascending aortic aneurysm ([place])    Wears glasses    Past Surgical History:  Procedure Laterality Date   COLONOSCOPY  2009   [facility] for abdominal pain   COLONOSCOPY  12/2018   polyps TA, diverticulosis, Dr. Wilfrid Lund   ESOPHAGOGASTRODUODENOSCOPY  2009   [facility], abdominal pain   EYE SURGERY     strabismus surgery, right   WISDOM TOOTH EXTRACTION     Social History   Tobacco Use   Smoking status: Former    Packs/day: 0.25    Years: 30.00    Total pack years: 7.50    Types: Cigarettes    Quit date: 10/14/2019    Years since quitting: 3.0    Smokeless tobacco: Never  Substance Use Topics   Alcohol use: Yes    Alcohol/week: 3.0 standard drinks of alcohol    Types: 3 Glasses of wine per week   Marital Status: Single  ROS  Review of Systems  Cardiovascular:  Negative for chest pain, dyspnea on exertion and leg swelling.  Gastrointestinal:  Negative for melena.   Objective  Blood pressure 126/85, pulse 82, resp. rate 16, height _0  (1.778 m), weight 232 lb 12.8 oz (105.6 kg), SpO2 96 %.     11/09/2022    1:53 PM 10/30/2022   11:12 AM 07/07/2022    2:58 PM  Vitals with BMI  Height _1   _2   Weight 232 lbs 13 oz 230 lbs 6 oz 220 lbs 3 oz  BMI 40.9  81.1  Systolic [phone]  Diastolic 85 88 70  Pulse 82 77 75    Physical Exam Neck:     Vascular: No carotid bruit or JVD.  Cardiovascular:     Rate and Rhythm: Normal rate and regular rhythm.     Pulses: Intact distal pulses.     Heart sounds: Normal heart sounds. No murmur heard.    No gallop.  Pulmonary:     Effort: Pulmonary effort is normal.     Breath  sounds: Normal breath sounds.  Abdominal:     General: Bowel sounds are normal.     Palpations: Abdomen is soft.  Musculoskeletal:     Right lower leg: No edema.     Left lower leg: No edema.    Laboratory examination:   Lab Results  Component Value Date   NA 136 07/07/2022   K 4.1 07/07/2022   CO2 17 (L) 07/07/2022   GLUCOSE 88 07/07/2022   BUN 15 07/07/2022   CREATININE 0.97 07/07/2022   CALCIUM 9.4 07/07/2022   EGFR 93 07/07/2022   GFRNONAA 75 06/20/2020       Latest Ref Rng & Units 07/07/2022    3:36 PM 04/23/2021   10:30 AM 06/20/2020   10:49 AM  CMP  Glucose 70 - 99 mg/dL 88  83  99   BUN 6 - 24 mg/dL _0 Creatinine 0.76 - 1.27 mg/dL 0.97  1.08  1.13   Sodium 134 - 144 mmol/L 136  138  137   Potassium 3.5 - 5.2 mmol/L 4.1  4.4  4.6   Chloride 96 - 106 mmol/L 102  102  102   CO2 20 - 29 mmol/L _1 Calcium 8.7 - 10.2 mg/dL 9.4  9.3  9.4   Total Protein 6.0 - 8.5  g/dL 7.1  7.0  7.2   Total Bilirubin 0.0 - 1.2 mg/dL 0.4  0.6  0.7   Alkaline Phos 44 - 121 IU/L 123  121  126   AST 0 - 40 IU/L _2 ALT 0 - 44 IU/L 29  32  26       Latest Ref Rng & Units 07/07/2022    3:36 PM 04/23/2021   10:30 AM 01/10/2020    9:19 AM  CBC  WBC 3.4 - 10.8 x10E3/uL 9.9  14.3  6.9   Hemoglobin 13.0 - 17.7 g/dL 15.6  15.5  15.9   Hematocrit 37.5 - 51.0 % 45.2  45.3  46.5   Platelets 150 - 450 x10E3/uL 260  350  318     Lab Results  Component Value Date   CHOL 171 07/07/2022   HDL 53 07/07/2022   LDLCALC 96 07/07/2022   TRIG 126 07/07/2022   CHOLHDL 3.2 07/07/2022     HEMOGLOBIN A1C Lab Results  Component Value Date   HGBA1C 5.6 01/10/2020   MPG 105 01/14/2017   Lab Results  Component Value Date   TSH 1.710 01/10/2020     Medications and allergies   Allergies  Allergen Reactions   Augmentin [Amoxicillin-Pot Clavulanate]     Bad diarrhea/GI upset   Qsymia [Phentermine-Topiramate]     Diarrhea      Current Outpatient Medications:    albuterol (PROVENTIL) (2.5 MG/3ML) 0.083% nebulizer solution, Take 3 mLs (2.5 mg total) by nebulization every 6 (six) hours as needed for wheezing or shortness of breath., Disp: 60 mL, Rfl: 1   albuterol (VENTOLIN HFA) 108 (90 Base) MCG/ACT inhaler, Inhale 2 puffs into the lungs every 6 (six) hours as needed for wheezing or shortness of breath., Disp: 18 g, Rfl: 1   budesonide-formoterol (SYMBICORT) 160-4.5 MCG/ACT inhaler, Inhale 2 puffs into the lungs 2 (two) times daily., Disp: 1 each, Rfl: 3   irbesartan (AVAPRO) 150 MG tablet, Take 1 tablet (150 mg total) by mouth daily., Disp: 90 tablet, Rfl: 3   pantoprazole (PROTONIX) 40 MG tablet, Take 1 tablet (  40 mg total) by mouth daily., Disp: 90 tablet, Rfl: 3   rosuvastatin (CRESTOR) 10 MG tablet, Take 1 tablet by mouth once daily, Disp: 30 tablet, Rfl: 2   tadalafil (CIALIS) 20 MG tablet, Take 1 tablet (20 mg total) by mouth daily. as directed, Disp: 8 tablet, Rfl:  11   valACYclovir (VALTREX) 1000 MG tablet, 2 tablets po BID x 2 days for flare up, Disp: 30 tablet, Rfl: 2   Radiology:   CT angiogram chest 07/08/2022: 4.0 cm ascending thoracic aortic aneurysm is noted on this ungated exam. Recommend annual imaging followup by CTA or MRA. No change from 10/10/2020. Stable calcified nodules are noted in the right upper lobe most consistent with granulomas.  Cardiac Studies:   Echocardiogram 01/17/2019: Left ventricle cavity is normal in size. Mild concentric hypertrophy of the left ventricle. Normal global wall motion. Normal diastolic filling pattern. Calculated EF 51%. No significant valvular abnormalities. IVC is dilated with respiratory variation. This may suggest elevated right heart pressure   Exercise sestamibi stress test 01/06/2019: 1. The patient performed treadmill exercise using Bruce protocol, completing 9:37 minutes. The patient completed an estimated workload of 11.1 METS, reaching 92% of the maximum predicted heart rate. Exercise capacity was excellent. Hemodynamic response was normal. Stress symptoms included fatigue. No ischemic changes seen on stress electrocardiogram. 2. The overall quality of the study is excellent. There is no evidence of abnormal lung activity. Stress and rest SPECT images demonstrate homogeneous tracer distribution throughout the myocardium. Gated SPECT imaging reveals normal myocardial thickening and wall motion. The left ventricular ejection fraction was normal (66%).   3. Low risk study.   EKG   EKG 11/09/2022: Normal sinus rhythm at rate of 77 bpm, normal axis, no evidence of ischemia, normal EKG  Compared to 04/21/2022, no change.  Assessment     ICD-10-CM   1. Essential hypertension  I10 PCV ECHOCARDIOGRAM COMPLETE    2. Aneurysm of ascending aorta without rupture (HCC)  I71.21 EKG 12-Lead    PCV ECHOCARDIOGRAM COMPLETE    3. Hypercholesteremia  E78.00         No orders of the defined types were  placed in this encounter.  Medications Discontinued During This Encounter  Medication Reason   doxycycline (VIBRA-TABS) 100 MG tablet Completed Course   meloxicam (MOBIC) 15 MG tablet Patient Preference   Multiple Vitamin (MULTIVITAMIN) tablet Patient Preference   predniSONE (DELTASONE) 20 MG tablet Completed Course   Recommendations:    Cody Tapia  is a 56 y.o.  male  with chronic tobacco use quit in 2020,  hyperlipidemia, erectile dysfunction, CT scan of the chest on 11/18/2018 demonstrated mid ascending thoracic aorta measuring 38 to 40 mm indicating fusiform dilatation, no coronary artery calcification, but found calcified granulomas in the right upper lobe, azygos lobe, and right hilum-mediastinum.   1. Essential hypertension Blood pressure is slightly elevated with regard to the diastolic blood pressure.  Previously had reduced the dose of Avapro from 3 mg to 150 mg as he had lost about 25 pounds in weight.  He has gained some weight back, but he plans to lose the weight again.  He will let me know if the blood pressure remains elevated with blood pressure >130/80 mmHg.  For now continue present medications. 2. Aneurysm of ascending aorta without rupture (HCC)  Otherwise stable from cardiac standpoint, I reassured him,  the CT scan is stable, would like to repeat an echocardiogram as it has been 3 years ago, if aortic  root and ascending aorta is well-visualized, we could potentially skip radiation exposure on an annual or every other year basis.  I will see him back in a year.  3. Hypercholesteremia Lipids under excellent control.  He is already on a statin.  Continue the same.  No changes in medication needed for now.    Adrian Prows, MD, [facility] 11/09/2022, 2:17 PM Office: [phone] Pager: [phone]

## 2022-11-18 ENCOUNTER — Ambulatory Visit: Payer: BC Managed Care – PPO

## 2022-11-18 DIAGNOSIS — I7121 Aneurysm of the ascending aorta, without rupture: Secondary | ICD-10-CM | POA: Diagnosis not present

## 2022-11-18 DIAGNOSIS — I10 Essential (primary) hypertension: Secondary | ICD-10-CM

## 2022-12-14 DIAGNOSIS — K6289 Other specified diseases of anus and rectum: Secondary | ICD-10-CM | POA: Diagnosis not present

## 2022-12-14 DIAGNOSIS — K602 Anal fissure, unspecified: Secondary | ICD-10-CM | POA: Diagnosis not present

## 2022-12-14 DIAGNOSIS — K5900 Constipation, unspecified: Secondary | ICD-10-CM | POA: Diagnosis not present

## 2022-12-30 ENCOUNTER — Other Ambulatory Visit: Payer: Self-pay | Admitting: Medical

## 2022-12-30 DIAGNOSIS — E7800 Pure hypercholesterolemia, unspecified: Secondary | ICD-10-CM

## 2023-01-20 ENCOUNTER — Other Ambulatory Visit: Payer: Self-pay | Admitting: Medical

## 2023-02-03 ENCOUNTER — Ambulatory Visit: Payer: BC Managed Care – PPO | Admitting: Medical

## 2023-02-03 ENCOUNTER — Encounter: Payer: Self-pay | Admitting: Medical

## 2023-02-03 VITALS — BP 118/70 | HR 80 | Wt 232.0 lb

## 2023-02-03 DIAGNOSIS — I10 Essential (primary) hypertension: Secondary | ICD-10-CM

## 2023-02-03 DIAGNOSIS — Z6833 Body mass index (BMI) 33.0-33.9, adult: Secondary | ICD-10-CM

## 2023-02-03 DIAGNOSIS — J45909 Unspecified asthma, uncomplicated: Secondary | ICD-10-CM

## 2023-02-03 DIAGNOSIS — R635 Abnormal weight gain: Secondary | ICD-10-CM | POA: Diagnosis not present

## 2023-02-03 DIAGNOSIS — N644 Mastodynia: Secondary | ICD-10-CM | POA: Diagnosis not present

## 2023-02-03 DIAGNOSIS — E785 Hyperlipidemia, unspecified: Secondary | ICD-10-CM

## 2023-02-03 MED ORDER — ZEPBOUND 2.5 MG/0.5ML ~~LOC~~ SOAJ: 2.5000 mg | SUBCUTANEOUS | 0 refills | Status: DC

## 2023-02-03 NOTE — Progress Notes (Signed)
Subjective:  Cody Tapia is a 57 y.o. male who presents for Chief Complaint  Patient presents with   lump on left nipple    Lump on left nipple, noticed it today but had sensitivity for over a month     Here for left nipple concerns.  For a month or so has had sensitivity of left nipple.  Shaves chest and he notices it gets sensitive.   In last few days has felt a lump.  Wanted this checked out.  No redness or skin changes.  He notes his younger brother who is 8 just got diagnosed with prostate cancer, the only person in their family with prostate cancer.  Wants to start weight loss medication.  He has tried various remedies prior, has used other Publishing rights manager, has worked with Insurance claims handler.  Has not been successful.  His wife is doing well on ozempic.  He wants to try similar.   No other aggravating or relieving factors.    No other c/o.  Past Medical History:  Diagnosis Date   23-polyvalent pneumococcal polysaccharide vaccine declined 7/14   Anxiety    prior therapy with counselor, high stress job, 12+ hour work days   Asthma    ED (erectile dysfunction)    Former smoker 2021   GERD (gastroesophageal reflux disease)    pulmonology consult 2020   Hypercholesteremia    Hyperglycemia    Hypertension    Obesity    Thoracic ascending aortic aneurysm ([place])    Wears glasses    Current Outpatient Medications on File Prior to Visit  Medication Sig Dispense Refill   budesonide-formoterol (SYMBICORT) 160-4.5 MCG/ACT inhaler Inhale 2 puffs into the lungs 2 (two) times daily. 1 each 3   irbesartan (AVAPRO) 150 MG tablet Take 1 tablet (150 mg total) by mouth daily. 90 tablet 3   pantoprazole (PROTONIX) 40 MG tablet Take 1 tablet (40 mg total) by mouth daily. 90 tablet 3   rosuvastatin (CRESTOR) 10 MG tablet Take 1 tablet by mouth once daily 30 tablet 5   tadalafil (CIALIS) 20 MG tablet Take 1 tablet (20 mg total) by mouth daily. as directed 8 tablet 11   valACYclovir (VALTREX)  1000 MG tablet TAKE 2 TABLETS BY MOUTH TWICE DAILY FOR 2 DAYS FOR  FLARE  UP 30 tablet 0   albuterol (PROVENTIL) (2.5 MG/3ML) 0.083% nebulizer solution Take 3 mLs (2.5 mg total) by nebulization every 6 (six) hours as needed for wheezing or shortness of breath. (Patient not taking: Reported on 02/03/2023) 60 mL 1   albuterol (VENTOLIN HFA) 108 (90 Base) MCG/ACT inhaler Inhale 2 puffs into the lungs every 6 (six) hours as needed for wheezing or shortness of breath. (Patient not taking: Reported on 02/03/2023) 18 g 1   No current facility-administered medications on file prior to visit.   Family History  Problem Relation Age of Onset   Cancer Mother        lung   COPD Mother    Heart disease Father 17       stent, CAD   Stroke Father    Cancer Brother 36       prostate   Cancer Maternal Grandfather        prostate   Sudden death Neg Hx    Heart attack Neg Hx    Hyperlipidemia Neg Hx    Diabetes Neg Hx    Hypertension Neg Hx    The following portions of the patient's history were reviewed and updated  as appropriate: allergies, current medications, past family history, past medical history, past social history, past surgical history and problem list.  ROS Otherwise as in subjective above    Objective: BP 118/70   Pulse 80   Wt 232 lb (105.2 kg)   BMI 33.29 kg/m   General appearance: alert, no distress, well developed, well nourished Breasts and nipples without obvious skin changes, redness, nodules or other.  Nontender, no axillary lymphadenopathy Pulses: 2+ radial pulses, 2+ pedal pulses, normal cap refill Ext: no edema   Assessment: Encounter Diagnoses  Name Primary?   Nipple tenderness Yes   BMI 33.0-33.9,adult    Weight gain    Primary hypertension    Hyperlipidemia, unspecified hyperlipidemia type    Extrinsic asthma without complication, unspecified asthma severity, unspecified whether persistent      Plan: Nipple tendnerss - discussed symptoms.  Advised  moisturizing lotion BID x 1 week.  If no improvement or if ongoing symptoms or new symptoms, call back and I will order imaging.   He is agreeable at this time  Weight gain, co morbidities including HTN, lipids - begin trial of zepbound.  Discussed risks, benefits and proper use of medication in conjunction with exercise most days per week and healthy eating habits.  Cody Tapia was seen today for lump on left nipple.  Diagnoses and all orders for this visit:  Nipple tenderness  BMI 33.0-33.9,adult  Weight gain  Primary hypertension  Hyperlipidemia, unspecified hyperlipidemia type  Extrinsic asthma without complication, unspecified asthma severity, unspecified whether persistent  Other orders -     tirzepatide (ZEPBOUND) 2.5 MG/0.5ML Pen; Inject 2.5 mg into the skin once a week.   Follow up: 62mo

## 2023-02-05 ENCOUNTER — Other Ambulatory Visit: Payer: Self-pay | Admitting: Medical

## 2023-02-05 MED ORDER — WEGOVY 0.25 MG/0.5ML ~~LOC~~ SOAJ: 0.2500 mg | SUBCUTANEOUS | 0 refills | Status: DC

## 2023-02-22 ENCOUNTER — Telehealth: Payer: Self-pay

## 2023-02-22 NOTE — Telephone Encounter (Signed)
Wegovy PA approved through 08/15/23.

## 2023-03-11 ENCOUNTER — Telehealth: Payer: Self-pay | Admitting: Medical

## 2023-03-11 ENCOUNTER — Other Ambulatory Visit: Payer: Self-pay | Admitting: Medical

## 2023-03-11 MED ORDER — WEGOVY 1 MG/0.5ML ~~LOC~~ SOAJ: 1.0000 mg | SUBCUTANEOUS | 0 refills | Status: DC

## 2023-03-11 NOTE — Telephone Encounter (Signed)
Pt called and is requesting a refill for hie wegovy please send to the  [facility] - [place], [place] - [address]

## 2023-03-17 DIAGNOSIS — H00025 Hordeolum internum left lower eyelid: Secondary | ICD-10-CM | POA: Diagnosis not present

## 2023-03-22 NOTE — Telephone Encounter (Signed)
Wegovy PA approved through 08/15/23.  Per Bre's note

## 2023-03-25 DIAGNOSIS — M9902 Segmental and somatic dysfunction of thoracic region: Secondary | ICD-10-CM | POA: Diagnosis not present

## 2023-03-25 DIAGNOSIS — M542 Cervicalgia: Secondary | ICD-10-CM | POA: Diagnosis not present

## 2023-03-25 DIAGNOSIS — M62838 Other muscle spasm: Secondary | ICD-10-CM | POA: Diagnosis not present

## 2023-03-25 DIAGNOSIS — M9901 Segmental and somatic dysfunction of cervical region: Secondary | ICD-10-CM | POA: Diagnosis not present

## 2023-03-31 ENCOUNTER — Other Ambulatory Visit: Payer: Self-pay | Admitting: Medical

## 2023-04-14 ENCOUNTER — Ambulatory Visit: Payer: BC Managed Care – PPO | Admitting: Medical

## 2023-04-14 VITALS — BP 120/70 | HR 91 | Wt 224.2 lb

## 2023-04-14 DIAGNOSIS — K219 Gastro-esophageal reflux disease without esophagitis: Secondary | ICD-10-CM | POA: Diagnosis not present

## 2023-04-14 DIAGNOSIS — Z79899 Other long term (current) drug therapy: Secondary | ICD-10-CM | POA: Diagnosis not present

## 2023-04-14 DIAGNOSIS — E7800 Pure hypercholesterolemia, unspecified: Secondary | ICD-10-CM

## 2023-04-14 MED ORDER — WEGOVY 1.7 MG/0.75ML ~~LOC~~ SOAJ: 1.7000 mg | SUBCUTANEOUS | 0 refills | Status: DC

## 2023-04-14 NOTE — Progress Notes (Signed)
Subjective:  Cody Tapia is a 57 y.o. male who presents for Chief Complaint  Patient presents with   Medical Management of Chronic Issues    On weight loss medication.      Here for follow up on weight loss efforts  Current exercise: Going to gym few days per week, aerobic and weight bearing exercise.   Having some tendonitis issues, seeing chiropractor  Dietary efforts: Trying to maintain healthy diet,smaller portions   Medication: Wegovy, just finished 1mg .  Ready to go to 1.7mg  dose.  Any side effects of medication: quite significant nausea, acid reflux  No other aggravating or relieving factors.    No other c/o.  Past Medical History:  Diagnosis Date   23-polyvalent pneumococcal polysaccharide vaccine declined 7/14   Anxiety    prior therapy with counselor, high stress job, 12+ hour work days   Asthma    ED (erectile dysfunction)    Former smoker 2021   GERD (gastroesophageal reflux disease)    pulmonology consult 2020   Hypercholesteremia    Hyperglycemia    Hypertension    Obesity    Thoracic ascending aortic aneurysm (HCC)    Wears glasses     Current Outpatient Medications on File Prior to Visit  Medication Sig Dispense Refill   budesonide-formoterol (SYMBICORT) 160-4.5 MCG/ACT inhaler Inhale 2 puffs into the lungs 2 (two) times daily. 1 each 3   irbesartan (AVAPRO) 150 MG tablet Take 1 tablet (150 mg total) by mouth daily. 90 tablet 3   pantoprazole (PROTONIX) 40 MG tablet Take 1 tablet (40 mg total) by mouth daily. 90 tablet 3   rosuvastatin (CRESTOR) 10 MG tablet Take 1 tablet by mouth once daily 30 tablet 5   Semaglutide-Weight Management (WEGOVY) 1 MG/0.5ML SOAJ Inject 1 mg into the skin once a week. 2 mL 0   tadalafil (CIALIS) 20 MG tablet Take 1 tablet (20 mg total) by mouth daily. as directed 8 tablet 11   valACYclovir (VALTREX) 1000 MG tablet TAKE 2 TABLETS BY MOUTH TWICE DAILY FOR 2 DAYS FOR  FLARE  UP 30 tablet 1   No current  facility-administered medications on file prior to visit.    The following portions of the patient's history were reviewed and updated as appropriate: allergies, current medications, past family history, past medical history, past social history, past surgical history and problem list.  ROS Otherwise as in subjective above     Objective: BP 120/70   Pulse 91   Wt 224 lb 3.2 oz (101.7 kg)   BMI 32.17 kg/m   Wt Readings from Last 3 Encounters:  04/14/23 224 lb 3.2 oz (101.7 kg)  02/03/23 232 lb (105.2 kg)  11/09/22 232 lb 12.8 oz (105.6 kg)    General appearance: alert, no distress, well developed, well nourished    Assessment: Encounter Diagnoses  Name Primary?   Medication management Yes   Gastroesophageal reflux disease without esophagitis    Hypercholesteremia      Plan: Discussed progress so far.  Continue with disciplined approach to diet and exercise.  Increase to Wegovy 1.7 mg weekly.  After 1 month the plan will be to increase to highest dose once he calls back to give [place] update on tolerance at the 1.7mg  dose.     Exavior was seen today for medical management of chronic issues.  Diagnoses and all orders for this visit:  Medication management  Gastroesophageal reflux disease without esophagitis  Hypercholesteremia  Other orders -  Semaglutide-Weight Management (WEGOVY) 1.7 MG/0.75ML SOAJ; Inject 1.7 mg into the skin once a week.   Follow up: 98mo

## 2023-04-23 ENCOUNTER — Other Ambulatory Visit: Payer: Self-pay | Admitting: Cardiology

## 2023-04-23 DIAGNOSIS — I10 Essential (primary) hypertension: Secondary | ICD-10-CM

## 2023-04-23 DIAGNOSIS — I7121 Aneurysm of the ascending aorta, without rupture: Secondary | ICD-10-CM

## 2023-04-28 DIAGNOSIS — M9902 Segmental and somatic dysfunction of thoracic region: Secondary | ICD-10-CM | POA: Diagnosis not present

## 2023-04-28 DIAGNOSIS — M62838 Other muscle spasm: Secondary | ICD-10-CM | POA: Diagnosis not present

## 2023-04-28 DIAGNOSIS — M542 Cervicalgia: Secondary | ICD-10-CM | POA: Diagnosis not present

## 2023-04-28 DIAGNOSIS — M9901 Segmental and somatic dysfunction of cervical region: Secondary | ICD-10-CM | POA: Diagnosis not present

## 2023-04-29 DIAGNOSIS — M542 Cervicalgia: Secondary | ICD-10-CM | POA: Diagnosis not present

## 2023-04-29 DIAGNOSIS — M9901 Segmental and somatic dysfunction of cervical region: Secondary | ICD-10-CM | POA: Diagnosis not present

## 2023-04-29 DIAGNOSIS — M9902 Segmental and somatic dysfunction of thoracic region: Secondary | ICD-10-CM | POA: Diagnosis not present

## 2023-04-29 DIAGNOSIS — M62838 Other muscle spasm: Secondary | ICD-10-CM | POA: Diagnosis not present

## 2023-05-05 DIAGNOSIS — M9901 Segmental and somatic dysfunction of cervical region: Secondary | ICD-10-CM | POA: Diagnosis not present

## 2023-05-05 DIAGNOSIS — M9902 Segmental and somatic dysfunction of thoracic region: Secondary | ICD-10-CM | POA: Diagnosis not present

## 2023-05-05 DIAGNOSIS — M542 Cervicalgia: Secondary | ICD-10-CM | POA: Diagnosis not present

## 2023-05-05 DIAGNOSIS — M62838 Other muscle spasm: Secondary | ICD-10-CM | POA: Diagnosis not present

## 2023-05-06 DIAGNOSIS — M9902 Segmental and somatic dysfunction of thoracic region: Secondary | ICD-10-CM | POA: Diagnosis not present

## 2023-05-06 DIAGNOSIS — M9901 Segmental and somatic dysfunction of cervical region: Secondary | ICD-10-CM | POA: Diagnosis not present

## 2023-05-06 DIAGNOSIS — M62838 Other muscle spasm: Secondary | ICD-10-CM | POA: Diagnosis not present

## 2023-05-06 DIAGNOSIS — M542 Cervicalgia: Secondary | ICD-10-CM | POA: Diagnosis not present

## 2023-05-10 DIAGNOSIS — M9901 Segmental and somatic dysfunction of cervical region: Secondary | ICD-10-CM | POA: Diagnosis not present

## 2023-05-10 DIAGNOSIS — M542 Cervicalgia: Secondary | ICD-10-CM | POA: Diagnosis not present

## 2023-05-10 DIAGNOSIS — M9902 Segmental and somatic dysfunction of thoracic region: Secondary | ICD-10-CM | POA: Diagnosis not present

## 2023-05-10 DIAGNOSIS — M62838 Other muscle spasm: Secondary | ICD-10-CM | POA: Diagnosis not present

## 2023-05-11 ENCOUNTER — Telehealth: Payer: Self-pay | Admitting: Medical

## 2023-05-11 MED ORDER — WEGOVY 1.7 MG/0.75ML ~~LOC~~ SOAJ: 1.7000 mg | SUBCUTANEOUS | 0 refills | Status: DC

## 2023-05-11 NOTE — Telephone Encounter (Signed)
Pt left message needs refill [facility]

## 2023-05-12 DIAGNOSIS — M9901 Segmental and somatic dysfunction of cervical region: Secondary | ICD-10-CM | POA: Diagnosis not present

## 2023-05-12 DIAGNOSIS — M542 Cervicalgia: Secondary | ICD-10-CM | POA: Diagnosis not present

## 2023-05-12 DIAGNOSIS — M62838 Other muscle spasm: Secondary | ICD-10-CM | POA: Diagnosis not present

## 2023-05-12 DIAGNOSIS — M9902 Segmental and somatic dysfunction of thoracic region: Secondary | ICD-10-CM | POA: Diagnosis not present

## 2023-05-17 DIAGNOSIS — M542 Cervicalgia: Secondary | ICD-10-CM | POA: Diagnosis not present

## 2023-05-17 DIAGNOSIS — M62838 Other muscle spasm: Secondary | ICD-10-CM | POA: Diagnosis not present

## 2023-05-17 DIAGNOSIS — M9901 Segmental and somatic dysfunction of cervical region: Secondary | ICD-10-CM | POA: Diagnosis not present

## 2023-05-17 DIAGNOSIS — M9902 Segmental and somatic dysfunction of thoracic region: Secondary | ICD-10-CM | POA: Diagnosis not present

## 2023-05-19 DIAGNOSIS — M542 Cervicalgia: Secondary | ICD-10-CM | POA: Diagnosis not present

## 2023-05-19 DIAGNOSIS — M9901 Segmental and somatic dysfunction of cervical region: Secondary | ICD-10-CM | POA: Diagnosis not present

## 2023-05-19 DIAGNOSIS — M62830 Muscle spasm of back: Secondary | ICD-10-CM | POA: Diagnosis not present

## 2023-05-19 DIAGNOSIS — M9902 Segmental and somatic dysfunction of thoracic region: Secondary | ICD-10-CM | POA: Diagnosis not present

## 2023-05-19 DIAGNOSIS — M62838 Other muscle spasm: Secondary | ICD-10-CM | POA: Diagnosis not present

## 2023-06-18 ENCOUNTER — Telehealth: Payer: Self-pay | Admitting: Medical

## 2023-06-18 NOTE — Telephone Encounter (Signed)
Pt would like refill on Wegovy & would like to go up to 2.4mg , no side effects on 1.7, doing well please send to [facility]

## 2023-06-21 ENCOUNTER — Other Ambulatory Visit: Payer: Self-pay | Admitting: Medical

## 2023-06-21 MED ORDER — WEGOVY 2.4 MG/0.75ML ~~LOC~~ SOAJ: 2.4000 mg | SUBCUTANEOUS | 0 refills | Status: DC

## 2023-06-21 NOTE — Telephone Encounter (Signed)
Called pt to schedule 1 month follow up. No answer, left voicemail -aa

## 2023-06-22 DIAGNOSIS — M67912 Unspecified disorder of synovium and tendon, left shoulder: Secondary | ICD-10-CM | POA: Diagnosis not present

## 2023-06-24 ENCOUNTER — Other Ambulatory Visit: Payer: Self-pay | Admitting: Medical

## 2023-06-24 DIAGNOSIS — K219 Gastro-esophageal reflux disease without esophagitis: Secondary | ICD-10-CM

## 2023-07-08 ENCOUNTER — Other Ambulatory Visit: Payer: Self-pay | Admitting: Medical

## 2023-07-08 DIAGNOSIS — E7800 Pure hypercholesterolemia, unspecified: Secondary | ICD-10-CM

## 2023-07-08 NOTE — Telephone Encounter (Signed)
Has an appt in September

## 2023-07-14 ENCOUNTER — Other Ambulatory Visit: Payer: Self-pay | Admitting: Medical

## 2023-07-22 DIAGNOSIS — D369 Benign neoplasm, unspecified site: Secondary | ICD-10-CM | POA: Diagnosis not present

## 2023-07-22 DIAGNOSIS — Z85828 Personal history of other malignant neoplasm of skin: Secondary | ICD-10-CM | POA: Diagnosis not present

## 2023-07-22 DIAGNOSIS — Z129 Encounter for screening for malignant neoplasm, site unspecified: Secondary | ICD-10-CM | POA: Diagnosis not present

## 2023-07-22 DIAGNOSIS — L821 Other seborrheic keratosis: Secondary | ICD-10-CM | POA: Diagnosis not present

## 2023-08-03 ENCOUNTER — Encounter: Payer: BC Managed Care – PPO | Admitting: Medical

## 2023-08-05 ENCOUNTER — Encounter: Payer: Self-pay | Admitting: Medical

## 2023-08-05 ENCOUNTER — Ambulatory Visit (INDEPENDENT_AMBULATORY_CARE_PROVIDER_SITE_OTHER): Payer: BC Managed Care – PPO | Admitting: Medical

## 2023-08-05 VITALS — BP 120/78 | HR 82 | Ht 70.0 in | Wt 218.0 lb

## 2023-08-05 DIAGNOSIS — E559 Vitamin D deficiency, unspecified: Secondary | ICD-10-CM

## 2023-08-05 DIAGNOSIS — I10 Essential (primary) hypertension: Secondary | ICD-10-CM | POA: Diagnosis not present

## 2023-08-05 DIAGNOSIS — N529 Male erectile dysfunction, unspecified: Secondary | ICD-10-CM

## 2023-08-05 DIAGNOSIS — R5383 Other fatigue: Secondary | ICD-10-CM

## 2023-08-05 DIAGNOSIS — J4530 Mild persistent asthma, uncomplicated: Secondary | ICD-10-CM

## 2023-08-05 DIAGNOSIS — Z0000 Encounter for general adult medical examination without abnormal findings: Secondary | ICD-10-CM

## 2023-08-05 DIAGNOSIS — R7989 Other specified abnormal findings of blood chemistry: Secondary | ICD-10-CM | POA: Diagnosis not present

## 2023-08-05 DIAGNOSIS — K219 Gastro-esophageal reflux disease without esophagitis: Secondary | ICD-10-CM

## 2023-08-05 DIAGNOSIS — E785 Hyperlipidemia, unspecified: Secondary | ICD-10-CM

## 2023-08-05 DIAGNOSIS — F419 Anxiety disorder, unspecified: Secondary | ICD-10-CM

## 2023-08-05 DIAGNOSIS — Z125 Encounter for screening for malignant neoplasm of prostate: Secondary | ICD-10-CM

## 2023-08-05 DIAGNOSIS — I7121 Aneurysm of the ascending aorta, without rupture: Secondary | ICD-10-CM

## 2023-08-05 DIAGNOSIS — Z7185 Encounter for immunization safety counseling: Secondary | ICD-10-CM

## 2023-08-05 MED ORDER — OMEPRAZOLE 20 MG PO TBEC: 1.0000 | DELAYED_RELEASE_TABLET | Freq: Every day | ORAL | 1 refills | Status: DC

## 2023-08-05 NOTE — Progress Notes (Signed)
Subjective:   HPI  Cody Tapia is a 57 y.o. male who presents for Chief Complaint  Patient presents with   Annual Exam    Fasting cpe, no concerns, declines flu and covid today    Patient Care Team: Leannah Guse, Cleda Mccreedy as PCP - General (Family Medicine) Sees dentist Sees eye doctor Dr. Yates Decamp, cardiology Dr. Inetta Fermo, general surgery Dr. Amada Jupiter, GI   Concerns: Hypertension-compliant with irbesartan 150 mg daily.    Hyperlipidemia-compliant with rosuvastatin 10 mg daily  GERD he continues on Protonix 40 mg daily  Asthma-has Symbicort but hasn't had to use it lately.  Has albuterol for prn use.  BMI elevated  -he continues on Wegovy 2.4 mg weekly.  Continues to lose weight some.  Wakes up every day feeling hung over.   Reviewed their medical, surgical, family, social, medication, and allergy history and updated chart as appropriate.  Past Medical History:  Diagnosis Date   23-polyvalent pneumococcal polysaccharide vaccine declined 7/14   Anxiety    prior therapy with counselor, high stress job, 12+ hour work days   Asthma    ED (erectile dysfunction)    Former smoker 2021   GERD (gastroesophageal reflux disease)    pulmonology consult 2020   Hypercholesteremia    Hyperglycemia    Hypertension    Obesity    Thoracic ascending aortic aneurysm (HCC)    Wears glasses     Past Surgical History:  Procedure Laterality Date   COLONOSCOPY  2009   [facility] for abdominal pain   COLONOSCOPY  12/2018   polyps TA, diverticulosis, Dr. Amada Jupiter   ESOPHAGOGASTRODUODENOSCOPY  2009   [facility], abdominal pain   EYE SURGERY     strabismus surgery, right   WISDOM TOOTH EXTRACTION      Family History  Problem Relation Age of Onset   Cancer Mother        lung   COPD Mother    Heart disease Father 78       stent, CAD   Stroke Father    Cancer Brother 75       prostate   Cancer Maternal Grandfather        prostate   Sudden  death Neg Hx    Heart attack Neg Hx    Hyperlipidemia Neg Hx    Diabetes Neg Hx    Hypertension Neg Hx      Current Outpatient Medications:    budesonide-formoterol (SYMBICORT) 160-4.5 MCG/ACT inhaler, Inhale 2 puffs into the lungs 2 (two) times daily., Disp: 1 each, Rfl: 3   irbesartan (AVAPRO) 150 MG tablet, Take 1 tablet by mouth once daily, Disp: 90 tablet, Rfl: 3   Omeprazole 20 MG TBEC, Take 1 tablet (20 mg total) by mouth daily., Disp: 30 tablet, Rfl: 1   rosuvastatin (CRESTOR) 10 MG tablet, Take 1 tablet by mouth once daily, Disp: 30 tablet, Rfl: 0   tadalafil (CIALIS) 20 MG tablet, Take 1 tablet (20 mg total) by mouth daily. as directed, Disp: 8 tablet, Rfl: 11   valACYclovir (VALTREX) 1000 MG tablet, TAKE 2 TABLETS BY MOUTH TWICE DAILY FOR 2 DAYS FOR  FLARE  UP, Disp: 30 tablet, Rfl: 1   WEGOVY 2.4 MG/0.75ML SOAJ, INJECT  2.4 MG INTO THE SKIN ONCE A WEEK, Disp: 4 mL, Rfl: 0  Allergies  Allergen Reactions   Augmentin [Amoxicillin-Pot Clavulanate]     Bad diarrhea/GI upset   Qsymia [Phentermine-Topiramate]     Diarrhea  Review of Systems  Constitutional:  Positive for malaise/fatigue. Negative for chills, fever and weight loss.  HENT:  Negative for congestion, ear pain, hearing loss, sore throat and tinnitus.   Eyes:  Negative for blurred vision, pain and redness.  Respiratory:  Negative for cough, hemoptysis and shortness of breath.   Cardiovascular:  Negative for chest pain, palpitations, orthopnea, claudication and leg swelling.  Gastrointestinal:  Negative for abdominal pain, blood in stool, constipation, diarrhea, nausea and vomiting.  Genitourinary:  Negative for dysuria, flank pain, frequency, hematuria and urgency.  Musculoskeletal:  Negative for falls, joint pain and myalgias.  Skin:  Negative for itching and rash.  Neurological:  Negative for dizziness, tingling, speech change, weakness and headaches.  Endo/Heme/Allergies:  Negative for polydipsia. Does not  bruise/bleed easily.  Psychiatric/Behavioral:  Negative for depression and memory loss. The patient is not nervous/anxious and does not have insomnia.        08/05/2023    8:48 AM 07/07/2022    2:58 PM 04/23/2021    9:25 AM 01/10/2020    8:50 AM 03/07/2018    2:02 PM  Depression screen PHQ 2/9  Decreased Interest 0 0 0 0 0  Down, Depressed, Hopeless 0 0 1 0 0  PHQ - 2 Score 0 0 1 0 0  Altered sleeping   1    Tired, decreased energy   2    Change in appetite   2    Feeling bad or failure about yourself    2    Trouble concentrating   0    Moving slowly or fidgety/restless   0    Suicidal thoughts   0    PHQ-9 Score   8    Difficult doing work/chores   Not difficult at all          Objective:  BP 120/78   Pulse 82   Ht 5\' 10"  (1.778 m)   Wt 218 lb (98.9 kg)   BMI 31.28 kg/m   Wt Readings from Last 3 Encounters:  08/05/23 218 lb (98.9 kg)  04/14/23 224 lb 3.2 oz (101.7 kg)  02/03/23 232 lb (105.2 kg)   Wt Readings from Last 3 Encounters:  08/05/23 218 lb (98.9 kg)  04/14/23 224 lb 3.2 oz (101.7 kg)  02/03/23 232 lb (105.2 kg)    General appearance: alert, no distress, WD/WN, Caucasian male Skin: unremarkable HEENT: normocephalic, conjunctiva/corneas normal, sclerae anicteric, PERRLA, EOMi, nares patent, no discharge or erythema, pharynx normal Oral cavity: MMM, tongue normal, teeth normal Neck: supple, no lymphadenopathy, no thyromegaly, no masses, normal ROM, no bruits Chest: non tender, normal shape and expansion Heart: RRR, normal S1, S2, no murmurs Lungs: CTA bilaterally, no wheezes, rhonchi, or rales Abdomen: +bs, soft, non tender, non distended, no masses, no hepatomegaly, no splenomegaly, no bruits Back: non tender, normal ROM, no scoliosis Musculoskeletal: upper extremities non tender, no obvious deformity, normal ROM throughout, lower extremities non tender, no obvious deformity, normal ROM throughout Extremities: no edema, no cyanosis, no clubbing Pulses:  2+ symmetric, upper and lower extremities, normal cap refill Neurological: alert, oriented x 3, CN2-12 intact, strength normal upper extremities and lower extremities, sensation normal throughout, DTRs 2+ throughout, no cerebellar signs, gait normal Psychiatric: normal affect, behavior normal, pleasant  GU: normal male external genitalia,circumcised, nontender, no masses, no hernia, no lymphadenopathy Rectal: deferred   Assessment and Plan :   Encounter Diagnoses  Name Primary?   Routine general medical examination at a health care facility Yes  Aneurysm of ascending aorta without rupture (HCC)    Vaccine counseling    Hyperlipidemia, unspecified hyperlipidemia type    Primary hypertension    Mild persistent asthma without complication    Screening for prostate cancer    Erectile dysfunction, unspecified erectile dysfunction type    Gastroesophageal reflux disease, unspecified whether esophagitis present    Anxiety    Low testosterone    Vitamin D deficiency    Other fatigue      This visit was a preventative care visit, also known as wellness visit or routine physical.   Topics typically include healthy lifestyle, diet, exercise, preventative care, vaccinations, sick and well care, proper use of emergency dept and after hours care, as well as other concerns.     Recommendations: Continue to return yearly for your annual wellness and preventative care visits.  This gives [place] a chance to discuss healthy lifestyle, exercise, vaccinations, review your chart record, and perform screenings where appropriate.  I recommend you see your eye doctor yearly for routine vision care.  I recommend you see your dentist yearly for routine dental care including hygiene visits twice yearly.   Vaccination recommendations were reviewed Immunization History  Administered Date(s) Administered   Influenza,inj,Quad PF,6+ Mos 10/18/2018, 12/13/2019   PFIZER Comirnaty(Gray Top)Covid-19 Tri-Sucrose  Vaccine 04/23/2021   PFIZER(Purple Top)SARS-COV-2 Vaccination 01/31/2020, 02/21/2020, 09/20/2020   Pneumococcal Polysaccharide-23 01/14/2017   Tdap 06/21/2013    Vaccine recommendations: Tdap tetanus Shingles Yearly influenza Covid  Vaccines administered today: Declines vaccines today due to early onset of cold symptoms.    Screening for cancer: Colon cancer screening: I reviewed your colonoscopy on file that is up to date from 2020.  Due for repeat 2025  We discussed PSA, prostate exam, and prostate cancer screening risks/benefits.     Skin cancer screening: Check your skin regularly for new changes, growing lesions, or other lesions of concern Come in for evaluation if you have skin lesions of concern.  Lung cancer screening: If you have a greater than 20 pack year history of tobacco use, then you may qualify for lung cancer screening with a chest CT scan.   Please call your insurance company to inquire about coverage for this test.  We currently don't have screenings for other cancers besides breast, cervical, colon, and lung cancers.  If you have a strong family history of cancer or have other cancer screening concerns, please let me know.    Bone health: Get at least 150 minutes of aerobic exercise weekly Get weight bearing exercise at least once weekly Bone density test:  A bone density test is an imaging test that uses a type of X-Alexande to measure the amount of calcium and other minerals in your bones. The test may be used to diagnose or screen you for a condition that causes weak or thin bones (osteoporosis), predict your risk for a broken bone (fracture), or determine how well your osteoporosis treatment is working. The bone density test is recommended for females 65 and older, or females or males <65 if certain risk factors such as thyroid disease, long term use of steroids such as for asthma or rheumatological issues, vitamin D deficiency, estrogen deficiency, family  history of osteoporosis, self or family history of fragility fracture in first degree relative.    Heart health: Get at least 150 minutes of aerobic exercise weekly Limit alcohol It is important to maintain a healthy blood pressure and healthy cholesterol numbers  Heart disease screening: Screening for heart disease includes  screening for blood pressure, fasting lipids, glucose/diabetes screening, BMI height to weight ratio, reviewed of smoking status, physical activity, and diet.    Goals include blood pressure 120/80 or less, maintaining a healthy lipid/cholesterol profile, preventing diabetes or keeping diabetes numbers under good control, not smoking or using tobacco products, exercising most days per week or at least 150 minutes per week of exercise, and eating healthy variety of fruits and vegetables, healthy oils, and avoiding unhealthy food choices like fried food, fast food, high sugar and high cholesterol foods.     CT angio chest 07/08/2022 IMPRESSION: 4.0 cm ascending thoracic aortic aneurysm is noted on this ungated exam. Recommend annual imaging followup by CTA or MRA. This recommendation follows 2010 ACCF/AHA/AATS/ACR/ASA/SCA/SCAI/SIR/STS/SVM Guidelines for the Diagnosis and Management of Patients with Thoracic Aortic Disease. Circulation. 2010; 121: Q657-Q469. Aortic aneurysm NOS (ICD10-I71.9).   Stable calcified granulomas are noted in right upper lobe consistent with prior granulomatous disease.    Echocardiogram 10/2022 Echocardiogram 11/18/2022:   Normal LV systolic function with visual EF 55-60%. Left ventricle cavity is normal in size. Normal left ventricular wall thickness. Normal global wall motion. Normal diastolic filling pattern, normal LAP. Calculated EF 54%. Structurally normal tricuspid valve with trace regurgitation. No evidence of pulmonary hypertension. No significant change compared to 12/2018. No aortic aneurysm noted.    Plan repeat CT angio  in 2025 per cardiology    Medical care options: I recommend you continue to seek care here first for routine care.  We try really hard to have available appointments Monday through Friday daytime hours for sick visits, acute visits, and physicals.  Urgent care should be used for after hours and weekends for significant issues that cannot wait till the next day.  The emergency department should be used for significant potentially life-threatening emergencies.  The emergency department is expensive, can often have long wait times for less significant concerns, so try to utilize primary care, urgent care, or telemedicine when possible to avoid unnecessary trips to the emergency department.  Virtual visits and telemedicine have been introduced since the pandemic started in 2020, and can be convenient ways to receive medical care.  We offer virtual appointments as well to assist you in a variety of options to seek medical care.   Advanced Directives: I recommend you consider completing a Health Care Power of Attorney and Living Will.   These documents respect your wishes and help alleviate burdens on your loved ones if you were to become terminally ill or be in a position to need those documents enforced.    You can complete Advanced Directives yourself, have them notarized, then have copies made for our office, for you and for anybody you feel should have them in safe keeping.  Or, you can have an attorney prepare these documents.   If you haven't updated your Last Will and Testament in a while, it may be worthwhile having an attorney prepare these documents together and save on some costs.       Separate significant issues discussed: Hypertension-continue irbesartan 150 mg daily  Hyperlipidemia-continue rosuvastatin Crestor 10 mg daily  History of cold sores-continue Valtrex prn  GERD-change to lower dose PPI 20 mg daily.  Discussed risk and benefits of medication.  Asthma-continue Symbicort  twice daily for maintenance in worse periods of symptoms, albuterol rescue inhaler as needed  BMI elevated -continue Wegovy 2.4 mg weekly injection along with healthy diet and exercise  Fatigue - pending labs, consider sleep study.  Discussed other factors including  prior low normal testosterone, work stress, other.    Hx/o anal fissure - thankfully he notes the only thing that helped was manual therapy with integrative therapies this past year despite other prior treatments including bottom, surgery and other.   Cody "RJ" was seen today for annual exam.  Diagnoses and all orders for this visit:  Routine general medical examination at a health care facility -     Comprehensive metabolic panel -     CBC -     Lipid panel -     PSA -     Hemoglobin A1c -     VITAMIN D 25 Hydroxy (Vit-D Deficiency, Fractures) -     Testosterone -     TSH  Aneurysm of ascending aorta without rupture (HCC)  Vaccine counseling  Hyperlipidemia, unspecified hyperlipidemia type -     Lipid panel  Primary hypertension -     Comprehensive metabolic panel  Mild persistent asthma without complication  Screening for prostate cancer -     PSA  Erectile dysfunction, unspecified erectile dysfunction type  Gastroesophageal reflux disease, unspecified whether esophagitis present  Anxiety  Low testosterone -     Testosterone  Vitamin D deficiency -     VITAMIN D 25 Hydroxy (Vit-D Deficiency, Fractures)  Other fatigue -     VITAMIN D 25 Hydroxy (Vit-D Deficiency, Fractures) -     Testosterone -     TSH  Other orders -     Omeprazole 20 MG TBEC; Take 1 tablet (20 mg total) by mouth daily.     Follow-up pending labs, yearly for physical

## 2023-08-06 ENCOUNTER — Other Ambulatory Visit: Payer: Self-pay | Admitting: Medical

## 2023-08-06 DIAGNOSIS — E7800 Pure hypercholesterolemia, unspecified: Secondary | ICD-10-CM

## 2023-08-06 LAB — COMPREHENSIVE METABOLIC PANEL
ALT: 23 IU/L (ref 0–44)
AST: 21 IU/L (ref 0–40)
Albumin: 4.4 g/dL (ref 3.8–4.9)
Alkaline Phosphatase: 94 IU/L (ref 44–121)
BUN/Creatinine Ratio: 21 — ABNORMAL HIGH (ref 9–20)
BUN: 20 mg/dL (ref 6–24)
Bilirubin Total: 0.3 mg/dL (ref 0.0–1.2)
CO2: 21 mmol/L (ref 20–29)
Calcium: 9 mg/dL (ref 8.7–10.2)
Chloride: 104 mmol/L (ref 96–106)
Creatinine, Ser: 0.97 mg/dL (ref 0.76–1.27)
Globulin, Total: 2.6 g/dL (ref 1.5–4.5)
Glucose: 96 mg/dL (ref 70–99)
Potassium: 4.3 mmol/L (ref 3.5–5.2)
Sodium: 139 mmol/L (ref 134–144)
Total Protein: 7 g/dL (ref 6.0–8.5)
eGFR: 92 mL/min/{1.73_m2} (ref >59)

## 2023-08-06 LAB — CBC
Hematocrit: 48.5 % (ref 37.5–51.0)
Hemoglobin: 15.8 g/dL (ref 13.0–17.7)
MCH: 31.2 pg (ref 26.6–33.0)
MCHC: 32.6 g/dL (ref 31.5–35.7)
MCV: 96 fL (ref 79–97)
Platelets: 281 10*3/uL (ref 150–450)
RBC: 5.07 x10E6/uL (ref 4.14–5.80)
RDW: 13.1 % (ref 11.6–15.4)
WBC: 8 10*3/uL (ref 3.4–10.8)

## 2023-08-06 LAB — VITAMIN D 25 HYDROXY (VIT D DEFICIENCY, FRACTURES): Vit D, 25-Hydroxy: 34.7 ng/mL (ref 30.0–100.0)

## 2023-08-06 LAB — LIPID PANEL
Chol/HDL Ratio: 3.7 ratio (ref 0.0–5.0)
Cholesterol, Total: 161 mg/dL (ref 100–199)
HDL: 43 mg/dL (ref >39)
LDL Chol Calc (NIH): 99 mg/dL (ref 0–99)
Triglycerides: 103 mg/dL (ref 0–149)
VLDL Cholesterol Cal: 19 mg/dL (ref 5–40)

## 2023-08-06 LAB — HEMOGLOBIN A1C
Est. average glucose Bld gHb Est-mCnc: 114 mg/dL
Hgb A1c MFr Bld: 5.6 % (ref 4.8–5.6)

## 2023-08-06 LAB — TSH: TSH: 1.8 u[IU]/mL (ref 0.450–4.500)

## 2023-08-06 LAB — TESTOSTERONE: Testosterone: 391 ng/dL (ref 264–916)

## 2023-08-06 LAB — PSA: Prostate Specific Ag, Serum: 0.4 ng/mL (ref 0.0–4.0)

## 2023-08-06 MED ORDER — WEGOVY 2.4 MG/0.75ML ~~LOC~~ SOAJ: 2.4000 mg | SUBCUTANEOUS | 2 refills | Status: DC

## 2023-08-06 MED ORDER — TADALAFIL 20 MG PO TABS: 20.0000 mg | ORAL_TABLET | Freq: Every day | ORAL | 5 refills | Status: DC

## 2023-08-06 MED ORDER — ROSUVASTATIN CALCIUM 10 MG PO TABS
10.0000 mg | ORAL_TABLET | Freq: Every day | ORAL | 3 refills | Status: DC
Start: 2023-08-06 — End: 2024-07-17

## 2023-08-06 NOTE — Progress Notes (Signed)
Results sent through MyChart

## 2023-08-10 ENCOUNTER — Telehealth: Payer: Self-pay

## 2023-08-10 NOTE — Telephone Encounter (Signed)
Pt would like to begin sleep study that was discussed during physical

## 2023-08-11 NOTE — Telephone Encounter (Signed)
Referred to [facility] for sleep study

## 2023-08-16 ENCOUNTER — Telehealth: Payer: Self-pay

## 2023-08-16 NOTE — Telephone Encounter (Signed)
(  KeyErskine Emery) Drug: Wegovy 2.4MG /0.75ML auto-injectors Form: [employer] Electronic Prior Authorization Form (2017 NCPDP) Status: Wait for Determination

## 2023-08-17 NOTE — Telephone Encounter (Signed)
Pt.notified

## 2023-08-17 NOTE — Telephone Encounter (Signed)
KeyErskine Emery Drug: Wegovy 2.4MG /0.75ML auto-injectors Form: [employer] Electronic Prior Authorization Form (2017 NCPDP) Determination: Favorable  Your prior authorization for Cody Tapia has been approved!  Request Reference Number: [id number]. WEGOVY INJ 2.4MG  is approved through 02/13/2024. Your patient may now fill this prescription and it will be covered.  Authorization Expiration Date: February 13, 2024.

## 2023-08-17 NOTE — Telephone Encounter (Signed)
Called pt, no answer, left voicemail. Approval faxed to pharmacy

## 2023-09-15 ENCOUNTER — Encounter: Payer: Self-pay | Admitting: Internal Medicine

## 2023-09-15 ENCOUNTER — Telehealth: Payer: Self-pay | Admitting: Medical

## 2023-09-15 NOTE — Telephone Encounter (Signed)
His sleep study shows mild sleep apnea, decreased breathing or stop breathing about 10 times per hour.  There was significant decrease in oxygen the for about 14% of the night, oxygen less than 90%.  Based on this we would recommend a trial of a CPAP device.  He may want to discuss this first with a virtual visit.  If not we can send orders to home health about trying CPAP  I recommend elevating head of the bed, avoid sleeping flat on your back.

## 2023-09-15 NOTE — Telephone Encounter (Signed)
Left message for pt to call me back and sent him results through [facility]

## 2023-09-20 NOTE — Telephone Encounter (Signed)
Left message for pt to call back  He has read mychart sleep study results. Wanted to see if he want to start trial for CPAP or do virtual visit with shane to discuss sleep study

## 2023-10-01 ENCOUNTER — Telehealth: Payer: BC Managed Care – PPO | Admitting: Medical

## 2023-10-01 DIAGNOSIS — Z9289 Personal history of other medical treatment: Secondary | ICD-10-CM | POA: Insufficient documentation

## 2023-10-01 DIAGNOSIS — G4733 Obstructive sleep apnea (adult) (pediatric): Secondary | ICD-10-CM | POA: Insufficient documentation

## 2023-10-01 DIAGNOSIS — Z87891 Personal history of nicotine dependence: Secondary | ICD-10-CM

## 2023-10-01 NOTE — Progress Notes (Signed)
Subjective:     Patient ID: Cody Tapia, male   DOB: 10/24/1966, 57 y.o.   MRN: [id number]  This visit type was conducted due to national recommendations for restrictions regarding the COVID-19 Pandemic (e.g. social distancing) in an effort to limit this patient's exposure and mitigate transmission in our community.  Due to their co-morbid illnesses, this patient is at least at moderate risk for complications without adequate follow up.  This format is felt to be most appropriate for this patient at this time.    Documentation for virtual audio and video telecommunications through [place] encounter:  The patient was located at home. The provider was located in the office. The patient did consent to this visit and is aware of possible charges through their insurance for this visit.  The other persons participating in this telemedicine service were none. Time spent on call was 20 minutes and in review of previous records 20 minutes total.  This virtual service is not related to other E/M service within previous 7 days.   HPI Chief Complaint  Patient presents with   Consult    Discuss sleep study   Virtual for follow up on sleep study.   Sleeps elevated head of bed already.  Has had fatigue concerns.   Has questions about lung cancer screening.  Former smoker  Past Medical History:  Diagnosis Date   23-polyvalent pneumococcal polysaccharide vaccine declined 7/14   Anxiety    prior therapy with counselor, high stress job, 12+ hour work days   Asthma    ED (erectile dysfunction)    Former smoker 2021   GERD (gastroesophageal reflux disease)    pulmonology consult 2020   Hypercholesteremia    Hyperglycemia    Hypertension    Obesity    Thoracic ascending aortic aneurysm (HCC)    Wears glasses    Current Outpatient Medications on File Prior to Visit  Medication Sig Dispense Refill   budesonide-formoterol (SYMBICORT) 160-4.5 MCG/ACT inhaler Inhale 2 puffs into the  lungs 2 (two) times daily. 1 each 3   irbesartan (AVAPRO) 150 MG tablet Take 1 tablet by mouth once daily 90 tablet 3   Omeprazole 20 MG TBEC Take 1 tablet (20 mg total) by mouth daily. 30 tablet 1   rosuvastatin (CRESTOR) 10 MG tablet Take 1 tablet (10 mg total) by mouth daily. 90 tablet 3   Semaglutide-Weight Management (WEGOVY) 2.4 MG/0.75ML SOAJ Inject 2.4 mg into the skin once a week. 4 mL 2   tadalafil (CIALIS) 20 MG tablet Take 1 tablet (20 mg total) by mouth daily. as directed 8 tablet 5   valACYclovir (VALTREX) 1000 MG tablet TAKE 2 TABLETS BY MOUTH TWICE DAILY FOR 2 DAYS FOR  FLARE  UP 30 tablet 1   No current facility-administered medications on file prior to visit.     Review of Systems As in subjective    Objective:   Physical Exam Due to coronavirus pandemic stay at home measures, patient visit was virtual and they were not examined in person.   There were no vitals taken for this visit.  Gen: wd, wn ,nad     Assessment:     Encounter Diagnoses  Name Primary?   OSA (obstructive sleep apnea) Yes   Former smoker    History of sleep study        Plan:     We reviewed his recent sleep study from 08/20/2023.  He did 3 nights of the study.  AHI 9.9.  Oxygen  less than 88% for 46 minutes or 14% of the time  We discussed strategies including weight loss, elevate head of the bed which she is already doing, not sleeping flat on the back, discussed options including CPAP, airway device oral, inspire procedure.  He will begin trial of CPAP.  Follow-up in roughly a month after using CPAP trial  He had a CT of his chest last year showing no worrisome lung nodules.  We discussed that he can repeat this if desired.  He sees cardiology soon to see if they are going to want to do another CT this year for heart monitoring which would also pick up on the lungs  We discussed doing a PFT periodically through pulmonology for lung testing    Cody "RJ" was seen today for  consult.  Diagnoses and all orders for this visit:  OSA (obstructive sleep apnea)  Former smoker  History of sleep study    F/u 48mo after starting CPAP

## 2023-10-01 NOTE — Progress Notes (Signed)
Sent order to [facility] for cpap machine and supplies

## 2023-10-03 ENCOUNTER — Other Ambulatory Visit: Payer: Self-pay | Admitting: Medical

## 2023-10-12 DIAGNOSIS — G4733 Obstructive sleep apnea (adult) (pediatric): Secondary | ICD-10-CM | POA: Diagnosis not present

## 2023-10-18 ENCOUNTER — Ambulatory Visit: Payer: BC Managed Care – PPO | Admitting: Medical

## 2023-10-18 VITALS — BP 122/64 | HR 77 | Temp 97.5°F | Wt 224.4 lb

## 2023-10-18 DIAGNOSIS — S0181XA Laceration without foreign body of other part of head, initial encounter: Secondary | ICD-10-CM | POA: Diagnosis not present

## 2023-10-18 NOTE — Progress Notes (Signed)
Subjective:  Cody Tapia is a 57 y.o. male who presents for Chief Complaint  Patient presents with   Genia Hotter at home and hit a door frame happen at 11:30 last night. Fiancee put steri strip on it this morning     Here for laceration.  DOI: 10/17/23 <24 hours ago  Last night about 11:30 PM he tripped on the door frame going to the bathroom and his right forehead against a door frame.  No loss of consciousness, no syncope.  He had quite a bit of bleeding at first but they finally got the bleeding to stop.  He and his fiance irrigated the wound well water thoroughly.  They used a Steri-Strip.  He is here today for evaluation.  He has minimal swelling.  No headache.  No other symptoms or concerns today.  No other aggravating or relieving factors.    No other c/o.  The following portions of the patient's history were reviewed and updated as appropriate: allergies, current medications, past family history, past medical history, past social history, past surgical history and problem list.  ROS Otherwise as in subjective above  Objective: BP 122/64   Pulse 77   Temp (!) 97.5 F (36.4 C)   Wt 224 lb 6.4 oz (101.8 kg)   BMI 32.20 kg/m   General appearance: alert, no distress, well developed, well nourished There is a vertical linear wound that is approximately 12 mm long over the right eyelid that is approximated.  It is not gaping open or bleeding at the moment.  There is just minimal swelling around the wound.  Slight bruising.   Otherwise face nontender, no other deformity, PERRLA, no other abnormality of the facial muscles or skin. Psych: Pleasant, answers questions appropriately    Assessment: Encounter Diagnosis  Name Primary?   Facial laceration, initial encounter Yes     Plan: We discussed findings.  After removing the Steri-Strips, the wound was already well approximated and not going to open up.  He requested skin glue instead of suture or other.  We  discussed options for therapy.  Cleaned and prepped area in usual sterile fashion.  Agitated the wound opened slightly.  I flushed the wound with saline copiously.  I applied skin glue to the wound and allowed it to dry.  Patient tolerated procedure well.  Discussed precautions, hygiene, do not take a shower until later tonight or tomorrow morning.  Avoid reinjury.  Advised that the glue will gradually wear off over the next week.  Jalik "RJ" was seen today for fall.  Diagnoses and all orders for this visit:  Facial laceration, initial encounter    Follow up: prn

## 2023-10-24 DIAGNOSIS — G4733 Obstructive sleep apnea (adult) (pediatric): Secondary | ICD-10-CM | POA: Diagnosis not present

## 2023-11-10 ENCOUNTER — Ambulatory Visit: Payer: Self-pay | Admitting: Cardiology

## 2023-11-15 ENCOUNTER — Ambulatory Visit: Payer: BC Managed Care – PPO | Admitting: Medical

## 2023-11-15 VITALS — BP 120/72 | HR 82 | Temp 97.2°F | Resp 16 | Wt 226.6 lb

## 2023-11-15 DIAGNOSIS — J988 Other specified respiratory disorders: Secondary | ICD-10-CM | POA: Diagnosis not present

## 2023-11-15 DIAGNOSIS — R058 Other specified cough: Secondary | ICD-10-CM | POA: Diagnosis not present

## 2023-11-15 MED ORDER — BUDESONIDE-FORMOTEROL FUMARATE 160-4.5 MCG/ACT IN AERO: 2.0000 | INHALATION_SPRAY | Freq: Two times a day (BID) | RESPIRATORY_TRACT | 3 refills | Status: DC

## 2023-11-15 MED ORDER — IPRATROPIUM-ALBUTEROL 0.5-2.5 (3) MG/3ML IN SOLN: 3.0000 mL | Freq: Four times a day (QID) | RESPIRATORY_TRACT | 1 refills | Status: AC | PRN

## 2023-11-15 NOTE — Progress Notes (Signed)
Subjective:  Cody Tapia is a 56 y.o. male who presents for Chief Complaint  Patient presents with   chest cold    Chest cold since last Sunday. Sometimes laying down can feel wheezing and cracking. Going out of town for a week and wants to be proactive. Did albuterol nebulizer treatment, mucinex. Negative for covid and flu     Here for over a week of chest cold.  Has had cough, wheezing, mucus, congestion but has felt better by end of week, then worse again.  Still feels run down.  Improving though.  Did nebulizer this morning, using mucinex DM, Symbicort.   At night there is some wheezing and crackling.  He did a home COVID/flu test and it was negative.  No sick contacts.  But was around a lot of people in recent week or 2 with parties and job functions.   No other aggravating or relieving factors.    No other c/o.  Past Medical History:  Diagnosis Date   23-polyvalent pneumococcal polysaccharide vaccine declined 7/14   Anxiety    prior therapy with counselor, high stress job, 12+ hour work days   Asthma    ED (erectile dysfunction)    Former smoker 2021   GERD (gastroesophageal reflux disease)    pulmonology consult 2020   Hypercholesteremia    Hyperglycemia    Hypertension    Obesity    Thoracic ascending aortic aneurysm (HCC)    Wears glasses    Current Outpatient Medications on File Prior to Visit  Medication Sig Dispense Refill   irbesartan (AVAPRO) 150 MG tablet Take 1 tablet by mouth once daily 90 tablet 3   omeprazole (PRILOSEC) 20 MG capsule Take 1 capsule by mouth once daily 30 capsule 2   rosuvastatin (CRESTOR) 10 MG tablet Take 1 tablet (10 mg total) by mouth daily. 90 tablet 3   Semaglutide-Weight Management (WEGOVY) 2.4 MG/0.75ML SOAJ Inject 2.4 mg into the skin once a week. 4 mL 2   tadalafil (CIALIS) 20 MG tablet Take 1 tablet (20 mg total) by mouth daily. as directed 8 tablet 5   valACYclovir (VALTREX) 1000 MG tablet TAKE 2 TABLETS BY MOUTH TWICE DAILY FOR  2 DAYS FOR  FLARE  UP 30 tablet 1   No current facility-administered medications on file prior to visit.    The following portions of the patient's history were reviewed and updated as appropriate: allergies, current medications, past family history, past medical history, past social history, past surgical history and problem list.  ROS Otherwise as in subjective above  Objective: BP 120/72   Pulse 82   Temp (!) 97.2 F (36.2 C)   Resp 16   Wt 226 lb 9.6 oz (102.8 kg)   SpO2 98%   BMI 32.51 kg/m   General appearance: alert, no distress, well developed, well nourished HEENT: normocephalic, sclerae anicteric, conjunctiva pink and moist, mild erythema of right TM, nares with some mucoid discharge and erythema, pharynx with some postnasal drainage and mild erythema Oral cavity: MMM, no lesions Neck: supple, no lymphadenopathy, no thyromegaly, no masses Heart: RRR, normal S1, S2, no murmurs Lungs: Coarse sounds but no wheezes, rhonchi, or rales Pulses: 2+ radial pulses, 2+ pedal pulses, normal cap refill Ext: no edema   Assessment: Encounter Diagnoses  Name Primary?   Productive cough Yes   Respiratory tract infection      Plan: Continue home remedies and his usual inhaler Symbicort.   Begin trial of Duoneb as he doesn't  seem to respond all that well to albuterol plain.   Continue OTC congestion medicaiton, hydrate well, consider salt water gargles and nasal saline flush.  If worsening in the next 48 hours begin some of the Doxycycline he has at home already, x 7 days.   No obvious indication for antibiotics today.   Cody "RJ" was seen today for chest cold.  Diagnoses and all orders for this visit:  Productive cough  Respiratory tract infection  Other orders -     ipratropium-albuterol (DUONEB) 0.5-2.5 (3) MG/3ML SOLN; Take 3 mLs by nebulization every 6 (six) hours as needed. -     budesonide-formoterol (SYMBICORT) 160-4.5 MCG/ACT inhaler; Inhale 2 puffs into the lungs  2 (two) times daily.    Follow up: prn

## 2023-11-24 DIAGNOSIS — G4733 Obstructive sleep apnea (adult) (pediatric): Secondary | ICD-10-CM | POA: Diagnosis not present

## 2023-11-25 ENCOUNTER — Telehealth: Payer: Self-pay | Admitting: Medical

## 2023-11-25 NOTE — Telephone Encounter (Signed)
 Pt called to fu after a week as requested. He is feeling the same and says he would like to try a steroid.   Preferred pharmacy, [facility] [address] [place], [place] - [address]

## 2023-11-26 ENCOUNTER — Other Ambulatory Visit: Payer: Self-pay | Admitting: Medical

## 2023-11-26 MED ORDER — PREDNISONE 20 MG PO TABS: ORAL_TABLET | ORAL | 0 refills | Status: DC

## 2023-12-08 ENCOUNTER — Encounter: Payer: Self-pay | Admitting: Cardiology

## 2023-12-08 ENCOUNTER — Ambulatory Visit: Payer: BC Managed Care – PPO | Attending: Cardiology | Admitting: Cardiology

## 2023-12-08 ENCOUNTER — Telehealth: Payer: Self-pay | Admitting: Medical

## 2023-12-08 VITALS — BP 122/80 | HR 72 | Resp 16 | Ht 70.0 in | Wt 227.0 lb

## 2023-12-08 DIAGNOSIS — E7800 Pure hypercholesterolemia, unspecified: Secondary | ICD-10-CM

## 2023-12-08 DIAGNOSIS — I7121 Aneurysm of the ascending aorta, without rupture: Secondary | ICD-10-CM

## 2023-12-08 DIAGNOSIS — I10 Essential (primary) hypertension: Secondary | ICD-10-CM | POA: Diagnosis not present

## 2023-12-08 NOTE — Progress Notes (Signed)
 Cardiology Office Note:  .   Date:  12/08/2023  ID:  Cody Tapia, DOB 1966/03/30, MRN [id number] PCP: Garner Jury  [facility] Providers Cardiologist:  Knox Perl, MD   History of Present Illness: .   Cody Tapia is a 58 y.o. male with chronic tobacco use quit in 2020, mild obesity, hyperlipidemia, erectile dysfunction, CT scan of the chest on 11/18/2018 demonstrated mid ascending thoracic aorta measuring 38 to 40 mm indicating fusiform dilatation, no coronary artery calcification, but found calcified granulomas   Discussed the use of AI scribe software for clinical note transcription with the patient, who gave verbal consent to proceed.  History of Present Illness   The patient, with a history of thoracic aortic aneurysm, high cholesterol, and high blood pressure, presents for a routine check-up. The patient reports feeling well with no new symptoms. The patient's blood pressure and cholesterol levels are well controlled on current medications, including irbesartan  and rosuvastatin . The patient has been taking Wegovy  for weight loss and reports some success, although acknowledges the importance of lifestyle changes for sustained weight loss. The patient has been participating in a dry January and has noticed some weight loss as a result. The patient lives with his fianc and her adult son, which has been a source of stress. The patient expresses a desire to reduce medication dosages if possible, but understands the importance of maintaining current dosages for preventive measures.      Labs   Lab Results  Component Value Date   CHOL 161 08/05/2023   HDL 43 08/05/2023   LDLCALC 99 08/05/2023   TRIG 103 08/05/2023   CHOLHDL 3.7 08/05/2023   Lab Results  Component Value Date   NA 139 08/05/2023   K 4.3 08/05/2023   CO2 21 08/05/2023   GLUCOSE 96 08/05/2023   BUN 20 08/05/2023   CREATININE 0.97 08/05/2023   CALCIUM  9.0 08/05/2023   EGFR 92 08/05/2023    GFRNONAA 75 06/20/2020      Latest Ref Rng & Units 08/05/2023    9:13 AM 07/07/2022    3:36 PM 04/23/2021   10:30 AM  BMP  Glucose 70 - 99 mg/dL 96  88  83   BUN 6 - 24 mg/dL 20  15  20    Creatinine 0.76 - 1.27 mg/dL 4.09  8.11  9.14   BUN/Creat Ratio 9 - 20 21  15  19    Sodium 134 - 144 mmol/L 139  136  138   Potassium 3.5 - 5.2 mmol/L 4.3  4.1  4.4   Chloride 96 - 106 mmol/L 104  102  102   CO2 20 - 29 mmol/L 21  17  19    Calcium  8.7 - 10.2 mg/dL 9.0  9.4  9.3       Latest Ref Rng & Units 08/05/2023    9:13 AM 07/07/2022    3:36 PM 04/23/2021   10:30 AM  CBC  WBC 3.4 - 10.8 x10E3/uL 8.0  9.9  14.3   Hemoglobin 13.0 - 17.7 g/dL 78.2  95.6  21.3   Hematocrit 37.5 - 51.0 % 48.5  45.2  45.3   Platelets 150 - 450 x10E3/uL 281  260  350    Review of Systems  Cardiovascular:  Negative for chest pain, dyspnea on exertion and leg swelling.   Physical Exam:   VS:  BP 122/80 (BP Location: Left Arm, Patient Position: Sitting, Cuff Size: Large)   Pulse 72   Resp  16   Ht 5\' 10"  (1.778 m)   Wt 227 lb (103 kg)   SpO2 97%   BMI 32.57 kg/m    Wt Readings from Last 3 Encounters:  12/08/23 227 lb (103 kg)  11/15/23 226 lb 9.6 oz (102.8 kg)  10/18/23 224 lb 6.4 oz (101.8 kg)    Physical Exam Neck:     Vascular: No carotid bruit or JVD.  Cardiovascular:     Rate and Rhythm: Normal rate and regular rhythm.     Pulses: Intact distal pulses.     Heart sounds: Normal heart sounds. No murmur heard.    No gallop.  Pulmonary:     Effort: Pulmonary effort is normal.     Breath sounds: Normal breath sounds.  Abdominal:     General: Bowel sounds are normal.     Palpations: Abdomen is soft.  Musculoskeletal:     Right lower leg: No edema.     Left lower leg: No edema.    Studies Reviewed: .    CT angiogram chest 07/08/2022: 4.0 cm ascending thoracic aortic aneurysm is noted on this ungated exam. Recommend annual imaging followup by CTA or MRA. No change from 10/10/2020. Stable  calcified nodules are noted in the right upper lobe most consistent with granulomas.  Exercise sestamibi stress test 01/06/2019: 1. The patient performed treadmill exercise using Bruce protocol, completing 9:37 minutes. The patient completed an estimated workload of 11.1 METS, reaching 92% of the maximum predicted heart rate. Exercise capacity was excellent. Hemodynamic response was normal. Stress symptoms included fatigue. No ischemic changes seen on stress electrocardiogram. 2. The overall quality of the study is excellent. There is no evidence of abnormal lung activity. Stress and rest SPECT images demonstrate homogeneous tracer distribution throughout the myocardium. Gated SPECT imaging reveals normal myocardial thickening and wall motion. The left ventricular ejection fraction was normal (66%).   3. Low risk study.   Echocardiogram 11/18/2022:   Normal LV systolic function with visual EF 55-60%. Left ventricle cavity is normal in size. Normal left ventricular wall thickness. Normal global wall motion. Normal diastolic filling pattern, normal LAP. Calculated EF 54%. Structurally normal tricuspid valve with trace regurgitation. No evidence of pulmonary hypertension. No significant change compared to 12/2018. No aortic aneurysm noted.   EKG:    EKG Interpretation Date/Time:  Wednesday December 08 2023 16:07:01 EST Ventricular Rate:  70 PR Interval:  146 QRS Duration:  86 QT Interval:  380 QTC Calculation: 410 R Axis:   26  Text Interpretation: EKG 12/08/2023: Normal sinus rhythm at the rate of 70 bpm, normal EKG.  No significant change from 10/15/2019. Confirmed by Khristi Schiller, Jagadeesh [phone]) on 12/08/2023 4:27:47 PM    EKG 11/09/2022: Normal sinus rhythm at rate of 77 bpm, normal axis, no evidence of ischemia, normal EKG   Medications and allergies    Allergies  Allergen Reactions   Augmentin [Amoxicillin -Pot Clavulanate]     Bad diarrhea/GI upset   Qsymia  [Phentermine -Topiramate]      Diarrhea     Current Outpatient Medications:    budesonide -formoterol  (SYMBICORT ) 160-4.5 MCG/ACT inhaler, Inhale 2 puffs into the lungs 2 (two) times daily., Disp: 1 each, Rfl: 3   ipratropium-albuterol  (DUONEB) 0.5-2.5 (3) MG/3ML SOLN, Take 3 mLs by nebulization every 6 (six) hours as needed., Disp: 75 mL, Rfl: 1   irbesartan  (AVAPRO ) 150 MG tablet, Take 1 tablet by mouth once daily, Disp: 90 tablet, Rfl: 3   omeprazole  (PRILOSEC) 20 MG capsule, Take 1 capsule by mouth  once daily, Disp: 30 capsule, Rfl: 2   rosuvastatin  (CRESTOR ) 10 MG tablet, Take 1 tablet (10 mg total) by mouth daily., Disp: 90 tablet, Rfl: 3   Semaglutide -Weight Management (WEGOVY ) 2.4 MG/0.75ML SOAJ, Inject 2.4 mg into the skin once a week., Disp: 4 mL, Rfl: 2   tadalafil  (CIALIS ) 20 MG tablet, Take 1 tablet (20 mg total) by mouth daily. as directed, Disp: 8 tablet, Rfl: 5   valACYclovir  (VALTREX ) 1000 MG tablet, TAKE 2 TABLETS BY MOUTH TWICE DAILY FOR 2 DAYS FOR  FLARE  UP, Disp: 30 tablet, Rfl: 1   ASSESSMENT AND PLAN: .      ICD-10-CM   1. Aneurysm of ascending aorta without rupture (HCC)  I71.21 EKG 12-Lead    CT ANGIO CHEST AORTA W/CM & OR WO/CM    2. Essential hypertension  I10     3. Hypercholesteremia  E78.00      Assessment and Plan    Thoracic Aortic Aneurysm Stable with no symptoms. Discussed the importance of consistent medication use (Irbesartan  150mg ) for management and prevention of aneurysm growth. -Continue Irbesartan  150mg  daily. -Order CT angiogram in 6 months to monitor aneurysm.  Weight Management Recent weight loss with Wegovy , but temporary discontinuation due to illness. Discussed the importance of lifestyle modifications alongside medication use for sustained weight loss. -Resume Wegovy , consider starting at half dose due to previous nausea. -Encourage continued lifestyle modifications for weight management.  Hypertension Well controlled with Irbesartan  150mg  daily. Discussed the  importance of consistent dosing for optimal blood pressure control. -Continue Irbesartan  150mg  daily.  Hyperlipidemia LDL just below 100 on Rosuvastatin  5mg . Discussed the importance of maintaining LDL below 100. -Continue Rosuvastatin  5mg  daily.  General Health Maintenance -Encourage moderation in alcohol consumption, particularly on weekends. -Continue monitoring blood pressure and cholesterol levels regularly. -Follow-up PRN or after CT angiogram results.     There is very mild ascending aortic aneurysm/dilatation, this can be monitored on a biannual or every 3-year CT scan unless there is change in the size then may need to be repeated more frequently.  I simply reassured the patient.  I will see him back on a as needed basis.   Signed,  Knox Perl, MD, [facility] 12/08/2023, 8:48 PM [facility] [address] [place], [place] [address] Phone: [phone]. Fax:  [phone]

## 2023-12-08 NOTE — Telephone Encounter (Signed)
 Pt was sick for 3 weeks ( upper respiratory stuff) and did not take Wegovy  during that time period about  3-4 weeks He was on 2.4mg  at last injection And he wants to start back but not sure what dose to start back at   Please call

## 2023-12-08 NOTE — Patient Instructions (Signed)
 Medication Instructions:  Your physician recommends that you continue on your current medications as directed. Please refer to the Current Medication list given to you today.  *If you need a refill on your cardiac medications before your next appointment, please call your pharmacy*   Lab Work: none If you have labs (blood work) drawn today and your tests are completely normal, you will receive your results only by: MyChart Message (if you have MyChart) OR A paper copy in the mail If you have any lab test that is abnormal or we need to change your treatment, we will call you to review the results.   Testing/Procedures: Non-Cardiac CT Angiography (CTA), is a special type of CT scan that uses a computer to produce multi-dimensional views of major blood vessels throughout the body. In CT angiography, a contrast material is injected through an IV to help visualize the blood vessels.  To be done in 6 months.    Follow-Up: At [facility], you and your health needs are our priority.  As part of our continuing mission to provide you with exceptional heart care, we have created designated Provider Care Teams.  These Care Teams include your primary Cardiologist (physician) and Advanced Practice Providers (APPs -  Physician Assistants and Nurse Practitioners) who all work together to provide you with the care you need, when you need it.  We recommend signing up for the patient portal called "MyChart".  Sign up information is provided on this After Visit Summary.  MyChart is used to connect with patients for Virtual Visits (Telemedicine).  Patients are able to view lab/test results, encounter notes, upcoming appointments, etc.  Non-urgent messages can be sent to your provider as well.   To learn more about what you can do with MyChart, go to [email or web address].    Your next appointment:   As needed  Provider:   Knox Perl, MD     Other Instructions

## 2023-12-09 ENCOUNTER — Other Ambulatory Visit: Payer: Self-pay | Admitting: Medical

## 2023-12-09 MED ORDER — WEGOVY 0.5 MG/0.5ML ~~LOC~~ SOAJ: 0.5000 mg | SUBCUTANEOUS | 0 refills | Status: DC

## 2023-12-09 MED ORDER — WEGOVY 1 MG/0.5ML ~~LOC~~ SOAJ: 1.0000 mg | SUBCUTANEOUS | 0 refills | Status: DC

## 2023-12-09 NOTE — Telephone Encounter (Signed)
Pt was notified of results

## 2023-12-15 ENCOUNTER — Encounter: Payer: Self-pay | Admitting: Internal Medicine

## 2023-12-15 ENCOUNTER — Telehealth: Payer: Self-pay | Admitting: Medical

## 2023-12-15 NOTE — Telephone Encounter (Signed)
Needs CPAP compliance report and patient is visit virtual or in person once he has been on this at least a month which I think he has been at this point

## 2023-12-20 DIAGNOSIS — K5900 Constipation, unspecified: Secondary | ICD-10-CM | POA: Diagnosis not present

## 2023-12-20 DIAGNOSIS — Z1211 Encounter for screening for malignant neoplasm of colon: Secondary | ICD-10-CM | POA: Diagnosis not present

## 2023-12-23 ENCOUNTER — Other Ambulatory Visit: Payer: Self-pay | Admitting: Medical

## 2024-01-04 ENCOUNTER — Telehealth: Payer: BC Managed Care – PPO | Admitting: Medical

## 2024-01-04 DIAGNOSIS — R051 Acute cough: Secondary | ICD-10-CM | POA: Diagnosis not present

## 2024-01-04 DIAGNOSIS — J988 Other specified respiratory disorders: Secondary | ICD-10-CM | POA: Diagnosis not present

## 2024-01-04 DIAGNOSIS — R490 Dysphonia: Secondary | ICD-10-CM | POA: Diagnosis not present

## 2024-01-04 MED ORDER — OSELTAMIVIR PHOSPHATE 75 MG PO CAPS: 75.0000 mg | ORAL_CAPSULE | Freq: Two times a day (BID) | ORAL | 0 refills | Status: DC

## 2024-01-04 NOTE — Progress Notes (Signed)
Subjective:     Patient ID: Cody Tapia, male   DOB: 11-22-1966, 58 y.o.   MRN: [id number]  This visit type was conducted due to national recommendations for restrictions regarding the COVID-19 Pandemic (e.g. social distancing) in an effort to limit this patient's exposure and mitigate transmission in our community.  Due to their co-morbid illnesses, this patient is at least at moderate risk for complications without adequate follow up.  This format is felt to be most appropriate for this patient at this time.    Documentation for virtual audio and video telecommunications through [place] encounter:  The patient was located at home. The provider was located in the office. The patient did consent to this visit and is aware of possible charges through their insurance for this visit.  The other persons participating in this telemedicine service were none. Time spent on call was 20 minutes and in review of previous records 20 minutes total.  This virtual service is not related to other E/M service within previous 7 days.   HPI Chief Complaint  Patient presents with   Cough   Virtual consult for illness.   He reports 2-3 days of illness.  In past few years, respiratory infections takes weeks to resolve.  He is worried this one is similar to prior moderate illness.  He does get mild colds here and there but this one is feeling rough.  He notes head congestion, chest congestion, hoarse voice, some sore throat,, wheezing, drainage in the throat feeling rundown and tired, chills, in the morning getting a lot of phlegm.  Symptoms came on him at the [place].  He did a home COVID/flu test that was negative.  He notes that despite the many COVID test he has done over the past year and now is almost had, COVID test has never shown a positive which he thinks probably not accurate.  He had an illness in December and it took doxycycline, prednisone and 3 weeks to resolve  He did a  nebulizer treatment this morning.  He is using his Symbicort 2 puffs twice a day in general  He is using some Aleve.  No other aggravating or relieving factors. No other complaint.   Past Medical History:  Diagnosis Date   23-polyvalent pneumococcal polysaccharide vaccine declined 7/14   Anxiety    prior therapy with counselor, high stress job, 12+ hour work days   Asthma    ED (erectile dysfunction)    Former smoker 2021   GERD (gastroesophageal reflux disease)    pulmonology consult 2020   Hypercholesteremia    Hyperglycemia    Hypertension    Obesity    Thoracic ascending aortic aneurysm (HCC)    Wears glasses    Current Outpatient Medications on File Prior to Visit  Medication Sig Dispense Refill   budesonide-formoterol (SYMBICORT) 160-4.5 MCG/ACT inhaler Inhale 2 puffs into the lungs 2 (two) times daily. 1 each 3   ipratropium-albuterol (DUONEB) 0.5-2.5 (3) MG/3ML SOLN Take 3 mLs by nebulization every 6 (six) hours as needed. 75 mL 1   irbesartan (AVAPRO) 150 MG tablet Take 1 tablet by mouth once daily 90 tablet 3   omeprazole (PRILOSEC) 20 MG capsule Take 1 capsule by mouth once daily 30 capsule 2   rosuvastatin (CRESTOR) 10 MG tablet Take 1 tablet (10 mg total) by mouth daily. 90 tablet 3   Semaglutide-Weight Management (WEGOVY) 0.5 MG/0.5ML SOAJ Inject 0.5 mg into the skin once a week. 2 mL 0  Semaglutide-Weight Management (WEGOVY) 1 MG/0.5ML SOAJ Inject 1 mg into the skin once a week. 2 mL 0   Semaglutide-Weight Management (WEGOVY) 2.4 MG/0.75ML SOAJ Inject 2.4 mg into the skin once a week. 4 mL 2   tadalafil (CIALIS) 20 MG tablet Take 1 tablet (20 mg total) by mouth daily. as directed 8 tablet 5   valACYclovir (VALTREX) 1000 MG tablet TAKE 2 TABLETS BY MOUTH TWICE DAILY FOR 2 DAYS FOR FLARE UP 30 tablet 0   No current facility-administered medications on file prior to visit.   Review of Systems As in subjective    Objective:   Physical Exam Due to coronavirus  pandemic stay at home measures, patient visit was virtual and they were not examined in person.   There were no vitals taken for this visit.  Gen: wd, wn, nad Ill-appearing to some extent, No wheezing or shortness of breath audible      Assessment:     Encounter Diagnoses  Name Primary?   Acute cough Yes   Respiratory tract infection    Hoarse        Plan:     Given his symptoms and likelihood of flu given the flu present within the community, I will have him do Tamiflu and given his significant prior illnesses in the last year or 2, I will have him go ahead and do some doxycycline he has at home already.  He has enough for a week  Advise rest, hydration, continue Symbicort 2 puffs twice daily as usual, consider nasal saline flush and salt water gargles, can use warm tea and hot fluids to help with throat congestion and hoarseness, can use over-the-counter Mucinex plus antihistamine for drainage and mucus  I advised to go ahead and use his DuoNeb nebulizer treatment at least 2-3 times a day for the next few days  If not much improved within 72 hours then call back  Jade "RJ" was seen today for cough.  Diagnoses and all orders for this visit:  Acute cough  Respiratory tract infection  Hoarse  Other orders -     oseltamivir (TAMIFLU) 75 MG capsule; Take 1 capsule (75 mg total) by mouth 2 (two) times daily.    F/u prn

## 2024-01-05 ENCOUNTER — Encounter: Payer: Self-pay | Admitting: Gastroenterology

## 2024-01-09 ENCOUNTER — Other Ambulatory Visit: Payer: Self-pay | Admitting: Medical

## 2024-01-10 DIAGNOSIS — H0288B Meibomian gland dysfunction left eye, upper and lower eyelids: Secondary | ICD-10-CM | POA: Diagnosis not present

## 2024-01-10 DIAGNOSIS — H40013 Open angle with borderline findings, low risk, bilateral: Secondary | ICD-10-CM | POA: Diagnosis not present

## 2024-01-10 DIAGNOSIS — H04123 Dry eye syndrome of bilateral lacrimal glands: Secondary | ICD-10-CM | POA: Diagnosis not present

## 2024-01-10 DIAGNOSIS — H2513 Age-related nuclear cataract, bilateral: Secondary | ICD-10-CM | POA: Diagnosis not present

## 2024-01-14 ENCOUNTER — Other Ambulatory Visit (HOSPITAL_COMMUNITY): Payer: Self-pay

## 2024-01-17 ENCOUNTER — Other Ambulatory Visit (HOSPITAL_COMMUNITY): Payer: Self-pay

## 2024-01-17 ENCOUNTER — Telehealth: Payer: Self-pay

## 2024-01-17 NOTE — Telephone Encounter (Signed)
 Pharmacy Patient Advocate Encounter   Received notification from Patient Pharmacy that prior authorization for Wegovy 0.5MG  is required/requested.   Insurance verification completed.   The patient is insured through [facility] .   Per test claim: PA required; PA submitted to above mentioned insurance via CoverMyMeds Key/confirmation #/EOC (Key: [id number])    Status is pending

## 2024-01-18 ENCOUNTER — Encounter: Payer: Self-pay | Admitting: Medical

## 2024-01-18 ENCOUNTER — Ambulatory Visit: Payer: BC Managed Care – PPO | Admitting: Medical

## 2024-01-18 VITALS — BP 120/76 | HR 87 | Ht 70.0 in | Wt 224.0 lb

## 2024-01-18 DIAGNOSIS — N5082 Scrotal pain: Secondary | ICD-10-CM | POA: Diagnosis not present

## 2024-01-18 DIAGNOSIS — Z9852 Vasectomy status: Secondary | ICD-10-CM

## 2024-01-18 LAB — POCT URINALYSIS DIP (PROADVANTAGE DEVICE)
Bilirubin, UA: NEGATIVE
Blood, UA: NEGATIVE
Glucose, UA: NEGATIVE mg/dL
Ketones, POC UA: NEGATIVE mg/dL
Leukocytes, UA: NEGATIVE
Nitrite, UA: NEGATIVE
Protein Ur, POC: NEGATIVE mg/dL
Specific Gravity, Urine: 1.01
Urobilinogen, Ur: 0.2
pH, UA: 6 (ref 5.0–8.0)

## 2024-01-18 NOTE — Progress Notes (Signed)
 Results sent through MyChart

## 2024-01-18 NOTE — Telephone Encounter (Signed)
 Pharmacy Patient Advocate Encounter  Received notification from [facility] that Prior Authorization for  Wegovy 0.5MG   has been DENIED.  Full denial letter will be uploaded to the media tab. See denial reason below.        PA #/Case ID/Reference #: (Key: [id number])

## 2024-01-18 NOTE — Progress Notes (Signed)
 Subjective:  Cody Tapia is a 58 y.o. male who presents for Chief Complaint  Patient presents with   Follow-up    Patient is here for follow up on testicular pain.      Here for testicular pain.  He notes that he gets intermittent twinges of pain at random.  Most the time is in the right testicle.  Sometimes it radiates up to the abdomen from the right.  Sometimes both testicles or left testicle.  More often it is the right side.  He denies any recent injury or trauma, no recent activity that would aggravate the testicles.  No bike riding, motorcycle riding a horse riding.  No urinary change, no nocturia frequently, no polyuria, no polydipsia, no decreased urine stream, no hesitancy or urgency.  No blood in the urine.  No fever.  No bowel changes.  He does have a history of vasectomy at age 41.  He notes a similar scrotal pain back in his 20s but had an ultrasound of the pelvis found to be abnormal and it went away.  He denies feeling any new mass or abnormality  He had symptoms intermittent earlier this week, no pain at all yesterday but then today a little discomfort.  No other aggravating or relieving factors.    No other c/o.   Past Medical History:  Diagnosis Date   23-polyvalent pneumococcal polysaccharide vaccine declined 7/14   Anxiety    prior therapy with counselor, high stress job, 12+ hour work days   Asthma    ED (erectile dysfunction)    Former smoker 2021   GERD (gastroesophageal reflux disease)    pulmonology consult 2020   Hypercholesteremia    Hyperglycemia    Hypertension    Obesity    Thoracic ascending aortic aneurysm (HCC)    Wears glasses    Current Outpatient Medications on File Prior to Visit  Medication Sig Dispense Refill   irbesartan (AVAPRO) 150 MG tablet Take 1 tablet by mouth once daily 90 tablet 3   omeprazole (PRILOSEC) 20 MG capsule Take 1 capsule by mouth once daily 30 capsule 2   rosuvastatin (CRESTOR) 10 MG tablet Take 1  tablet (10 mg total) by mouth daily. 90 tablet 3   Semaglutide-Weight Management (WEGOVY) 2.4 MG/0.75ML SOAJ Inject 2.4 mg into the skin once a week. 4 mL 2   budesonide-formoterol (SYMBICORT) 160-4.5 MCG/ACT inhaler Inhale 2 puffs into the lungs 2 (two) times daily. (Patient not taking: Reported on 01/18/2024) 1 each 3   ipratropium-albuterol (DUONEB) 0.5-2.5 (3) MG/3ML SOLN Take 3 mLs by nebulization every 6 (six) hours as needed. (Patient not taking: Reported on 01/18/2024) 75 mL 1   Semaglutide-Weight Management (WEGOVY) 0.5 MG/0.5ML SOAJ Inject 0.5 mg into the skin once a week. (Patient not taking: Reported on 01/18/2024) 2 mL 0   Semaglutide-Weight Management (WEGOVY) 1 MG/0.5ML SOAJ Inject 1 mg into the skin once a week. (Patient not taking: Reported on 01/18/2024) 2 mL 0   tadalafil (CIALIS) 20 MG tablet Take 1 tablet (20 mg total) by mouth daily. as directed (Patient not taking: Reported on 01/18/2024) 8 tablet 5   valACYclovir (VALTREX) 1000 MG tablet TAKE 2 TABLETS BY MOUTH TWICE DAILY FOR 2 DAYS FOR FLARE UP (Patient not taking: Reported on 01/18/2024) 30 tablet 0   No current facility-administered medications on file prior to visit.   Past Surgical History:  Procedure Laterality Date   COLONOSCOPY  2009   [facility] for abdominal pain   COLONOSCOPY  12/2018   polyps TA, diverticulosis, Dr. Amada Jupiter   ESOPHAGOGASTRODUODENOSCOPY  2009   [facility], abdominal pain   EYE SURGERY     strabismus surgery, right   WISDOM TOOTH EXTRACTION      The following portions of the patient's history were reviewed and updated as appropriate: allergies, current medications, past family history, past medical history, past social history, past surgical history and problem list.  ROS Otherwise as in subjective above     Objective: BP 120/76   Pulse 87   Ht 5\' 10"  (1.778 m)   Wt 224 lb (101.6 kg)   BMI 32.14 kg/m   General appearance: alert, no distress, well developed, well  nourished Abdomen: +bs, soft, non tender, non distended, no masses, no hepatomegaly, no splenomegaly Pulses: 2+ radial pulses, 2+ pedal pulses, normal cap refill Ext: no edema GU: Normal male, no obvious scrotal mass, no other mass, no lymphadenopathy, no obvious hernia, normal exam     Assessment: Encounter Diagnoses  Name Primary?   Scrotal pain Yes   S/P vasectomy      Plan: We discussed many possibilities that would cause scrotal discomfort including infection, hernia, inflammation, muscle strain, or other.  No sign of muscle strain, no obvious orchitis, no obvious hernia.  Urinalysis normal today.  No frequent symptoms that would suggest obvious infection either such as prostatitis or orchitis or urinary tract infection.  Advised to use briefs underwear to lift up on the testicles, use some Aleve daily for the next several days to see if that helps in the event of inflammation.    Will go ahead and place referral to urology for further evaluation  Cody "RJ" was seen today for follow-up.  Diagnoses and all orders for this visit:  Scrotal pain -     POCT Urinalysis DIP (Proadvantage Device) -     Ambulatory referral to Urology  S/P vasectomy -     POCT Urinalysis DIP (Proadvantage Device) -     Ambulatory referral to Urology    Follow up: pending urology consult

## 2024-01-19 ENCOUNTER — Other Ambulatory Visit (HOSPITAL_COMMUNITY): Payer: Self-pay

## 2024-01-19 ENCOUNTER — Encounter: Payer: Self-pay | Admitting: Internal Medicine

## 2024-01-19 NOTE — Telephone Encounter (Signed)
 Left detailed message for pt on vm and sent message through [facility]

## 2024-01-24 ENCOUNTER — Other Ambulatory Visit (HOSPITAL_COMMUNITY): Payer: Self-pay

## 2024-01-26 ENCOUNTER — Other Ambulatory Visit: Payer: Self-pay | Admitting: Medical

## 2024-01-26 MED ORDER — BUDESONIDE-FORMOTEROL FUMARATE 160-4.5 MCG/ACT IN AERO: 2.0000 | INHALATION_SPRAY | Freq: Two times a day (BID) | RESPIRATORY_TRACT | 3 refills | Status: DC

## 2024-02-17 ENCOUNTER — Ambulatory Visit: Payer: BC Managed Care – PPO | Admitting: Urology

## 2024-02-29 ENCOUNTER — Encounter: Payer: Self-pay | Admitting: Medical

## 2024-02-29 DIAGNOSIS — K5730 Diverticulosis of large intestine without perforation or abscess without bleeding: Secondary | ICD-10-CM | POA: Diagnosis not present

## 2024-02-29 DIAGNOSIS — Z1211 Encounter for screening for malignant neoplasm of colon: Secondary | ICD-10-CM | POA: Diagnosis not present

## 2024-02-29 DIAGNOSIS — K635 Polyp of colon: Secondary | ICD-10-CM | POA: Diagnosis not present

## 2024-02-29 DIAGNOSIS — Z860101 Personal history of adenomatous and serrated colon polyps: Secondary | ICD-10-CM | POA: Diagnosis not present

## 2024-02-29 LAB — HM COLONOSCOPY

## 2024-03-02 ENCOUNTER — Encounter: Payer: Self-pay | Admitting: Medical

## 2024-03-02 DIAGNOSIS — L820 Inflamed seborrheic keratosis: Secondary | ICD-10-CM | POA: Diagnosis not present

## 2024-03-03 ENCOUNTER — Encounter: Payer: Self-pay | Admitting: Urology

## 2024-03-03 ENCOUNTER — Ambulatory Visit: Admitting: Urology

## 2024-03-03 VITALS — BP 119/78 | HR 97 | Ht 70.0 in | Wt 215.0 lb

## 2024-03-03 DIAGNOSIS — N5082 Scrotal pain: Secondary | ICD-10-CM | POA: Diagnosis not present

## 2024-03-03 LAB — URINALYSIS, ROUTINE W REFLEX MICROSCOPIC
Bilirubin, UA: NEGATIVE
Glucose, UA: NEGATIVE
Nitrite, UA: NEGATIVE
Protein,UA: NEGATIVE
RBC, UA: NEGATIVE
Specific Gravity, UA: >1.03 — ABNORMAL HIGH (ref 1.005–1.030)
Urobilinogen, Ur: 0.2 mg/dL (ref 0.2–1.0)
pH, UA: 5 (ref 5.0–7.5)

## 2024-03-03 LAB — MICROSCOPIC EXAMINATION

## 2024-03-03 MED ORDER — MELOXICAM 7.5 MG PO TABS: 7.5000 mg | ORAL_TABLET | Freq: Every day | ORAL | 1 refills | Status: AC

## 2024-03-03 NOTE — Progress Notes (Signed)
 Assessment: 1. Scrotal pain, right     Plan: I personally reviewed the patient's chart including provider notes, and lab results. I discussed possible causes for his right scrotal discomfort. Recommend further evaluation with a scrotal ultrasound.  Will call him with results. Begin meloxicam 7.5 mg daily x 14 days.  Prescription sent.  Chief Complaint:  Chief Complaint  Patient presents with   Testicle Pain    History of Present Illness:  Cody Tapia is a 58 y.o. male who is seen in consultation from Jac Canavan, PA-C for evaluation of scrotal pain. He has had intermittent right scrotal pain for approximately 1 year.  His symptoms typically start in the right scrotum with some radiation into the right groin and lower abdomen.  His symptoms are intermittent occurring several times per week.  He does not associate the discomfort with movement, position, or any activity.  He currently reports it as a "dull ache".  His symptoms may last for several hours and resolve spontaneously.  No scrotal swelling or redness.  No history of scrotal trauma.  He has not had any imaging studies. He has lower urinary tract symptoms of frequency and decreased stream with urgency and occasional nocturia.  No dysuria or gross hematuria. IPSS = 10/2.  PSA from 9/24: 0.4  Past Medical History:  Past Medical History:  Diagnosis Date   23-polyvalent pneumococcal polysaccharide vaccine declined 7/14   Anxiety    prior therapy with counselor, high stress job, 12+ hour work days   Asthma    ED (erectile dysfunction)    Former smoker 2021   GERD (gastroesophageal reflux disease)    pulmonology consult 2020   Hypercholesteremia    Hyperglycemia    Hypertension    Obesity    Thoracic ascending aortic aneurysm (HCC)    Wears glasses     Past Surgical History:  Past Surgical History:  Procedure Laterality Date   COLONOSCOPY  2009   [facility] for abdominal pain   COLONOSCOPY   12/2018   polyps TA, diverticulosis, Dr. Amada Jupiter   ESOPHAGOGASTRODUODENOSCOPY  2009   [facility], abdominal pain   EYE SURGERY     strabismus surgery, right   WISDOM TOOTH EXTRACTION      Allergies:  Allergies  Allergen Reactions   Augmentin [Amoxicillin-Pot Clavulanate]     Bad diarrhea/GI upset   Qsymia [Phentermine-Topiramate]     Diarrhea     Family History:  Family History  Problem Relation Age of Onset   Cancer Mother        lung   COPD Mother    Heart disease Father 50       stent, CAD   Stroke Father    Cancer Brother 92       prostate   Cancer Maternal Grandfather        prostate   Sudden death Neg Hx    Heart attack Neg Hx    Hyperlipidemia Neg Hx    Diabetes Neg Hx    Hypertension Neg Hx     Social History:  Social History   Tobacco Use   Smoking status: Former    Current packs/day: 0.00    Average packs/day: 0.3 packs/day for 30.0 years (7.5 ttl pk-yrs)    Types: Cigarettes    Start date: 10/13/1989    Quit date: 10/14/2019    Years since quitting: 4.3   Smokeless tobacco: Never  Vaping Use   Vaping status: Never Used  Substance Use  Topics   Alcohol use: Not Currently    Alcohol/week: 3.0 standard drinks of alcohol    Types: 3 Glasses of wine per week   Drug use: No    Review of symptoms:  Constitutional:  Negative for unexplained weight loss, night sweats, fever, chills ENT:  Negative for nose bleeds, sinus pain, painful swallowing CV:  Negative for chest pain, shortness of breath, exercise intolerance, palpitations, loss of consciousness Resp:  Negative for cough, wheezing, shortness of breath GI:  Negative for nausea, vomiting, diarrhea, bloody stools GU:  Positives noted in HPI; otherwise negative for gross hematuria, dysuria, urinary incontinence Neuro:  Negative for seizures, poor balance, limb weakness, slurred speech Psych:  Negative for lack of energy, depression, anxiety Endocrine:  Negative for polydipsia, polyuria,  symptoms of hypoglycemia (dizziness, hunger, sweating) Hematologic:  Negative for anemia, purpura, petechia, prolonged or excessive bleeding, use of anticoagulants  Allergic:  Negative for difficulty breathing or choking as a result of exposure to anything; no shellfish allergy; no allergic response (rash/itch) to materials, foods  Physical exam: BP 119/78   Pulse 97   Ht 5\' 10"  (1.778 m)   Wt 215 lb (97.5 kg)   BMI 30.85 kg/m  GENERAL APPEARANCE:  Well appearing, well developed, well nourished, NAD HEENT: Atraumatic, Normocephalic, oropharynx clear. NECK: Supple without lymphadenopathy or thyromegaly. LUNGS: Clear to auscultation bilaterally. HEART: Regular Rate and Rhythm without murmurs, gallops, or rubs. ABDOMEN: Soft, non-tender, No Masses. EXTREMITIES: Moves all extremities well.  Without clubbing, cyanosis, or edema. NEUROLOGIC:  Alert and oriented x 3, normal gait, CN II-XII grossly intact.  MENTAL STATUS:  Appropriate. BACK:  Non-tender to palpation.  No CVAT SKIN:  Warm, dry and intact.   GU: Penis:  circumcised Meatus: Normal Scrotum: No erythema or edema; no hernia palpated; no tenderness at inguinal ring Testis: normal without masses bilateral Epididymis: normal  Results: U/A: 0-5 WBCs, 0-2 RBCs

## 2024-03-06 ENCOUNTER — Ambulatory Visit (HOSPITAL_BASED_OUTPATIENT_CLINIC_OR_DEPARTMENT_OTHER)
Admission: RE | Admit: 2024-03-06 | Discharge: 2024-03-06 | Disposition: A | Discharge status: Home / Self Care | Source: Ambulatory Visit | Attending: Urology | Admitting: Urology

## 2024-03-06 DIAGNOSIS — N5082 Scrotal pain: Secondary | ICD-10-CM | POA: Insufficient documentation

## 2024-03-13 ENCOUNTER — Telehealth: Payer: Self-pay | Admitting: Urology

## 2024-03-13 ENCOUNTER — Encounter: Payer: Self-pay | Admitting: Urology

## 2024-03-13 NOTE — Telephone Encounter (Signed)
 Pt called requesting results for Ultrasound. Please Advise.

## 2024-04-11 ENCOUNTER — Other Ambulatory Visit: Payer: Self-pay | Admitting: Cardiology

## 2024-04-11 DIAGNOSIS — I7121 Aneurysm of the ascending aorta, without rupture: Secondary | ICD-10-CM

## 2024-04-11 DIAGNOSIS — I10 Essential (primary) hypertension: Secondary | ICD-10-CM

## 2024-04-12 ENCOUNTER — Other Ambulatory Visit: Payer: Self-pay | Admitting: Medical

## 2024-04-12 NOTE — Telephone Encounter (Signed)
 Patient needs refill.

## 2024-04-13 ENCOUNTER — Other Ambulatory Visit: Payer: Self-pay | Admitting: Medical

## 2024-04-27 ENCOUNTER — Other Ambulatory Visit: Payer: BC Managed Care – PPO

## 2024-05-01 ENCOUNTER — Ambulatory Visit: Payer: Self-pay | Admitting: Cardiology

## 2024-05-01 ENCOUNTER — Ambulatory Visit
Admission: RE | Admit: 2024-05-01 | Discharge: 2024-05-01 | Disposition: A | Discharge status: Home / Self Care | Payer: BC Managed Care – PPO | Source: Ambulatory Visit | Attending: Cardiology | Admitting: Cardiology

## 2024-05-01 DIAGNOSIS — I77810 Thoracic aortic ectasia: Secondary | ICD-10-CM | POA: Diagnosis not present

## 2024-05-01 DIAGNOSIS — I7121 Aneurysm of the ascending aorta, without rupture: Secondary | ICD-10-CM

## 2024-05-01 MED ORDER — IOPAMIDOL (ISOVUE-370) INJECTION 76%
75.0000 mL | Freq: Once | INTRAVENOUS | Status: AC | PRN
  Administered 2024-05-01: 75 mL via INTRAVENOUS

## 2024-05-01 NOTE — Progress Notes (Signed)
 Is not aortic aneurysm or mild dilatation has been stable since 2019.  Greater than 5 years of stability suggest lower likelihood of developing into a full-blown aortic aneurysm, do not think he needs annual surveillance going forward.

## 2024-05-02 NOTE — Telephone Encounter (Signed)
 Please review and advise.

## 2024-06-07 DIAGNOSIS — H0015 Chalazion left lower eyelid: Secondary | ICD-10-CM | POA: Diagnosis not present

## 2024-06-13 ENCOUNTER — Other Ambulatory Visit: Payer: Self-pay | Admitting: Medical

## 2024-06-19 ENCOUNTER — Other Ambulatory Visit: Payer: Self-pay | Admitting: Medical

## 2024-06-19 DIAGNOSIS — M67912 Unspecified disorder of synovium and tendon, left shoulder: Secondary | ICD-10-CM | POA: Diagnosis not present

## 2024-06-30 DIAGNOSIS — M25512 Pain in left shoulder: Secondary | ICD-10-CM | POA: Diagnosis not present

## 2024-07-10 DIAGNOSIS — M67912 Unspecified disorder of synovium and tendon, left shoulder: Secondary | ICD-10-CM | POA: Diagnosis not present

## 2024-07-11 DIAGNOSIS — H0288A Meibomian gland dysfunction right eye, upper and lower eyelids: Secondary | ICD-10-CM | POA: Diagnosis not present

## 2024-07-11 DIAGNOSIS — H40013 Open angle with borderline findings, low risk, bilateral: Secondary | ICD-10-CM | POA: Diagnosis not present

## 2024-07-11 DIAGNOSIS — H5201 Hypermetropia, right eye: Secondary | ICD-10-CM | POA: Diagnosis not present

## 2024-07-11 DIAGNOSIS — H2513 Age-related nuclear cataract, bilateral: Secondary | ICD-10-CM | POA: Diagnosis not present

## 2024-07-11 DIAGNOSIS — H524 Presbyopia: Secondary | ICD-10-CM | POA: Diagnosis not present

## 2024-07-11 DIAGNOSIS — H0288B Meibomian gland dysfunction left eye, upper and lower eyelids: Secondary | ICD-10-CM | POA: Diagnosis not present

## 2024-07-11 DIAGNOSIS — H52223 Regular astigmatism, bilateral: Secondary | ICD-10-CM | POA: Diagnosis not present

## 2024-07-13 DIAGNOSIS — L570 Actinic keratosis: Secondary | ICD-10-CM | POA: Diagnosis not present

## 2024-07-13 DIAGNOSIS — Z85828 Personal history of other malignant neoplasm of skin: Secondary | ICD-10-CM | POA: Diagnosis not present

## 2024-07-13 DIAGNOSIS — L821 Other seborrheic keratosis: Secondary | ICD-10-CM | POA: Diagnosis not present

## 2024-07-13 DIAGNOSIS — D3611 Benign neoplasm of peripheral nerves and autonomic nervous system of face, head, and neck: Secondary | ICD-10-CM | POA: Diagnosis not present

## 2024-07-13 DIAGNOSIS — D485 Neoplasm of uncertain behavior of skin: Secondary | ICD-10-CM | POA: Diagnosis not present

## 2024-07-13 DIAGNOSIS — D1801 Hemangioma of skin and subcutaneous tissue: Secondary | ICD-10-CM | POA: Diagnosis not present

## 2024-07-17 ENCOUNTER — Other Ambulatory Visit: Payer: Self-pay | Admitting: Medical

## 2024-07-17 DIAGNOSIS — E7800 Pure hypercholesterolemia, unspecified: Secondary | ICD-10-CM

## 2024-08-07 ENCOUNTER — Telehealth: Payer: Self-pay | Admitting: Internal Medicine

## 2024-08-07 ENCOUNTER — Other Ambulatory Visit: Payer: Self-pay | Admitting: Medical

## 2024-08-07 DIAGNOSIS — Z131 Encounter for screening for diabetes mellitus: Secondary | ICD-10-CM

## 2024-08-07 DIAGNOSIS — Z125 Encounter for screening for malignant neoplasm of prostate: Secondary | ICD-10-CM

## 2024-08-07 DIAGNOSIS — I10 Essential (primary) hypertension: Secondary | ICD-10-CM

## 2024-08-07 DIAGNOSIS — E785 Hyperlipidemia, unspecified: Secondary | ICD-10-CM

## 2024-08-07 DIAGNOSIS — Z0000 Encounter for general adult medical examination without abnormal findings: Secondary | ICD-10-CM

## 2024-08-07 NOTE — Telephone Encounter (Signed)
 Pt coming in the morning for labs  Copied from CRM #[id number]. Topic: Clinical - Request for Lab/Test Order >> Aug 07, 2024 11:03 AM Cody Tapia wrote: Reason for CRM: Patient is requesting blood work before his physical on 9/16 at 1:45pm. Please contact the patient at [phone]

## 2024-08-08 ENCOUNTER — Ambulatory Visit (INDEPENDENT_AMBULATORY_CARE_PROVIDER_SITE_OTHER): Admitting: Medical

## 2024-08-08 ENCOUNTER — Other Ambulatory Visit (HOSPITAL_COMMUNITY): Payer: Self-pay

## 2024-08-08 ENCOUNTER — Other Ambulatory Visit

## 2024-08-08 VITALS — BP 120/74 | HR 86 | Ht 69.5 in | Wt 226.2 lb

## 2024-08-08 DIAGNOSIS — Z0000 Encounter for general adult medical examination without abnormal findings: Secondary | ICD-10-CM | POA: Diagnosis not present

## 2024-08-08 DIAGNOSIS — Z131 Encounter for screening for diabetes mellitus: Secondary | ICD-10-CM | POA: Diagnosis not present

## 2024-08-08 DIAGNOSIS — I10 Essential (primary) hypertension: Secondary | ICD-10-CM | POA: Diagnosis not present

## 2024-08-08 DIAGNOSIS — R5383 Other fatigue: Secondary | ICD-10-CM | POA: Diagnosis not present

## 2024-08-08 DIAGNOSIS — Z125 Encounter for screening for malignant neoplasm of prostate: Secondary | ICD-10-CM

## 2024-08-08 DIAGNOSIS — Z23 Encounter for immunization: Secondary | ICD-10-CM

## 2024-08-08 DIAGNOSIS — E785 Hyperlipidemia, unspecified: Secondary | ICD-10-CM

## 2024-08-08 MED ORDER — TIRZEPATIDE-WEIGHT MANAGEMENT 5 MG/0.5ML ~~LOC~~ SOAJ
5.0000 mg | SUBCUTANEOUS | 0 refills | Status: DC
  Filled 2024-08-08: qty 2, 28d supply, fill #0

## 2024-08-08 MED ORDER — TIRZEPATIDE-WEIGHT MANAGEMENT 2.5 MG/0.5ML ~~LOC~~ SOAJ
2.5000 mg | SUBCUTANEOUS | 0 refills | Status: DC
  Filled 2024-08-08 – 2024-08-10 (×2): qty 2, 28d supply, fill #0

## 2024-08-08 NOTE — Progress Notes (Signed)
 Subjective:   HPI  Cody Tapia is a 58 y.o. male who presents for Chief Complaint  Patient presents with   Annual Exam    Cpe, had fasting labs done this morning. Due for pneumonia,tdap and flu. Would like to get flu and pneumonia if its not going to make him sick    Patient Care Team: Amarie Viles, Alm RAMAN, PA-C as PCP - General (Family Medicine) Ladona Heinz, MD as PCP - Cardiology (Cardiology) Sees dentist Sees eye doctor Dr. Heinz Ladona, cardiology Dr. Cy Brochure, general surgery Dr. Victory Brand, GI   Concerns: Hypertension-compliant with irbesartan  150 mg daily.    Hyperlipidemia-compliant with rosuvastatin  10 mg daily  Asthma-has Symbicort  but hasn't had to use it lately.  Has albuterol  for prn use.  He was on wegovy  but sick in December and not able to exercise much when he was sick.  Thus, he wasn't losing weight during that time and insurance declined to pay for wegovy .  Reviewed their medical, surgical, family, social, medication, and allergy history and updated chart as appropriate.  Past Medical History:  Diagnosis Date   23-polyvalent pneumococcal polysaccharide vaccine declined 7/14   Anxiety    prior therapy with counselor, high stress job, 12+ hour work days   Asthma    ED (erectile dysfunction)    Former smoker 2021   GERD (gastroesophageal reflux disease)    pulmonology consult 2020   Hypercholesteremia    Hyperglycemia    Hypertension    Obesity    Thoracic ascending aortic aneurysm (HCC)    Wears glasses     Past Surgical History:  Procedure Laterality Date   COLONOSCOPY  2009   [facility] for abdominal pain   COLONOSCOPY  12/2018   polyps TA, diverticulosis, Dr. Victory Brand   ESOPHAGOGASTRODUODENOSCOPY  2009   [facility], abdominal pain   EYE SURGERY     strabismus surgery, right   WISDOM TOOTH EXTRACTION      Family History  Problem Relation Age of Onset   Cancer Mother        lung   COPD Mother    Heart disease  Father 40       stent, CAD   Stroke Father    Cancer Brother 35       prostate   Cancer Maternal Grandfather        prostate   Sudden death Neg Hx    Heart attack Neg Hx    Hyperlipidemia Neg Hx    Diabetes Neg Hx    Hypertension Neg Hx      Current Outpatient Medications:    budesonide -formoterol  (SYMBICORT ) 160-4.5 MCG/ACT inhaler, Inhale 2 puffs into the lungs 2 (two) times daily., Disp: 1 each, Rfl: 3   ipratropium-albuterol  (DUONEB) 0.5-2.5 (3) MG/3ML SOLN, Take 3 mLs by nebulization every 6 (six) hours as needed., Disp: 75 mL, Rfl: 1   irbesartan  (AVAPRO ) 150 MG tablet, Take 1 tablet by mouth once daily, Disp: 90 tablet, Rfl: 3   omeprazole  (PRILOSEC) 20 MG capsule, Take 1 capsule by mouth once daily, Disp: 30 capsule, Rfl: 2   rosuvastatin  (CRESTOR ) 10 MG tablet, Take 1 tablet by mouth once daily, Disp: 90 tablet, Rfl: 0   tadalafil  (CIALIS ) 20 MG tablet, TAKE 1 TABLET BY MOUTH ONCE DAILY AS DIRECTED, Disp: 8 tablet, Rfl: 0   tirzepatide  (ZEPBOUND ) 2.5 MG/0.5ML Pen, Inject 2.5 mg into the skin once a week., Disp: 2 mL, Rfl: 0   tirzepatide  (ZEPBOUND ) 5 MG/0.5ML Pen,  Inject 5 mg into the skin once a week., Disp: 2 mL, Rfl: 0   valACYclovir  (VALTREX ) 1000 MG tablet, TAKE 2 TABLETS BY MOUTH TWICE DAILY FOR 2 DAYS FOR FLARE UP, Disp: 30 tablet, Rfl: 0   meloxicam  (MOBIC ) 7.5 MG tablet, Take 1 tablet (7.5 mg total) by mouth daily. (Patient not taking: Reported on 08/08/2024), Disp: 14 tablet, Rfl: 1  Allergies  Allergen Reactions   Augmentin [Amoxicillin -Pot Clavulanate]     Bad diarrhea/GI upset   Qsymia  [Phentermine -Topiramate Er]     Diarrhea     Review of Systems  Constitutional:  Positive for malaise/fatigue. Negative for chills, fever and weight loss.  HENT:  Negative for congestion, ear pain, hearing loss, sore throat and tinnitus.   Eyes:  Negative for blurred vision, pain and redness.  Respiratory:  Negative for cough, hemoptysis and shortness of breath.    Cardiovascular:  Negative for chest pain, palpitations, orthopnea, claudication and leg swelling.  Gastrointestinal:  Negative for abdominal pain, blood in stool, constipation, diarrhea, nausea and vomiting.  Genitourinary:  Negative for dysuria, flank pain, frequency, hematuria and urgency.  Musculoskeletal:  Negative for falls, joint pain and myalgias.  Skin:  Negative for itching and rash.  Neurological:  Negative for dizziness, tingling, speech change, weakness and headaches.  Endo/Heme/Allergies:  Negative for polydipsia. Does not bruise/bleed easily.  Psychiatric/Behavioral:  Negative for depression and memory loss. The patient is not nervous/anxious and does not have insomnia.        08/08/2024    2:00 PM 08/05/2023    8:48 AM 07/07/2022    2:58 PM 04/23/2021    9:25 AM 01/10/2020    8:50 AM  Depression screen PHQ 2/9  Decreased Interest 0 0 0 0 0  Down, Depressed, Hopeless 0 0 0 1 0  PHQ - 2 Score 0 0 0 1 0  Altered sleeping    1   Tired, decreased energy    2   Change in appetite    2   Feeling bad or failure about yourself     2   Trouble concentrating    0   Moving slowly or fidgety/restless    0   Suicidal thoughts    0   PHQ-9 Score    8   Difficult doing work/chores    Not difficult at all         Objective:  BP 120/74   Pulse 86   Ht 5' 9.5 (1.765 m)   Wt 226 lb 3.2 oz (102.6 kg)   BMI 32.92 kg/m   Wt Readings from Last 3 Encounters:  08/08/24 226 lb 3.2 oz (102.6 kg)  03/03/24 215 lb (97.5 kg)  01/18/24 224 lb (101.6 kg)   Wt Readings from Last 3 Encounters:  08/08/24 226 lb 3.2 oz (102.6 kg)  03/03/24 215 lb (97.5 kg)  01/18/24 224 lb (101.6 kg)    General appearance: alert, no distress, WD/WN, Caucasian male Skin: unremarkable HEENT: normocephalic, conjunctiva/corneas normal, sclerae anicteric, PERRLA, EOMi, nares patent, no discharge or erythema, pharynx normal Oral cavity: MMM, tongue normal, teeth normal Neck: supple, no lymphadenopathy, no  thyromegaly, no masses, normal ROM, no bruits Chest: non tender, normal shape and expansion Heart: RRR, normal S1, S2, no murmurs Lungs: CTA bilaterally, no wheezes, rhonchi, or rales Abdomen: +bs, soft, non tender, non distended, no masses, no hepatomegaly, no splenomegaly, no bruits Back: non tender, normal ROM, no scoliosis Musculoskeletal: upper extremities non tender, no obvious deformity, normal ROM throughout,  lower extremities non tender, no obvious deformity, normal ROM throughout Extremities: no edema, no cyanosis, no clubbing Pulses: 2+ symmetric, upper and lower extremities, normal cap refill Neurological: alert, oriented x 3, CN2-12 intact, strength normal upper extremities and lower extremities, sensation normal throughout, DTRs 2+ throughout, no cerebellar signs, gait normal Psychiatric: normal affect, behavior normal, pleasant  GU: normal male external genitalia,circumcised, nontender, no masses, no hernia, no lymphadenopathy Rectal: deferred   Assessment and Plan :   Encounter Diagnoses  Name Primary?   Primary hypertension    Routine general medical examination at a health care facility    Screening for diabetes mellitus    Screening for prostate cancer    Hyperlipidemia, unspecified hyperlipidemia type    Other fatigue Yes   Need for pneumococcal vaccine    Needs flu shot      This visit was a preventative care visit, also known as wellness visit or routine physical.   Topics typically include healthy lifestyle, diet, exercise, preventative care, vaccinations, sick and well care, proper use of emergency dept and after hours care, as well as other concerns.     Recommendations: Continue to return yearly for your annual wellness and preventative care visits.  This gives us  a chance to discuss healthy lifestyle, exercise, vaccinations, review your chart record, and perform screenings where appropriate.  I recommend you see your eye doctor yearly for routine vision  care.  I recommend you see your dentist yearly for routine dental care including hygiene visits twice yearly.   Vaccination recommendations were reviewed Immunization History  Administered Date(s) Administered   Influenza, Seasonal, Injecte, Preservative Fre 08/08/2024   Influenza,inj,Quad PF,6+ Mos 10/18/2018, 12/13/2019   PFIZER Comirnaty(Gray Top)Covid-19 Tri-Sucrose Vaccine 04/23/2021   PFIZER(Purple Top)SARS-COV-2 Vaccination 01/31/2020, 02/21/2020, 09/20/2020   PNEUMOCOCCAL CONJUGATE-20 08/08/2024   Pneumococcal Polysaccharide-23 01/14/2017   Tdap 06/21/2013    Vaccine recommendations: Tdap tetanus Shingles Yearly influenza pneumococcal  Vaccines administered today: Counseled on the influenza virus vaccine.  Vaccine information sheet given.  Influenza vaccine given after consent obtained.  Counseled on the pneumococcal vaccine.  Vaccine information sheet given.  Pneumococcal vaccine Prevnar 20 given after consent obtained.    Screening for cancer: Colon cancer screening: I reviewed your 2025 colonoscopy.  We discussed PSA, prostate exam, and prostate cancer screening risks/benefits.     Skin cancer screening: Check your skin regularly for new changes, growing lesions, or other lesions of concern Come in for evaluation if you have skin lesions of concern.  Lung cancer screening: If you have a greater than 20 pack year history of tobacco use, then you may qualify for lung cancer screening with a chest CT scan.   Please call your insurance company to inquire about coverage for this test.  We currently don't have screenings for other cancers besides breast, cervical, colon, and lung cancers.  If you have a strong family history of cancer or have other cancer screening concerns, please let me know.    Bone health: Get at least 150 minutes of aerobic exercise weekly Get weight bearing exercise at least once weekly Bone density test:  A bone density test is an imaging  test that uses a type of X-Breydan to measure the amount of calcium  and other minerals in your bones. The test may be used to diagnose or screen you for a condition that causes weak or thin bones (osteoporosis), predict your risk for a broken bone (fracture), or determine how well your osteoporosis treatment is working. The bone density  test is recommended for females 65 and older, or females or males <65 if certain risk factors such as thyroid  disease, long term use of steroids such as for asthma or rheumatological issues, vitamin D  deficiency, estrogen deficiency, family history of osteoporosis, self or family history of fragility fracture in first degree relative.    Heart health: Get at least 150 minutes of aerobic exercise weekly Limit alcohol It is important to maintain a healthy blood pressure and healthy cholesterol numbers  Heart disease screening: Screening for heart disease includes screening for blood pressure, fasting lipids, glucose/diabetes screening, BMI height to weight ratio, reviewed of smoking status, physical activity, and diet.    Goals include blood pressure 120/80 or less, maintaining a healthy lipid/cholesterol profile, preventing diabetes or keeping diabetes numbers under good control, not smoking or using tobacco products, exercising most days per week or at least 150 minutes per week of exercise, and eating healthy variety of fruits and vegetables, healthy oils, and avoiding unhealthy food choices like fried food, fast food, high sugar and high cholesterol foods.     Medical care options: I recommend you continue to seek care here first for routine care.  We try really hard to have available appointments Monday through Friday daytime hours for sick visits, acute visits, and physicals.  Urgent care should be used for after hours and weekends for significant issues that cannot wait till the next day.  The emergency department should be used for significant potentially  life-threatening emergencies.  The emergency department is expensive, can often have long wait times for less significant concerns, so try to utilize primary care, urgent care, or telemedicine when possible to avoid unnecessary trips to the emergency department.  Virtual visits and telemedicine have been introduced since the pandemic started in 2020, and can be convenient ways to receive medical care.  We offer virtual appointments as well to assist you in a variety of options to seek medical care.   Advanced Directives: I recommend you consider completing a Health Care Power of Attorney and Living Will.   These documents respect your wishes and help alleviate burdens on your loved ones if you were to become terminally ill or be in a position to need those documents enforced.    You can complete Advanced Directives yourself, have them notarized, then have copies made for our office, for you and for anybody you feel should have them in safe keeping.  Or, you can have an attorney prepare these documents.   If you haven't updated your Last Will and Testament in a while, it may be worthwhile having an attorney prepare these documents together and save on some costs.       Separate significant issues discussed: Hypertension-continue irbesartan  150 mg daily  Hyperlipidemia-continue rosuvastatin  Crestor  10 mg daily  History of cold sores-continue Valtrex  prn  Asthma-continue Symbicort  twice daily for maintenance in worse periods of symptoms, albuterol  rescue inhaler as needed  BMI elevated -begin trial of Zepbound  injection along with healthy diet and exercise  ED - continue Cialis  prn  He continues to be concerned about weight, fatigue.  Updated labs today.  Counseled on supplements, diet, exercise.  Cody Tapia was seen today for annual exam.  Diagnoses and all orders for this visit:  Other fatigue  Primary hypertension -     Urinalysis, Routine w reflex microscopic  Routine general  medical examination at a health care facility -     Hemoglobin A1c -     TSH -  PSA -     Lipid panel -     Comprehensive metabolic panel with GFR -     CBC -     Testosterone   Screening for diabetes mellitus -     Hemoglobin A1c  Screening for prostate cancer -     PSA  Hyperlipidemia, unspecified hyperlipidemia type -     Lipid panel  Need for pneumococcal vaccine -     Pneumococcal conjugate vaccine 20-valent (Prevnar 20)  Needs flu shot -     Flu vaccine trivalent PF, 6mos and older(Flulaval,Afluria,Fluarix,Fluzone)  Other orders -     tirzepatide  (ZEPBOUND ) 2.5 MG/0.5ML Pen; Inject 2.5 mg into the skin once a week. -     tirzepatide  (ZEPBOUND ) 5 MG/0.5ML Pen; Inject 5 mg into the skin once a week.    Follow-up pending labs, yearly for physical

## 2024-08-09 ENCOUNTER — Other Ambulatory Visit (HOSPITAL_COMMUNITY): Payer: Self-pay

## 2024-08-09 ENCOUNTER — Other Ambulatory Visit: Payer: Self-pay | Admitting: Medical

## 2024-08-09 ENCOUNTER — Telehealth: Payer: Self-pay

## 2024-08-09 ENCOUNTER — Encounter (HOSPITAL_COMMUNITY): Payer: Self-pay

## 2024-08-09 ENCOUNTER — Ambulatory Visit: Payer: Self-pay | Admitting: Medical

## 2024-08-09 LAB — COMPREHENSIVE METABOLIC PANEL WITH GFR
ALT: 19 IU/L (ref 0–44)
AST: 17 IU/L (ref 0–40)
Albumin: 4.2 g/dL (ref 3.8–4.9)
Alkaline Phosphatase: 103 IU/L (ref 47–123)
BUN/Creatinine Ratio: 16 (ref 9–20)
BUN: 16 mg/dL (ref 6–24)
Bilirubin Total: 0.6 mg/dL (ref 0.0–1.2)
CO2: 20 mmol/L (ref 20–29)
Calcium: 8.9 mg/dL (ref 8.7–10.2)
Chloride: 101 mmol/L (ref 96–106)
Creatinine, Ser: 1 mg/dL (ref 0.76–1.27)
Globulin, Total: 2.6 g/dL (ref 1.5–4.5)
Glucose: 97 mg/dL (ref 70–99)
Potassium: 4.1 mmol/L (ref 3.5–5.2)
Sodium: 136 mmol/L (ref 134–144)
Total Protein: 6.8 g/dL (ref 6.0–8.5)
eGFR: 88 mL/min/1.73 (ref >59)

## 2024-08-09 LAB — HEMOGLOBIN A1C
Est. average glucose Bld gHb Est-mCnc: 111 mg/dL
Hgb A1c MFr Bld: 5.5 % (ref 4.8–5.6)

## 2024-08-09 LAB — URINALYSIS, ROUTINE W REFLEX MICROSCOPIC
Bilirubin, UA: NEGATIVE
Glucose, UA: NEGATIVE
Ketones, UA: NEGATIVE
Leukocytes,UA: NEGATIVE
Nitrite, UA: NEGATIVE
Protein,UA: NEGATIVE
RBC, UA: NEGATIVE
Specific Gravity, UA: 1.022 (ref 1.005–1.030)
Urobilinogen, Ur: 0.2 mg/dL (ref 0.2–1.0)
pH, UA: 5.5 (ref 5.0–7.5)

## 2024-08-09 LAB — CBC
Hematocrit: 47.3 % (ref 37.5–51.0)
Hemoglobin: 15.7 g/dL (ref 13.0–17.7)
MCH: 31.8 pg (ref 26.6–33.0)
MCHC: 33.2 g/dL (ref 31.5–35.7)
MCV: 96 fL (ref 79–97)
Platelets: 284 x10E3/uL (ref 150–450)
RBC: 4.93 x10E6/uL (ref 4.14–5.80)
RDW: 13.8 % (ref 11.6–15.4)
WBC: 7.5 x10E3/uL (ref 3.4–10.8)

## 2024-08-09 LAB — LIPID PANEL
Chol/HDL Ratio: 4.9 ratio (ref 0.0–5.0)
Cholesterol, Total: 210 mg/dL — ABNORMAL HIGH (ref 100–199)
HDL: 43 mg/dL (ref >39)
LDL Chol Calc (NIH): 134 mg/dL — ABNORMAL HIGH (ref 0–99)
Triglycerides: 183 mg/dL — ABNORMAL HIGH (ref 0–149)
VLDL Cholesterol Cal: 33 mg/dL (ref 5–40)

## 2024-08-09 LAB — PSA: Prostate Specific Ag, Serum: 0.4 ng/mL (ref 0.0–4.0)

## 2024-08-09 LAB — TSH: TSH: 2.77 u[IU]/mL (ref 0.450–4.500)

## 2024-08-09 NOTE — Progress Notes (Signed)
 Results through MyChart

## 2024-08-09 NOTE — Telephone Encounter (Signed)
 Pharmacy Patient Advocate Encounter   Received notification from Onbase that prior authorization for Zepbound  2.5MG /0.5ML pen-injectors  is required/requested.   Insurance verification completed.   The patient is insured through [facility] .   Per test claim: PA required; PA submitted to above mentioned insurance via Latent Key/confirmation #/EOC [id number] Status is pending

## 2024-08-10 ENCOUNTER — Other Ambulatory Visit (HOSPITAL_COMMUNITY): Payer: Self-pay

## 2024-08-10 ENCOUNTER — Encounter (HOSPITAL_COMMUNITY): Payer: Self-pay

## 2024-08-10 NOTE — Telephone Encounter (Signed)
 Pharmacy Patient Advocate Encounter  Received notification from [employer] that Prior Authorization for Zepbound  2.5MG /0.5ML pen-injectors  has been APPROVED to 3.17.26. Ran test claim, Copay is $24.99. This test claim was processed through [facility]- copay amounts may vary at other pharmacies due to pharmacy/plan contracts, or as the patient moves through the different stages of their insurance plan.   PA #/Case ID/Reference #: [id number]

## 2024-08-21 DIAGNOSIS — M67912 Unspecified disorder of synovium and tendon, left shoulder: Secondary | ICD-10-CM | POA: Diagnosis not present

## 2024-08-24 DIAGNOSIS — M7542 Impingement syndrome of left shoulder: Secondary | ICD-10-CM | POA: Diagnosis not present

## 2024-08-26 ENCOUNTER — Other Ambulatory Visit: Payer: Self-pay | Admitting: Medical

## 2024-08-30 DIAGNOSIS — M7542 Impingement syndrome of left shoulder: Secondary | ICD-10-CM | POA: Diagnosis not present

## 2024-08-31 LAB — TESTOSTERONE: Testosterone: 465 ng/dL (ref 264–916)

## 2024-08-31 LAB — SPECIMEN STATUS REPORT

## 2024-09-01 ENCOUNTER — Other Ambulatory Visit: Payer: Self-pay | Admitting: Medical

## 2024-09-01 ENCOUNTER — Other Ambulatory Visit (HOSPITAL_COMMUNITY): Payer: Self-pay

## 2024-09-01 ENCOUNTER — Encounter (HOSPITAL_COMMUNITY): Payer: Self-pay

## 2024-09-01 NOTE — Telephone Encounter (Signed)
 Pt was increased to 5mg  after 2.5mg . will deny this

## 2024-09-03 ENCOUNTER — Other Ambulatory Visit: Payer: Self-pay | Admitting: Medical

## 2024-09-03 MED ORDER — TIRZEPATIDE-WEIGHT MANAGEMENT 5 MG/0.5ML ~~LOC~~ SOAJ: 5.0000 mg | SUBCUTANEOUS | 0 refills | Status: DC

## 2024-09-05 DIAGNOSIS — M7542 Impingement syndrome of left shoulder: Secondary | ICD-10-CM | POA: Diagnosis not present

## 2024-09-07 DIAGNOSIS — M7542 Impingement syndrome of left shoulder: Secondary | ICD-10-CM | POA: Diagnosis not present

## 2024-09-11 DIAGNOSIS — M7542 Impingement syndrome of left shoulder: Secondary | ICD-10-CM | POA: Diagnosis not present

## 2024-09-13 DIAGNOSIS — M7542 Impingement syndrome of left shoulder: Secondary | ICD-10-CM | POA: Diagnosis not present

## 2024-09-14 ENCOUNTER — Other Ambulatory Visit: Payer: Self-pay | Admitting: Medical

## 2024-09-18 DIAGNOSIS — M7542 Impingement syndrome of left shoulder: Secondary | ICD-10-CM | POA: Diagnosis not present

## 2024-09-20 ENCOUNTER — Other Ambulatory Visit: Payer: Self-pay | Admitting: Medical

## 2024-09-20 DIAGNOSIS — M7542 Impingement syndrome of left shoulder: Secondary | ICD-10-CM | POA: Diagnosis not present

## 2024-09-20 MED ORDER — ZEPBOUND 7.5 MG/0.5ML ~~LOC~~ SOLN: 7.5000 mg | SUBCUTANEOUS | 0 refills | Status: DC

## 2024-09-28 ENCOUNTER — Ambulatory Visit: Admitting: Medical

## 2024-09-28 ENCOUNTER — Other Ambulatory Visit: Payer: Self-pay | Admitting: Medical

## 2024-09-28 VITALS — BP 110/80 | HR 68 | Temp 97.8°F | Wt 215.4 lb

## 2024-09-28 DIAGNOSIS — J45909 Unspecified asthma, uncomplicated: Secondary | ICD-10-CM | POA: Insufficient documentation

## 2024-09-28 DIAGNOSIS — E785 Hyperlipidemia, unspecified: Secondary | ICD-10-CM

## 2024-09-28 DIAGNOSIS — I10 Essential (primary) hypertension: Secondary | ICD-10-CM

## 2024-09-28 DIAGNOSIS — J4541 Moderate persistent asthma with (acute) exacerbation: Secondary | ICD-10-CM

## 2024-09-28 MED ORDER — ALBUTEROL SULFATE HFA 108 (90 BASE) MCG/ACT IN AERS: 2.0000 | INHALATION_SPRAY | Freq: Four times a day (QID) | RESPIRATORY_TRACT | 1 refills | Status: AC | PRN

## 2024-09-28 MED ORDER — ZEPBOUND 10 MG/0.5ML ~~LOC~~ SOAJ: 10.0000 mg | SUBCUTANEOUS | 0 refills | Status: AC

## 2024-09-28 MED ORDER — BUDESONIDE-FORMOTEROL FUMARATE 160-4.5 MCG/ACT IN AERO: 2.0000 | INHALATION_SPRAY | Freq: Two times a day (BID) | RESPIRATORY_TRACT | 3 refills | Status: AC

## 2024-09-28 MED ORDER — PREDNISONE 10 MG PO TABS: ORAL_TABLET | ORAL | 0 refills | Status: AC

## 2024-09-28 MED ORDER — AZITHROMYCIN 250 MG PO TABS: ORAL_TABLET | ORAL | 0 refills | Status: AC

## 2024-09-28 MED ORDER — ZEPBOUND 7.5 MG/0.5ML ~~LOC~~ SOAJ: 7.5000 mg | SUBCUTANEOUS | 0 refills | Status: DC

## 2024-09-28 NOTE — Progress Notes (Signed)
 Name: Cody Tapia   Date of Visit: 09/28/24   CHIEF COMPLAINT:  Chief Complaint  Patient presents with   Acute Visit    Every November gets this cold x 1 week. Going out of town for 10 days for work and wants to make sure he has something to help him get better. Symptoms- head congestion, sneezing, chest congestion, clear phelgm, wheezing. Taking mucinex, inhaler for wheezing.     Follow-up    Needs refill on next dose 7.5mg  and 10mg  of zepbound . I have pended this       HPI:  Discussed the use of AI scribe software for clinical note transcription with the patient, who gave verbal consent to proceed.  History of Present Illness   Cody Tapia is a 58 year old male who presents with head and chest congestion.  He has been experiencing head and chest congestion for the past week, a recurrent issue every November. Symptoms include mucus drainage and a general feeling of being 'run down.' He is concerned about the persistence of these symptoms as he plans to travel out of town for ten days.  In previous years, he has required a combination of steroids and antibiotics to manage these symptoms. Currently, he is using Mucinex and an inhaler, which he believes is a generic form of Symbicort , although he is unsure of the exact name. He also uses a nebulizer with Duoneb solution and plans to continue this treatment before his trip.  He mentions having about 60 to 70 puffs left in his current inhaler and is uncertain about the availability of a fresh albuterol  inhaler. He has used albuterol  as a rescue inhaler in the past for wheezing or shortness of breath.  He has a history of using antibiotics such as doxycycline  and Z-Pak, with a preference for Z-Pak due to its quick action and fewer gastrointestinal side effects.  He is managing weight loss with Zepbound ,  having recently completed a 5 mg dose and preparing to start a 7.5 mg dose. He reports a weight loss of 11 pounds,  attributing it to a combination of medication, exercise, and dietary changes. He wants to reduce medication use, particularly for cholesterol and blood pressure, as he continues to lose weight and improve his diet. He is currently on irbesartan  for blood pressure management and rosuvastatin  for cholesterol control.  He wants to ultimately get off these medications.  Socially, he is actively exercising daily and has reduced alcohol consumption as part of his weight loss efforts.     Past Medical History:  Diagnosis Date   23-polyvalent pneumococcal polysaccharide vaccine declined 7/14   Anxiety    prior therapy with counselor, high stress job, 12+ hour work days   Asthma    ED (erectile dysfunction)    Former smoker 2021   GERD (gastroesophageal reflux disease)    pulmonology consult 2020   Hypercholesteremia    Hyperglycemia    Hypertension    Obesity    Thoracic ascending aortic aneurysm    Wears glasses    Current Outpatient Medications on File Prior to Visit  Medication Sig Dispense Refill   irbesartan  (AVAPRO ) 150 MG tablet Take 1 tablet by mouth once daily 90 tablet 3   omeprazole  (PRILOSEC) 20 MG capsule Take 1 capsule by mouth once daily 30 capsule 11   rosuvastatin  (CRESTOR ) 10 MG tablet Take 1 tablet by mouth once daily 90 tablet 0   tadalafil  (CIALIS ) 20 MG tablet TAKE 1 TABLET  BY MOUTH ONCE DAILY AS DIRECTED 8 tablet 3   ipratropium-albuterol  (DUONEB) 0.5-2.5 (3) MG/3ML SOLN Take 3 mLs by nebulization every 6 (six) hours as needed. (Patient not taking: Reported on 09/28/2024) 75 mL 1   meloxicam  (MOBIC ) 7.5 MG tablet Take 1 tablet (7.5 mg total) by mouth daily. (Patient not taking: Reported on 08/08/2024) 14 tablet 1   valACYclovir  (VALTREX ) 1000 MG tablet TAKE 2 TABLETS BY MOUTH TWICE DAILY FOR 2 DAYS FOR FLARE UP 30 tablet 0   No current facility-administered medications on file prior to visit.        OBJECTIVE:    BP 110/80   Pulse 68   Temp 97.8 F (36.6 C)    Wt 215 lb 6.4 oz (97.7 kg)   SpO2 98%   BMI 31.35 kg/m    General appearence: alert, no distress, WD/WN,  HEENT: normocephalic, sclerae anicteric, PERRLA, EOMi, nares patent, no discharge or erythema, pharynx with mild erythema  oral cavity: MMM, no lesions Neck: supple, no lymphadenopathy, no thyromegaly, no masses Heart: RRR, normal S1, S2, no murmurs Lungs: Coarse sounds,, no wheezes, rhonchi, or rales     ASSESSMENT/PLAN:   Encounter Diagnoses  Name Primary?   Moderate persistent asthmatic bronchitis with acute exacerbation Yes   Primary hypertension    Morbid obesity (HCC)    Hyperlipidemia, unspecified hyperlipidemia type    Acute upper and lower respiratory tract infection with asthma Recurrent respiratory infections with asthma, requiring proactive management due to travel plans. - Refilled Symbicort  inhaler, two puffs twice daily for 30 days. - Prescribed albuterol  inhaler for rescue use as needed. - Prescribed Z-Pak for 5 days. -Continue Mucinex the next 3 to 5 days - Prescribed prednisone  taper if symptoms worsen despite other treatment above. - Advised nebulizer use with Duoneb solution. - Encouraged adequate hydration.  Obesity Significant weight loss and appetite suppression on Zepbound , with lifestyle modifications in place. - Continue Zepbound , transition to 7.5 mg dose for 1 month, then increase to 10 mg dose. - Sent prescription for 10 mg dose, fill date December 6th. - Advised on potential side effects and risks. - Encouraged continued lifestyle modifications.  Hyperlipidemia We looked back over the last several years of lipid readings.  His medication has been working fine but when he is not on it his levels are definitely too high - Continue Crestor  10 mg daily - Advised dietary modifications to reduce cholesterol intake. - Discussed potential for future re-evaluation of medication need.  Hypertension Blood pressure managed with irbesartan ,  currently reasonable. Potential for medication adjustment with weight loss. - Continue irbesartan  for now at 150 mg daily. - consider obtaining home blood pressure cuff to monitor BP - Discussed potential for medication adjustment with significant blood pressure drop.   Cody Tapia was seen today for acute visit and follow-up.  Diagnoses and all orders for this visit:  Moderate persistent asthmatic bronchitis with acute exacerbation  Primary hypertension  Morbid obesity (HCC)  Hyperlipidemia, unspecified hyperlipidemia type  Other orders -     tirzepatide  (ZEPBOUND ) 7.5 MG/0.5ML Pen; Inject 7.5 mg into the skin once a week. -     tirzepatide  (ZEPBOUND ) 10 MG/0.5ML Pen; Inject 10 mg into the skin once a week. -     budesonide -formoterol  (SYMBICORT ) 160-4.5 MCG/ACT inhaler; Inhale 2 puffs into the lungs 2 (two) times daily. -     albuterol  (VENTOLIN  HFA) 108 (90 Base) MCG/ACT inhaler; Inhale 2 puffs into the lungs every 6 (six) hours as needed for wheezing  or shortness of breath. -     azithromycin  (ZITHROMAX ) 250 MG tablet; 2 tablets day 1, then 1 tablet days 2-4 -     predniSONE  (DELTASONE ) 10 MG tablet; 6 tablets all together day 1, 5 tablets day 2, 4 tablets day 3, 3 tablets day 4, 2 tablets day 5, 1 tablet day 6.   F/u 64mo  [facility]

## 2024-10-12 NOTE — Progress Notes (Signed)
 Cody Tapia                                          MRN: [id number]   10/12/2024   The [facility] Quality Team Specialist reviewed this patient medical record for the purposes of chart review for care gap closure. The following were reviewed: abstraction for care gap closure-controlling blood pressure.    [facility] Quality Team

## 2024-10-23 DIAGNOSIS — M7542 Impingement syndrome of left shoulder: Secondary | ICD-10-CM | POA: Diagnosis not present

## 2024-10-30 ENCOUNTER — Other Ambulatory Visit: Payer: Self-pay | Admitting: Medical

## 2024-10-30 DIAGNOSIS — E7800 Pure hypercholesterolemia, unspecified: Secondary | ICD-10-CM

## 2024-11-13 ENCOUNTER — Other Ambulatory Visit: Payer: Self-pay | Admitting: Medical

## 2024-11-24 ENCOUNTER — Telehealth: Payer: Self-pay

## 2024-11-24 MED ORDER — ZEPBOUND 12.5 MG/0.5ML ~~LOC~~ SOAJ: 12.5000 mg | SUBCUTANEOUS | 3 refills | Status: AC

## 2024-11-24 NOTE — Telephone Encounter (Signed)
 Sending to you since Ludie does not have computer access.     Copied from CRM [id number]. Topic: Clinical - Medication Question >> Nov 22, 2024  2:48 PM Santiya F wrote: Reason for CRM: Patient is calling in because he is on Zepbound  and is currently on the 10 MG and he is ready to move up to the 12.5 MG. Patient wants to know if he can get an updated prescription sent to [facility] on [address].

## 2025-08-15 ENCOUNTER — Encounter: Payer: Self-pay | Admitting: Medical
